# Patient Record
Sex: Female | Born: 1965 | Race: White | Hispanic: No | Marital: Married | Smoking: Never smoker
Health system: Southern US, Community
[De-identification: ages and names within clinical notes are randomized; demographics above are authoritative.]

## PROBLEM LIST (undated history)

## (undated) DIAGNOSIS — J45909 Unspecified asthma, uncomplicated: Secondary | ICD-10-CM

## (undated) DIAGNOSIS — R202 Paresthesia of skin: Secondary | ICD-10-CM

## (undated) DIAGNOSIS — M722 Plantar fascial fibromatosis: Secondary | ICD-10-CM

## (undated) DIAGNOSIS — K219 Gastro-esophageal reflux disease without esophagitis: Secondary | ICD-10-CM

## (undated) DIAGNOSIS — R32 Unspecified urinary incontinence: Secondary | ICD-10-CM

## (undated) DIAGNOSIS — G540 Brachial plexus disorders: Secondary | ICD-10-CM

## (undated) DIAGNOSIS — G629 Polyneuropathy, unspecified: Secondary | ICD-10-CM

## (undated) DIAGNOSIS — M1990 Unspecified osteoarthritis, unspecified site: Secondary | ICD-10-CM

## (undated) DIAGNOSIS — Z8614 Personal history of Methicillin resistant Staphylococcus aureus infection: Secondary | ICD-10-CM

## (undated) DIAGNOSIS — E785 Hyperlipidemia, unspecified: Secondary | ICD-10-CM

## (undated) DIAGNOSIS — O039 Complete or unspecified spontaneous abortion without complication: Secondary | ICD-10-CM

## (undated) DIAGNOSIS — T7840XA Allergy, unspecified, initial encounter: Secondary | ICD-10-CM

## (undated) DIAGNOSIS — I639 Cerebral infarction, unspecified: Secondary | ICD-10-CM

## (undated) HISTORY — DX: Personal history of Methicillin resistant Staphylococcus aureus infection: Z86.14

## (undated) HISTORY — DX: Unspecified asthma, uncomplicated: J45.909

## (undated) HISTORY — DX: Brachial plexus disorders: G54.0

## (undated) HISTORY — DX: Plantar fascial fibromatosis: M72.2

## (undated) HISTORY — DX: Cerebral infarction, unspecified: I63.9

## (undated) HISTORY — DX: Gastro-esophageal reflux disease without esophagitis: K21.9

## (undated) HISTORY — PX: WISDOM TOOTH EXTRACTION: SHX21

## (undated) HISTORY — DX: Unspecified osteoarthritis, unspecified site: M19.90

## (undated) HISTORY — DX: Unspecified urinary incontinence: R32

## (undated) HISTORY — DX: Paresthesia of skin: R20.2

## (undated) HISTORY — DX: Allergy, unspecified, initial encounter: T78.40XA

## (undated) HISTORY — DX: Complete or unspecified spontaneous abortion without complication: O03.9

## (undated) HISTORY — DX: Hyperlipidemia, unspecified: E78.5

---
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
## 2009-03-13 ENCOUNTER — Ambulatory Visit: Payer: Self-pay

## 2009-08-10 HISTORY — PX: DILATION AND CURETTAGE OF UTERUS: SHX78

## 2009-12-16 ENCOUNTER — Ambulatory Visit: Payer: Self-pay | Admitting: Obstetrics and Gynecology

## 2009-12-16 LAB — CONVERTED CEMR LAB
Basophils Absolute: 0.1 10*3/uL (ref 0.0–0.1)
Basophils Relative: 1 % (ref 0–1)
Hepatitis B Surface Ag: NEGATIVE
Lymphocytes Relative: 23 % (ref 12–46)
Lymphs Abs: 1.6 10*3/uL (ref 0.7–4.0)
MCHC: 32.6 g/dL (ref 30.0–36.0)
MCV: 97.4 fL (ref 78.0–100.0)
Monocytes Absolute: 0.4 10*3/uL (ref 0.1–1.0)
Monocytes Relative: 5 % (ref 3–12)
Neutro Abs: 4.7 10*3/uL (ref 1.7–7.7)
RBC: 4.25 M/uL (ref 3.87–5.11)
RDW: 13.8 % (ref 11.5–15.5)
Rh Type: NEGATIVE
Rubella: 53.8 intl units/mL — ABNORMAL HIGH
WBC: 7.2 10*3/uL (ref 4.0–10.5)

## 2009-12-23 ENCOUNTER — Ambulatory Visit (HOSPITAL_COMMUNITY): Admission: RE | Admit: 2009-12-23 | Discharge: 2009-12-23 | Payer: Self-pay | Admitting: Family Medicine

## 2009-12-25 ENCOUNTER — Ambulatory Visit: Payer: Self-pay | Admitting: Obstetrics & Gynecology

## 2009-12-27 ENCOUNTER — Ambulatory Visit (HOSPITAL_COMMUNITY): Admission: RE | Admit: 2009-12-27 | Discharge: 2009-12-27 | Payer: Self-pay | Admitting: Obstetrics & Gynecology

## 2009-12-30 ENCOUNTER — Ambulatory Visit: Payer: Self-pay | Admitting: Obstetrics and Gynecology

## 2010-01-09 ENCOUNTER — Ambulatory Visit: Payer: Self-pay | Admitting: Obstetrics and Gynecology

## 2010-01-09 ENCOUNTER — Ambulatory Visit (HOSPITAL_COMMUNITY): Admission: RE | Admit: 2010-01-09 | Discharge: 2010-01-09 | Payer: Self-pay | Admitting: Obstetrics and Gynecology

## 2010-01-13 ENCOUNTER — Ambulatory Visit: Payer: Self-pay | Admitting: Family Medicine

## 2010-01-13 ENCOUNTER — Ambulatory Visit (HOSPITAL_COMMUNITY): Admission: RE | Admit: 2010-01-13 | Discharge: 2010-01-13 | Payer: Self-pay | Admitting: Family Medicine

## 2010-02-03 ENCOUNTER — Ambulatory Visit: Payer: Self-pay | Admitting: Obstetrics and Gynecology

## 2010-03-18 ENCOUNTER — Ambulatory Visit: Payer: Self-pay | Admitting: Obstetrics and Gynecology

## 2010-10-24 ENCOUNTER — Encounter: Payer: Self-pay | Admitting: Physician Assistant

## 2010-10-27 LAB — CBC
HCT: 37.2 % (ref 36.0–46.0)
Hemoglobin: 12.9 g/dL (ref 12.0–15.0)
MCHC: 34.7 g/dL (ref 30.0–36.0)
WBC: 7 10*3/uL (ref 4.0–10.5)

## 2010-10-27 LAB — RH IMMUNE GLOB WKUP(>/=20WKS)(NOT WOMEN'S HOSP)
Antibody Screen: NEGATIVE
Fetal Screen: NEGATIVE

## 2010-11-09 ENCOUNTER — Encounter: Payer: Self-pay | Admitting: Physician Assistant

## 2010-12-23 NOTE — Assessment & Plan Note (Signed)
NAMEAXEL, MEAS                ACCOUNT NO.:  [id number]   MEDICAL RECORD NO.:  [id number]          PATIENT TYPE:  POB   LOCATION:  [facility]:  Andrew Au, MD        DATE OF BIRTH:  [birth year]   DATE OF SERVICE:  12/30/2009                                  CLINIC NOTE   The patient is a 45 year old Caucasian female gravida 2, para 0-0-1-0  who had a termination with her first pregnancy, came in on May 8 for her  initial OB visit and the ultrasound showed no cardiac motion, need to  follow up.  On May 20, she repeated the ultrasound and was sent home  without the technician having notified the doctor on-call who was  present in the hospital that this patient had an abnormal ultrasound and  an obvious fetal demise.  The patient by dates should have been 8+  weeks.  The fetus appeared to be 6 weeks 5 days with no cardiac activity  on the second followup.  The patient came in today, we saw her, and  discussed her options with her.  I have given her the 3 options of a D  and C, Cytotec, or waiting.  I told her the risks and disadvantages of  all of those and this was a significant surprise, I think to the couple,  so they wanted some time to think about their options.  I have described  the Cytotec how to take it.  I have given her that.  I have also since  she want to make absolute sure that we were certain, I have had them  draw a quantitative beta HCG and if she wants another followup in 48  hours, we will get that for her since she may need more convincing.  On  the other hand in the privacy at her home, will be able to make their  own decision and if she calls and decides on a D and C, we will go ahead  and get that scheduled.   IMPRESSION:  Missed abortion approximately 6 weeks 5 days.  Optional  treatment discussed with the patient along with possible complications  involved.           ______________________________  Andrew Au, MD     PR/MEDQ  D:  12/30/2009  T:  12/31/2009  Job:  [id number]

## 2011-08-11 DIAGNOSIS — O039 Complete or unspecified spontaneous abortion without complication: Secondary | ICD-10-CM

## 2011-08-11 HISTORY — DX: Complete or unspecified spontaneous abortion without complication: O03.9

## 2012-01-20 ENCOUNTER — Ambulatory Visit: Payer: Self-pay | Admitting: Obstetrics and Gynecology

## 2013-04-04 ENCOUNTER — Ambulatory Visit: Payer: Self-pay | Admitting: General Practice

## 2014-02-19 ENCOUNTER — Ambulatory Visit: Payer: Self-pay | Admitting: General Practice

## 2014-04-05 ENCOUNTER — Ambulatory Visit: Payer: Self-pay | Admitting: General Practice

## 2015-06-09 ENCOUNTER — Other Ambulatory Visit: Payer: Self-pay | Admitting: Physician Assistant

## 2015-06-12 ENCOUNTER — Telehealth: Payer: Self-pay | Admitting: Physician Assistant

## 2015-06-12 DIAGNOSIS — J302 Other seasonal allergic rhinitis: Secondary | ICD-10-CM

## 2015-06-12 MED ORDER — MONTELUKAST SODIUM 10 MG PO TABS: 10.0000 mg | ORAL_TABLET | Freq: Every day | ORAL | Status: DC

## 2015-06-12 NOTE — Telephone Encounter (Signed)
Pt called and needs med refill for singulair, approved and sent to [facility]

## 2015-08-01 ENCOUNTER — Encounter: Payer: Self-pay | Admitting: Physician Assistant

## 2015-08-01 ENCOUNTER — Ambulatory Visit: Payer: Self-pay | Admitting: Physician Assistant

## 2015-08-01 VITALS — BP 119/70 | HR 96 | Temp 98.3°F

## 2015-08-01 DIAGNOSIS — J069 Acute upper respiratory infection, unspecified: Secondary | ICD-10-CM

## 2015-08-01 MED ORDER — AZITHROMYCIN 250 MG PO TABS: ORAL_TABLET | ORAL | Status: DC

## 2015-08-01 NOTE — Progress Notes (Signed)
S: C/o runny nose and congestion for 6 days, no fever, chills, cp/sob, v/d; mucus was green this am but clear throughout the day, cough is sporadic, deep and burns in chest, cough is worse at night, husband has same sx  Using albuterol, advair, zyrtec without relief  O: PE: perrl eomi, normocephalic, tms dull, nasal mucosa red and swollen, throat injected, neck supple no lymph, lungs c t a, cv rrr, neuro intact  A:  Acute  uri   P: zpack, tussionex 131m nr; drink fluids, continue regular meds , use otc meds of choice, return if not improving in 5 days, return earlier if worsening

## 2015-08-09 ENCOUNTER — Encounter: Payer: Self-pay | Admitting: Physician Assistant

## 2015-08-09 ENCOUNTER — Ambulatory Visit: Payer: Self-pay | Admitting: Physician Assistant

## 2015-08-09 VITALS — BP 119/70 | HR 73 | Temp 98.1°F

## 2015-08-09 DIAGNOSIS — J069 Acute upper respiratory infection, unspecified: Secondary | ICD-10-CM

## 2015-08-09 MED ORDER — AMOXICILLIN 875 MG PO TABS: 875.0000 mg | ORAL_TABLET | Freq: Two times a day (BID) | ORAL | Status: DC

## 2015-08-09 MED ORDER — FLUCONAZOLE 150 MG PO TABS: 150.0000 mg | ORAL_TABLET | Freq: Once | ORAL | Status: DC

## 2015-08-09 NOTE — Progress Notes (Signed)
S: continued cough, states was feeling a lot better , finished zpack few days ago, mucus is getting more clear, no fever/chills, cp/sob; states feels better today than she did yesterday  O: vitals wnl, nad, lungs c t a, cv rrr  A: continue uri  P: reassurance, if worsening get amoxil filled

## 2015-11-26 ENCOUNTER — Encounter: Payer: Self-pay | Admitting: Physician Assistant

## 2015-11-26 ENCOUNTER — Ambulatory Visit: Payer: Self-pay | Admitting: Physician Assistant

## 2015-11-26 VITALS — BP 119/80 | HR 65 | Temp 98.7°F

## 2015-11-26 DIAGNOSIS — R509 Fever, unspecified: Secondary | ICD-10-CM

## 2015-11-26 DIAGNOSIS — R062 Wheezing: Secondary | ICD-10-CM

## 2015-11-26 DIAGNOSIS — J209 Acute bronchitis, unspecified: Secondary | ICD-10-CM

## 2015-11-26 LAB — POCT INFLUENZA A/B
INFLUENZA A, POC: NEGATIVE
INFLUENZA B, POC: NEGATIVE

## 2015-11-26 MED ORDER — AZITHROMYCIN 250 MG PO TABS: ORAL_TABLET | ORAL | Status: DC

## 2015-11-26 MED ORDER — FLUTICASONE-SALMETEROL 115-21 MCG/ACT IN AERO: 2.0000 | INHALATION_SPRAY | Freq: Two times a day (BID) | RESPIRATORY_TRACT | Status: DC

## 2015-11-26 MED ORDER — IPRATROPIUM-ALBUTEROL 0.5-2.5 (3) MG/3ML IN SOLN
3.0000 mL | Freq: Once | RESPIRATORY_TRACT | Status: AC
  Administered 2015-11-26: 3 mL via RESPIRATORY_TRACT

## 2015-11-26 MED ORDER — METHYLPREDNISOLONE 4 MG PO TBPK: ORAL_TABLET | ORAL | Status: DC

## 2015-11-26 MED ORDER — ALBUTEROL SULFATE HFA 108 (90 BASE) MCG/ACT IN AERS: 2.0000 | INHALATION_SPRAY | RESPIRATORY_TRACT | Status: DC | PRN

## 2015-11-26 NOTE — Progress Notes (Signed)
S: C/o cough and congestion with wheezing and chest pain, chest is sore from coughing, did have fever, chills on sunday, mucus is green,  keeping pt awake at night;  denies cardiac type chest pain or sob, v/d, abd pain Remainder ros neg  O: vitals wnl, nad, tms clear, throat injected, neck supple no lymph, lungs with wheezing, cv rrr, neuro intact, cough is congested; svn duoneb given, lungs c t a  A:  Acute bronchitis   P:  rx medication: zpack, medrol dose pack, refill on albuterol and advair, use otc meds, tylenol or motrin as needed for fever/chills, return if not better in 3 -5 days, return earlier if worsening

## 2016-04-28 ENCOUNTER — Ambulatory Visit: Payer: Self-pay | Admitting: Physician Assistant

## 2016-04-28 ENCOUNTER — Encounter: Payer: Self-pay | Admitting: Physician Assistant

## 2016-04-28 VITALS — BP 119/70 | HR 85 | Temp 98.4°F

## 2016-04-28 DIAGNOSIS — L259 Unspecified contact dermatitis, unspecified cause: Secondary | ICD-10-CM

## 2016-04-28 MED ORDER — METHYLPREDNISOLONE 4 MG PO TBPK: ORAL_TABLET | ORAL | 0 refills | Status: DC

## 2016-04-28 NOTE — Progress Notes (Signed)
S: c/o rash on left forearm, itchy, x 4 weeks, area is still itching, unsure why, no pustules or drainage, no new exposures  O: vitals wnl, nad, skin with deep scratch mark type wounds, some redness from scratching, no pustules, drainage, or vesicles, no blisters, n/v intact  A: itching, ?contact dermatitis  P: medrol dose pack F/u with dermatology if not better in 5 days

## 2016-05-27 ENCOUNTER — Other Ambulatory Visit: Payer: Self-pay | Admitting: Physician Assistant

## 2016-05-27 DIAGNOSIS — J302 Other seasonal allergic rhinitis: Secondary | ICD-10-CM

## 2016-06-08 ENCOUNTER — Other Ambulatory Visit: Payer: Self-pay | Admitting: Emergency Medicine

## 2016-06-08 MED ORDER — FLUTICASONE-SALMETEROL 45-21 MCG/ACT IN AERO: 2.0000 | INHALATION_SPRAY | Freq: Two times a day (BID) | RESPIRATORY_TRACT | 12 refills | Status: DC

## 2016-09-01 ENCOUNTER — Ambulatory Visit: Payer: Self-pay | Admitting: Physician Assistant

## 2016-09-14 ENCOUNTER — Ambulatory Visit (INDEPENDENT_AMBULATORY_CARE_PROVIDER_SITE_OTHER): Payer: Managed Care, Other (non HMO) | Admitting: Family

## 2016-09-14 ENCOUNTER — Encounter: Payer: Self-pay | Admitting: Family

## 2016-09-14 DIAGNOSIS — J4520 Mild intermittent asthma, uncomplicated: Secondary | ICD-10-CM | POA: Diagnosis not present

## 2016-09-14 DIAGNOSIS — E663 Overweight: Secondary | ICD-10-CM

## 2016-09-14 DIAGNOSIS — J45909 Unspecified asthma, uncomplicated: Secondary | ICD-10-CM | POA: Insufficient documentation

## 2016-09-14 NOTE — Progress Notes (Signed)
Subjective:    Patient ID: Denise Gamble, female    DOB: 04/01/1966, 51 y.o.   MRN: [id number]  CC: Denise Gamble is a 51 y.o. female who presents today to establish care.    HPI: Prior care had been with employee health through work.   Asthma- stable. has done allergy shots with ENT in the past; on singulair QD; using inhalers prn; diagnosed as adult  Remains frustrated by her weight. Regular exercise. Trying to eat more clean.     HISTORY:  Past Medical History:  Diagnosis Date  . Asthma   . Miscarriage 2013   multiple    Past Surgical History:  Procedure Laterality Date  . LYMPHADENECTOMY     Family History  Problem Relation Age of Onset  . Heart disease Mother 17    epstein anolmay   . Colon cancer Father 2  . Heart disease Brother     Allergies: Patient has no known allergies. Current Outpatient Prescriptions on File Prior to Visit  Medication Sig Dispense Refill  . albuterol (PROAIR HFA) 108 (90 Base) MCG/ACT inhaler Inhale 2 puffs into the lungs every 4 (four) hours as needed for wheezing or shortness of breath. 1 Inhaler 12  . cetirizine (ZYRTEC) 10 MG tablet Take 10 mg by mouth.    . fluticasone-salmeterol (ADVAIR HFA) 115-21 MCG/ACT inhaler Inhale 2 puffs into the lungs 2 (two) times daily. 1 Inhaler 12  . fluticasone-salmeterol (ADVAIR HFA) 45-21 MCG/ACT inhaler Inhale 2 puffs into the lungs 2 (two) times daily. 1 Inhaler 12  . montelukast (SINGULAIR) 10 MG tablet TAKE ONE TABLET BY MOUTH AT BEDTIME 30 tablet 11   No current facility-administered medications on file prior to visit.     Social History  Substance Use Topics  . Smoking status: Unknown If Ever Smoked  . Smokeless tobacco: Never Used  . Alcohol use No    Review of Systems  Constitutional: Negative for chills and fever.  Respiratory: Negative for cough, shortness of breath and wheezing.   Cardiovascular: Negative for chest pain and palpitations.  Gastrointestinal: Negative for  nausea and vomiting.      Objective:    BP 104/68   Pulse 93   Temp 98 F (36.7 C) (Oral)   Wt 197 lb 3.2 oz (89.4 kg)  BP Readings from Last 3 Encounters:  09/14/16 104/68  04/28/16 119/70  11/26/15 119/80   Wt Readings from Last 3 Encounters:  09/14/16 197 lb 3.2 oz (89.4 kg)    Physical Exam  Constitutional: She appears well-developed and well-nourished.  Eyes: Conjunctivae are normal.  Cardiovascular: Normal rate, regular rhythm, normal heart sounds and normal pulses.   Pulmonary/Chest: Effort normal and breath sounds normal. She has no wheezes. She has no rhonchi. She has no rales.  Neurological: She is alert.  Skin: Skin is warm and dry.  Psychiatric: She has a normal mood and affect. Her speech is normal and behavior is normal. Thought content normal.  Vitals reviewed.      Assessment & Plan:   Problem List Items Addressed This Visit      Respiratory   Asthma    Stable.         Other   Overweight    Discussed lifestyle medications. Information provided. Will follow and do fasting labwork when she returns for cpe.           I have discontinued Ms. Kinzler's azithromycin and methylPREDNISolone. I am also having her maintain her cetirizine, albuterol,  fluticasone-salmeterol, montelukast, and fluticasone-salmeterol.   No orders of the defined types were placed in this encounter.   Return precautions given.   Risks, benefits, and alternatives of the medications and treatment plan prescribed today were discussed, and patient expressed understanding.   Education regarding symptom management and diagnosis given to patient on AVS.  Continue to follow with Mable Paris, FNP for routine health maintenance.   Denise Gamble and I agreed with plan.   Mable Paris, FNP

## 2016-09-14 NOTE — Patient Instructions (Signed)
Pleasure meeting you     This is  Dr. Lupita Dawn  example of a  "Low GI"  Diet:  It will allow you to lose 4 to 8  lbs  per month if you follow it carefully.  Your goal with exercise is a minimum of 30 minutes of aerobic exercise 5 days per week (Walking does not count once it becomes easy!)    All of the foods can be found at grocery stores and in bulk at [employer].  The Atkins protein bars and shakes are available in more varieties at Target, [employer] and [place].     7 AM Breakfast:  Choose from the following:  Low carbohydrate Protein  Shakes (I recommend the  Premier Protein chocolate shakes,  EAS AdvantEdge "Carb Control" shakes  Or the Atkins shakes all are under 3 net carbs)     a scrambled egg/bacon/cheese burrito made with Mission's "carb balance" whole wheat tortilla  (about 10 net carbs )  Regulatory affairs officer (basically a quiche without the pastry crust) that is eaten cold and very convenient way to get your eggs.  8 carbs)  If you make your own protein shakes, avoid bananas and pineapple,  And use low carb greek yogurt or original /unsweetened almond or soy milk    Avoid cereal and bananas, oatmeal and cream of wheat and grits. They are loaded with carbohydrates!   10 AM: high protein snack:  Protein bar by Atkins (the snack size, under 200 cal, usually < 6 net carbs).    A stick of cheese:  Around 1 carb,  100 cal     Dannon Light n Fit [place] Yogurt  (80 cal, 8 carbs)  Other so called "protein bars" and Greek yogurts tend to be loaded with carbohydrates.  Remember, in food advertising, the word "energy" is synonymous for " carbohydrate."  Lunch:   A Sandwich using the bread choices listed, Can use any  Eggs,  lunchmeat, grilled meat or canned tuna), avocado, regular mayo/mustard  and cheese.  A Salad using blue cheese, ranch,  Goddess or vinagrette,  Avoid taco shells, croutons or "confetti" and no "candied nuts" but regular nuts OK.   No pretzels, nabs   or chips.  Pickles and miniature sweet peppers are a good low carb alternative that provide a "crunch"  The bread is the only source of carbohydrate in a sandwich and  can be decreased by trying some of the attached alternatives to traditional loaf bread   Avoid "Low fat dressings, as well as [place] dressings They are loaded with sugar!   3 PM/ Mid day  Snack:  Consider  1 ounce of  almonds, walnuts, pistachios, pecans, peanuts,  Macadamia nuts or a nut medley.  Avoid "granola and granola bars "  Mixed nuts are ok in moderation as long as there are no raisins,  cranberries or dried fruit.   KIND bars are OK if you get the low glycemic index variety   Try the prosciutto/mozzarella cheese sticks by F[employer]  In deli /backery section   High protein      6 PM  Dinner:     Meat/fowl/fish with a green salad, and either broccoli, cauliflower, green beans, spinach, brussel sprouts or  Lima beans. DO NOT BREAD THE PROTEIN!!      There is a low carb pasta by Dreamfield's that is acceptable and tastes great: only 5 digestible carbs/serving.( All grocery stores but BJs carry  it ) Several ready made meals are available low carb:   Try Michel Angelo's chicken piccata or chicken or eggplant parm over low carb pasta.(Lowes and BJs)   Marjory Lies Sanchez's "Carnitas" (pulled pork, no sauce,  0 carbs) or his beef pot roast to make a dinner burrito (at BJ's)  Pesto over low carb pasta (bj's sells a good quality pesto in the center refrigerated section of the deli   Try satueeing  Cheral Marker with mushroooms as a good side   [employer] Giant makes a mashed cauliflower that tastes like mashed potatoes  Whole wheat pasta is still full of digestible carbs and  Not as low in glycemic index as Dreamfield's.   Brown rice is still rice,  So skip the rice and noodles if you eat [place] or [place] (or at least limit to 1/2 cup)  9 PM snack :   Breyer's "low carb" fudgsicle or  ice cream bar (Carb Smart line),  or  Weight Watcher's ice cream bar , or another "no sugar added" ice cream;  a serving of fresh berries/cherries with whipped cream   Cheese or DANNON'S LlGHT N FIT GREEK YOGURT  8 ounces of [place] unsweetened almond/cococunut milk    Treat yourself to a parfait made with whipped cream blueberiies, walnuts and vanilla greek yogurt  Avoid bananas, pineapple, grapes  and watermelon on a regular basis because they are high in sugar.  THINK OF THEM AS DESSERT  Remember that snack Substitutions should be less than 10 NET carbs per serving and meals < 20 carbs. Remember to subtract fiber grams to get the "net carbs."

## 2016-09-15 ENCOUNTER — Encounter: Payer: Self-pay | Admitting: Family

## 2016-09-15 DIAGNOSIS — E663 Overweight: Secondary | ICD-10-CM | POA: Insufficient documentation

## 2016-09-15 NOTE — Assessment & Plan Note (Signed)
Discussed lifestyle medications. Information provided. Will follow and do fasting labwork when she returns for cpe.

## 2016-09-15 NOTE — Assessment & Plan Note (Signed)
Stable

## 2016-09-15 NOTE — Assessment & Plan Note (Signed)
>>  ASSESSMENT AND PLAN FOR OVERWEIGHT WRITTEN ON 09/15/2016  7:44 AM BY ARNETT, Lyn Records, FNP  Discussed lifestyle medications. Information provided. Will follow and do fasting labwork when she returns for cpe.

## 2016-11-05 ENCOUNTER — Ambulatory Visit: Payer: Self-pay | Admitting: Physician Assistant

## 2016-11-05 ENCOUNTER — Encounter: Payer: Self-pay | Admitting: Physician Assistant

## 2016-11-05 VITALS — BP 110/70 | HR 85 | Temp 98.4°F

## 2016-11-05 DIAGNOSIS — M722 Plantar fascial fibromatosis: Secondary | ICD-10-CM

## 2016-11-05 NOTE — Progress Notes (Signed)
S: c/o left foot pain, no known injury, pain started yesterday, hurts to stand after sitting for awhile, no swelling or redness  O: vitals wnl, nad, left foot tender along heel, full rom, skin intact, no redness or swelling, no fb noted, n/v intact  A: plantar fasciitis  P: otc advil, ice, stretches, wear shoes, do not go barefoot or wear flip flops, discussed proper shoe support

## 2016-11-27 ENCOUNTER — Encounter: Payer: Self-pay | Admitting: Podiatry

## 2016-11-27 ENCOUNTER — Ambulatory Visit (INDEPENDENT_AMBULATORY_CARE_PROVIDER_SITE_OTHER): Payer: Managed Care, Other (non HMO) | Admitting: Podiatry

## 2016-11-27 DIAGNOSIS — M79672 Pain in left foot: Secondary | ICD-10-CM | POA: Diagnosis not present

## 2016-11-27 DIAGNOSIS — M722 Plantar fascial fibromatosis: Secondary | ICD-10-CM

## 2016-11-27 MED ORDER — MELOXICAM 15 MG PO TABS: 15.0000 mg | ORAL_TABLET | Freq: Every day | ORAL | 0 refills | Status: DC

## 2016-11-29 NOTE — Progress Notes (Signed)
   Subjective: Patient presents today for stabbing pain and tenderness in the plantar aspect of the left foot and posterior heel. Patient states the foot pain has been hurting for about 4 weeks now. Patient states that it hurts in the mornings with the first steps out of bed. He also reports intermittent pain of the left ankle secondary to a sprain about two years ago. He also complains of numbness of the left great toe as well and reports h/o gout in that toe. Patient presents today for further treatment and evaluation  Objective: Physical Exam General: The patient is alert and oriented x3 in no acute distress.  Dermatology: Skin is warm, dry and supple bilateral lower extremities. Negative for open lesions or macerations bilateral.   Vascular: Dorsalis Pedis and Posterior Tibial pulses palpable bilateral.  Capillary fill time is immediate to all digits.  Neurological: Epicritic and protective threshold intact bilateral.   Musculoskeletal: Tenderness to palpation at the medial calcaneal tubercale and through the insertion of the plantar fascia of the left foot. All other joints range of motion within normal limits bilateral. Strength 5/5 in all groups bilateral.    Assessment: 1. Plantar fasciitis left foot 2. Pain in left foot 3. h/o gout-left great toe 4. h/o left ankle sprain two years ago  Plan of Care:   1. Patient evaluated. Xrays reviewed.   2. Injection of 0.5cc Celestone soluspan injected into the left plantar fascia.  3. Instructed patient regarding therapies and modalities at home to alleviate symptoms.  4. Rx for meloxicam 81m PO given to patient.  5. Plantar fascial band(s) dispensed. 6. Return to clinic in 4 weeks.     BEdrick Kins DPM [facility]  Dr. BEdrick Kins DAvinger                                       [place] [address]               Office [phone] Fax [phone]

## 2016-12-06 MED ORDER — BETAMETHASONE SOD PHOS & ACET 6 (3-3) MG/ML IJ SUSP: 3.0000 mg | Freq: Once | INTRAMUSCULAR | Status: DC

## 2016-12-14 ENCOUNTER — Encounter: Payer: Self-pay | Admitting: Family

## 2016-12-14 ENCOUNTER — Other Ambulatory Visit (HOSPITAL_COMMUNITY)
Admission: RE | Admit: 2016-12-14 | Discharge: 2016-12-14 | Disposition: A | Discharge status: Home / Self Care | Payer: Managed Care, Other (non HMO) | Source: Ambulatory Visit | Attending: Family | Admitting: Family

## 2016-12-14 ENCOUNTER — Ambulatory Visit (INDEPENDENT_AMBULATORY_CARE_PROVIDER_SITE_OTHER): Payer: Managed Care, Other (non HMO) | Admitting: Family

## 2016-12-14 VITALS — BP 118/80 | HR 79 | Temp 98.1°F | Wt 202.2 lb

## 2016-12-14 DIAGNOSIS — Z0000 Encounter for general adult medical examination without abnormal findings: Secondary | ICD-10-CM | POA: Diagnosis present

## 2016-12-14 DIAGNOSIS — G6289 Other specified polyneuropathies: Secondary | ICD-10-CM

## 2016-12-14 DIAGNOSIS — G629 Polyneuropathy, unspecified: Secondary | ICD-10-CM | POA: Insufficient documentation

## 2016-12-14 LAB — CBC WITH DIFFERENTIAL/PLATELET
BASOS ABS: 0.1 10*3/uL (ref 0.0–0.1)
Basophils Relative: 1 % (ref 0.0–3.0)
EOS ABS: 0.2 10*3/uL (ref 0.0–0.7)
Eosinophils Relative: 2.5 % (ref 0.0–5.0)
HCT: 40 % (ref 36.0–46.0)
HEMOGLOBIN: 13.2 g/dL (ref 12.0–15.0)
Lymphocytes Relative: 21.2 % (ref 12.0–46.0)
Lymphs Abs: 1.3 10*3/uL (ref 0.7–4.0)
MCHC: 33 g/dL (ref 30.0–36.0)
MCV: 98.3 fl (ref 78.0–100.0)
MONO ABS: 0.4 10*3/uL (ref 0.1–1.0)
Monocytes Relative: 6.5 % (ref 3.0–12.0)
Neutro Abs: 4.3 10*3/uL (ref 1.4–7.7)
Neutrophils Relative %: 68.8 % (ref 43.0–77.0)
Platelets: 229 10*3/uL (ref 150.0–400.0)
RBC: 4.07 Mil/uL (ref 3.87–5.11)
RDW: 13.7 % (ref 11.5–15.5)
WBC: 6.3 10*3/uL (ref 4.0–10.5)

## 2016-12-14 LAB — COMPREHENSIVE METABOLIC PANEL
ALBUMIN: 4.2 g/dL (ref 3.5–5.2)
ALT: 14 U/L (ref 0–35)
AST: 18 U/L (ref 0–37)
Alkaline Phosphatase: 52 U/L (ref 39–117)
BILIRUBIN TOTAL: 0.6 mg/dL (ref 0.2–1.2)
BUN: 13 mg/dL (ref 6–23)
CALCIUM: 9.4 mg/dL (ref 8.4–10.5)
CO2: 28 meq/L (ref 19–32)
Chloride: 105 mEq/L (ref 96–112)
Creatinine, Ser: 0.84 mg/dL (ref 0.40–1.20)
GFR: 76.7 mL/min (ref >60.00)
Glucose, Bld: 102 mg/dL — ABNORMAL HIGH (ref 70–99)
Potassium: 4.6 mEq/L (ref 3.5–5.1)
SODIUM: 139 meq/L (ref 135–145)
Total Protein: 7 g/dL (ref 6.0–8.3)

## 2016-12-14 LAB — LIPID PANEL
Cholesterol: 195 mg/dL (ref 0–200)
HDL: 57.2 mg/dL (ref >39.00)
LDL CALC: 123 mg/dL — AB (ref 0–99)
NONHDL: 137.87
Total CHOL/HDL Ratio: 3
Triglycerides: 72 mg/dL (ref 0.0–149.0)
VLDL: 14.4 mg/dL (ref 0.0–40.0)

## 2016-12-14 LAB — B12 AND FOLATE PANEL: Vitamin B-12: 1004 pg/mL — ABNORMAL HIGH (ref 211–911)

## 2016-12-14 LAB — HEMOGLOBIN A1C: HEMOGLOBIN A1C: 5.3 % (ref 4.6–6.5)

## 2016-12-14 LAB — TSH: TSH: 2.22 u[IU]/mL (ref 0.35–4.50)

## 2016-12-14 LAB — VITAMIN D 25 HYDROXY (VIT D DEFICIENCY, FRACTURES): VITD: 43.74 ng/mL (ref 30.00–100.00)

## 2016-12-14 MED ORDER — PNEUMOCOCCAL VAC POLYVALENT 25 MCG/0.5ML IJ INJ
0.5000 mL | INJECTION | Freq: Once | INTRAMUSCULAR | Status: AC
  Administered 2016-12-14: 0.5 mL via INTRAMUSCULAR

## 2016-12-14 NOTE — Progress Notes (Signed)
Pre visit review using our clinic review tool, if applicable. No additional management support is needed unless otherwise documented below in the visit note. 

## 2016-12-14 NOTE — Progress Notes (Signed)
Subjective:    Patient ID: Denise Gamble, female    DOB: 12-29-1965, 51 y.o.   MRN: [id number]  CC: Denise Gamble is a 51 y.o. female who presents today for physical exam.    HPI: Overall feeling well.    Left big toe numbness for past 6 weeks, improving.No triggers. Hasn't tried any medication.  Following with podiatry  For plantar fascitis. No pain in calves with ambulation. Notes drinking red bull with b6 daily.       Colorectal Cancer Screening: No early family history. Father had colon cancer at older age. Breast Cancer Screening: Mammogram due Cervical Cancer Screening: due Bone Health screening/DEXA for 65+: No increased fracture risk. Defer screening at this time. Lung Cancer Screening: Doesn't have 30 year pack year history and age > 63 years.       Tetanus - due        Pneumococcal - Candidate for.   Labs: Screening labs today. Exercise: Gets regular exercise.  Alcohol use: none Smoking/tobacco use: Nonsmoker.  Regular dental exams: UTD Wears seat belt: Yes. Skin: no new lesions. No h/o skin cancer; no new lesions.    HISTORY:  Past Medical History:  Diagnosis Date  . Asthma   . Miscarriage 2013   multiple   . Thoracic outlet syndrome     Past Surgical History:  Procedure Laterality Date  . LYMPHADENECTOMY     Family History  Problem Relation Age of Onset  . Heart disease Mother 61    epstein anolmay   . Colon cancer Father 61  . Skin cancer Father     ? thinks melanoma?   . Leukemia Father     AAL  . Heart disease Brother   . Coronary artery disease Sister       ALLERGIES: Patient has no known allergies.  Current Outpatient Prescriptions on File Prior to Visit  Medication Sig Dispense Refill  . albuterol (PROAIR HFA) 108 (90 Base) MCG/ACT inhaler Inhale 2 puffs into the lungs every 4 (four) hours as needed for wheezing or shortness of breath. 1 Inhaler 12  . cetirizine (ZYRTEC) 10 MG tablet Take 10 mg by mouth.    .  fluticasone-salmeterol (ADVAIR HFA) 45-21 MCG/ACT inhaler Inhale 2 puffs into the lungs 2 (two) times daily. 1 Inhaler 12  . meloxicam (MOBIC) 15 MG tablet Take 1 tablet (15 mg total) by mouth daily. 30 tablet 0  . montelukast (SINGULAIR) 10 MG tablet TAKE ONE TABLET BY MOUTH AT BEDTIME 30 tablet 11   Current Facility-Administered Medications on File Prior to Visit  Medication Dose Route Frequency Provider Last Rate Last Dose  . betamethasone acetate-betamethasone sodium phosphate (CELESTONE) injection 3 mg  3 mg Intramuscular Once Edrick Kins, DPM        Social History  Substance Use Topics  . Smoking status: Never Smoker  . Smokeless tobacco: Never Used  . Alcohol use No    Review of Systems  Constitutional: Negative for chills, fever and unexpected weight change.  HENT: Negative for congestion.   Respiratory: Negative for cough.   Cardiovascular: Negative for chest pain, palpitations and leg swelling.  Gastrointestinal: Negative for nausea and vomiting.  Musculoskeletal: Negative for arthralgias and myalgias.  Skin: Negative for rash.  Neurological: Positive for numbness. Negative for headaches.  Hematological: Negative for adenopathy.  Psychiatric/Behavioral: Negative for confusion.      Objective:    BP 118/80   Pulse 79   Temp 98.1 F (36.7 C) (Oral)  Wt 202 lb 3.2 oz (91.7 kg)   SpO2 98%   BP Readings from Last 3 Encounters:  12/14/16 118/80  11/05/16 110/70  09/14/16 104/68   Wt Readings from Last 3 Encounters:  12/14/16 202 lb 3.2 oz (91.7 kg)  09/14/16 197 lb 3.2 oz (89.4 kg)    Physical Exam  Constitutional: She appears well-developed and well-nourished.  Eyes: Conjunctivae are normal.  Neck: No thyroid mass and no thyromegaly present.  Cardiovascular: Normal rate, regular rhythm, normal heart sounds and normal pulses.   Pulmonary/Chest: Effort normal and breath sounds normal. She has no wheezes. She has no rhonchi. She has no rales. Right breast  exhibits no inverted nipple, no mass, no nipple discharge, no skin change and no tenderness. Left breast exhibits no inverted nipple, no mass, no nipple discharge, no skin change and no tenderness. Breasts are symmetrical.  No masses or asymmetry appreciated during CBE.  Genitourinary: Uterus is not enlarged, not fixed and not tender. Cervix exhibits no motion tenderness, no discharge and no friability. Right adnexum displays no mass, no tenderness and no fullness. Left adnexum displays no mass, no tenderness and no fullness.  Genitourinary Comments: Pap performed. No CMT. Unable to appreciated ovaries.  Lymphadenopathy:       Head (right side): No submental, no submandibular, no tonsillar, no preauricular, no posterior auricular and no occipital adenopathy present.       Head (left side): No submental, no submandibular, no tonsillar, no preauricular, no posterior auricular and no occipital adenopathy present.       Right cervical: No superficial cervical, no deep cervical and no posterior cervical adenopathy present.      Left cervical: No superficial cervical, no deep cervical and no posterior cervical adenopathy present.    She has no axillary adenopathy.       Right axillary: No pectoral and no lateral adenopathy present.       Left axillary: No pectoral and no lateral adenopathy present. Neurological: She is alert.  Skin: Skin is warm and dry.  Psychiatric: She has a normal mood and affect. Her speech is normal and behavior is normal. Thought content normal.  Vitals reviewed.      Assessment & Plan:   Problem List Items Addressed This Visit      Nervous and Auditory   Peripheral neuropathy    Discussed peripheral neuropathy of left great toe, pending additional lab studies vitamin deficiency or toxicity of B12 due to her intake of red bull.        Other   Routine physical examination - Primary    Pap and clinical breast exam performed today. Due to family history of colon cancer,  patient I jointly agreed referral for colonoscopy was appropriate. Pneumococcal vaccine given due to history of asthma. Advised tdap vaccine a local pharmacy. Encouraged exercise.       Relevant Medications   pneumococcal 23 valent vaccine (PNU-IMMUNE) injection 0.5 mL (Completed)   Other Relevant Orders   CBC with Differential/Platelet   Comprehensive metabolic panel   Hemoglobin A1c   Lipid panel   Cytology - PAP   TSH   VITAMIN D 25 Hydroxy (Vit-D Deficiency, Fractures)   Ambulatory referral to Gastroenterology   B12 and Folate Panel   Vitamin B6   MM DIGITAL SCREENING BILATERAL       I am having Ms. Abdelaziz maintain her cetirizine, albuterol, montelukast, fluticasone-salmeterol, and meloxicam. We administered pneumococcal 23 valent vaccine. We will continue to administer betamethasone acetate-betamethasone sodium phosphate.  Meds ordered this encounter  Medications  . pneumococcal 23 valent vaccine (PNU-IMMUNE) injection 0.5 mL    Return precautions given.   Risks, benefits, and alternatives of the medications and treatment plan prescribed today were discussed, and patient expressed understanding.   Education regarding symptom management and diagnosis given to patient on AVS.   Continue to follow with Burnard Hawthorne, FNP for routine health maintenance.   Denise Gamble and I agreed with plan.   Mable Paris, FNP

## 2016-12-14 NOTE — Patient Instructions (Addendum)
Labs  tdap local pharmacy  We placed a referral. Mammogram this year. I asked that you call one the below locations and schedule this when it is convenient for you.   If you have dense breasts, you may ask for 3D mammogram over the traditional 2D mammogram as new evidence suggest 3D is superior. Please note that NOT all insurance companies cover 3D and you may have to pay a higher copay. You may call your insurance company to further clarify your benefits.   Options for [place], [place]  * [facility] [place] [place] * Note if you ask for 3D mammogram at this location, you must request [place], [place] location*     Health Maintenance, Female Adopting a healthy lifestyle and getting preventive care can go a long way to promote health and wellness. Talk with your health care provider about what schedule of regular examinations is right for you. This is a good chance for you to check in with your provider about disease prevention and staying healthy. In between checkups, there are plenty of things you can do on your own. Experts have done a lot of research about which lifestyle changes and preventive measures are most likely to keep you healthy. Ask your health care provider for more information. Weight and diet Eat a healthy diet  Be sure to include plenty of vegetables, fruits, low-fat dairy products, and lean protein.  Do not eat a lot of foods high in solid fats, added sugars, or salt.  Get regular exercise. This is one of the most important things you can do for your health.  Most adults should exercise for at least 150 minutes each week. The exercise should increase your heart rate and make you sweat (moderate-intensity exercise).  Most adults should also do strengthening exercises at least twice a week. This is in  addition to the moderate-intensity exercise. Maintain a healthy weight  Body mass index (BMI) is a measurement that can be used to identify possible weight problems. It estimates body fat based on height and weight. Your health care provider can help determine your BMI and help you achieve or maintain a healthy weight.  For females 28 years of age and older:  A BMI below 18.5 is considered underweight.  A BMI of 18.5 to 24.9 is normal.  A BMI of 25 to 29.9 is considered overweight.  A BMI of 30 and above is considered obese. Watch levels of cholesterol and blood lipids  You should start having your blood tested for lipids and cholesterol at 49 years of age, then have this test every 5 years.  You may need to have your cholesterol levels checked more often if:  Your lipid or cholesterol levels are high.  You are older than 49 years of age.  You are at high risk for heart disease. Cancer screening Lung Cancer  Lung cancer screening is recommended for adults 61-36 years old who are at high risk for lung cancer because of a history of smoking.  A yearly low-dose CT scan of the lungs is recommended for people who:  Currently smoke.  Have quit within the past 15 years.  Have at least a 30-pack-year history of smoking. A pack year is smoking an average of one pack of cigarettes a day for 1 year.  Yearly screening should continue until  it has been 15 years since you quit.  Yearly screening should stop if you develop a health problem that would prevent you from having lung cancer treatment. Breast Cancer  Practice breast self-awareness. This means understanding how your breasts normally appear and feel.  It also means doing regular breast self-exams. Let your health care provider know about any changes, no matter how small.  If you are in your 20s or 30s, you should have a clinical breast exam (CBE) by a health care provider every 1-3 years as part of a regular health  exam.  If you are 70 or older, have a CBE every year. Also consider having a breast X-ray (mammogram) every year.  If you have a family history of breast cancer, talk to your health care provider about genetic screening.  If you are at high risk for breast cancer, talk to your health care provider about having an MRI and a mammogram every year.  Breast cancer gene (BRCA) assessment is recommended for women who have family members with BRCA-related cancers. BRCA-related cancers include:  Breast.  Ovarian.  Tubal.  Peritoneal cancers.  Results of the assessment will determine the need for genetic counseling and BRCA1 and BRCA2 testing. Cervical Cancer  Your health care provider may recommend that you be screened regularly for cancer of the pelvic organs (ovaries, uterus, and vagina). This screening involves a pelvic examination, including checking for microscopic changes to the surface of your cervix (Pap test). You may be encouraged to have this screening done every 3 years, beginning at age 16.  For women ages 15-65, health care providers may recommend pelvic exams and Pap testing every 3 years, or they may recommend the Pap and pelvic exam, combined with testing for human papilloma virus (HPV), every 5 years. Some types of HPV increase your risk of cervical cancer. Testing for HPV may also be done on women of any age with unclear Pap test results.  Other health care providers may not recommend any screening for nonpregnant women who are considered low risk for pelvic cancer and who do not have symptoms. Ask your health care provider if a screening pelvic exam is right for you.  If you have had past treatment for cervical cancer or a condition that could lead to cancer, you need Pap tests and screening for cancer for at least 20 years after your treatment. If Pap tests have been discontinued, your risk factors (such as having a new sexual partner) need to be reassessed to determine if  screening should resume. Some women have medical problems that increase the chance of getting cervical cancer. In these cases, your health care provider may recommend more frequent screening and Pap tests. Colorectal Cancer  This type of cancer can be detected and often prevented.  Routine colorectal cancer screening usually begins at 49 years of age and continues through 49 years of age.  Your health care provider may recommend screening at an earlier age if you have risk factors for colon cancer.  Your health care provider may also recommend using home test kits to check for hidden blood in the stool.  A small camera at the end of a tube can be used to examine your colon directly (sigmoidoscopy or colonoscopy). This is done to check for the earliest forms of colorectal cancer.  Routine screening usually begins at age 38.  Direct examination of the colon should be repeated every 5-10 years through 49 years of age. However, you may need to be screened  more often if early forms of precancerous polyps or small growths are found. Skin Cancer  Check your skin from head to toe regularly.  Tell your health care provider about any new moles or changes in moles, especially if there is a change in a mole's shape or color.  Also tell your health care provider if you have a mole that is larger than the size of a pencil eraser.  Always use sunscreen. Apply sunscreen liberally and repeatedly throughout the day.  Protect yourself by wearing long sleeves, pants, a wide-brimmed hat, and sunglasses whenever you are outside. Heart disease, diabetes, and high blood pressure  High blood pressure causes heart disease and increases the risk of stroke. High blood pressure is more likely to develop in:  People who have blood pressure in the high end of the normal range (130-139/85-89 mm Hg).  People who are overweight or obese.  People who are African American.  If you are 39-34 years of age, have your  blood pressure checked every 3-5 years. If you are 71 years of age or older, have your blood pressure checked every year. You should have your blood pressure measured twice-once when you are at a hospital or clinic, and once when you are not at a hospital or clinic. Record the average of the two measurements. To check your blood pressure when you are not at a hospital or clinic, you can use:  An automated blood pressure machine at a pharmacy.  A home blood pressure monitor.  If you are between 59 years and 32 years old, ask your health care provider if you should take aspirin to prevent strokes.  Have regular diabetes screenings. This involves taking a blood sample to check your fasting blood sugar level.  If you are at a normal weight and have a low risk for diabetes, have this test once every three years after 49 years of age.  If you are overweight and have a high risk for diabetes, consider being tested at a younger age or more often. Preventing infection Hepatitis B  If you have a higher risk for hepatitis B, you should be screened for this virus. You are considered at high risk for hepatitis B if:  You were born in a country where hepatitis B is common. Ask your health care provider which countries are considered high risk.  Your parents were born in a high-risk country, and you have not been immunized against hepatitis B (hepatitis B vaccine).  You have HIV or AIDS.  You use needles to inject street drugs.  You live with someone who has hepatitis B.  You have had sex with someone who has hepatitis B.  You get hemodialysis treatment.  You take certain medicines for conditions, including cancer, organ transplantation, and autoimmune conditions. Hepatitis C  Blood testing is recommended for:  Everyone born from [birth year] through 1963.  Anyone with known risk factors for hepatitis C. Sexually transmitted infections (STIs)  You should be screened for sexually transmitted  infections (STIs) including gonorrhea and chlamydia if:  You are sexually active and are younger than 49 years of age.  You are older than 49 years of age and your health care provider tells you that you are at risk for this type of infection.  Your sexual activity has changed since you were last screened and you are at an increased risk for chlamydia or gonorrhea. Ask your health care provider if you are at risk.  If you do not have HIV,  but are at risk, it may be recommended that you take a prescription medicine daily to prevent HIV infection. This is called pre-exposure prophylaxis (PrEP). You are considered at risk if:  You are sexually active and do not regularly use condoms or know the HIV status of your partner(s).  You take drugs by injection.  You are sexually active with a partner who has HIV. Talk with your health care provider about whether you are at high risk of being infected with HIV. If you choose to begin PrEP, you should first be tested for HIV. You should then be tested every 3 months for as long as you are taking PrEP. Pregnancy  If you are premenopausal and you may become pregnant, ask your health care provider about preconception counseling.  If you may become pregnant, take 400 to 800 micrograms (mcg) of folic acid every day.  If you want to prevent pregnancy, talk to your health care provider about birth control (contraception). Osteoporosis and menopause  Osteoporosis is a disease in which the bones lose minerals and strength with aging. This can result in serious bone fractures. Your risk for osteoporosis can be identified using a bone density scan.  If you are 59 years of age or older, or if you are at risk for osteoporosis and fractures, ask your health care provider if you should be screened.  Ask your health care provider whether you should take a calcium or vitamin D supplement to lower your risk for osteoporosis.  Menopause may have certain physical  symptoms and risks.  Hormone replacement therapy may reduce some of these symptoms and risks. Talk to your health care provider about whether hormone replacement therapy is right for you. Follow these instructions at home:  Schedule regular health, dental, and eye exams.  Stay current with your immunizations.  Do not use any tobacco products including cigarettes, chewing tobacco, or electronic cigarettes.  If you are pregnant, do not drink alcohol.  If you are breastfeeding, limit how much and how often you drink alcohol.  Limit alcohol intake to no more than 1 drink per day for nonpregnant women. One drink equals 12 ounces of beer, 5 ounces of wine, or 1 ounces of hard liquor.  Do not use street drugs.  Do not share needles.  Ask your health care provider for help if you need support or information about quitting drugs.  Tell your health care provider if you often feel depressed.  Tell your health care provider if you have ever been abused or do not feel safe at home. This information is not intended to replace advice given to you by your health care provider. Make sure you discuss any questions you have with your health care provider. Document Released: 02/09/2011 Document Revised: 01/02/2016 Document Reviewed: 04/30/2015 Elsevier Interactive Patient Education  2017 [employer].

## 2016-12-14 NOTE — Assessment & Plan Note (Addendum)
Pap and clinical breast exam performed today. Due to family history of colon cancer, patient I jointly agreed referral for colonoscopy was appropriate. Pneumococcal vaccine given due to history of asthma. Advised tdap vaccine a local pharmacy. Encouraged exercise.

## 2016-12-14 NOTE — Assessment & Plan Note (Signed)
Discussed peripheral neuropathy of left great toe, pending additional lab studies vitamin deficiency or toxicity of B12 due to her intake of red bull.

## 2016-12-15 LAB — CYTOLOGY - PAP
DIAGNOSIS: NEGATIVE
HPV (WINDOPATH): NOT DETECTED

## 2016-12-17 NOTE — Progress Notes (Signed)
Hi Katie, I took a peek into it. It looks like it was ordered twice. One was documented correctly and the other has not been completed. I tried to delete it but was unable to do so? Do I need to have the provider to go back and delete order?  -Fransisco Beau, [facility]

## 2016-12-18 LAB — VITAMIN B6: VITAMIN B6: 77.9 ng/mL — AB (ref 2.1–21.7)

## 2016-12-21 ENCOUNTER — Encounter: Payer: Self-pay | Admitting: Family

## 2017-01-01 ENCOUNTER — Ambulatory Visit: Payer: Managed Care, Other (non HMO) | Admitting: Podiatry

## 2017-01-08 ENCOUNTER — Ambulatory Visit: Payer: Managed Care, Other (non HMO) | Admitting: Podiatry

## 2017-01-15 ENCOUNTER — Ambulatory Visit (INDEPENDENT_AMBULATORY_CARE_PROVIDER_SITE_OTHER): Payer: Managed Care, Other (non HMO) | Admitting: Podiatry

## 2017-01-15 ENCOUNTER — Encounter: Payer: Self-pay | Admitting: Podiatry

## 2017-01-15 DIAGNOSIS — M722 Plantar fascial fibromatosis: Secondary | ICD-10-CM

## 2017-01-15 MED ORDER — GABAPENTIN 100 MG PO CAPS: 100.0000 mg | ORAL_CAPSULE | Freq: Three times a day (TID) | ORAL | 0 refills | Status: DC

## 2017-01-17 NOTE — Progress Notes (Signed)
   Subjective: Patient presents today for follow up evaluation of plantar evaluation of the left foot. She states her pain is better but is unsure if the fascial braces have helped. She states she is still experiencing some numbness in the left great toe. She states she recently had her B6 levels checked and they were high; she states she read online that this can cause numbness. She states she does not want to take meloxicam due to side effects.  Objective: Physical Exam General: The patient is alert and oriented x3 in no acute distress.  Dermatology: Skin is warm, dry and supple bilateral lower extremities. Negative for open lesions or macerations bilateral.   Vascular: Dorsalis Pedis and Posterior Tibial pulses palpable bilateral.  Capillary fill time is immediate to all digits.  Neurological: Paresthesias of the left great toe.Epicritic and protective threshold intact bilateral.   Musculoskeletal: Tenderness to palpation at the medial calcaneal tubercale and through the insertion of the plantar fascia of the left foot. All other joints range of motion within normal limits bilateral. Strength 5/5 in all groups bilateral.    Assessment: 1. Plantar fasciitis left foot 2. Pain in left foot 3. Peripheral neuropathy bilaterally  Plan of Care:  1. Patient evaluated.   2. Injection of 0.5cc Celestone soluspan injected into the left plantar fascia.  3. Continue taking meloxicam 15 mg. 4. Prescription for gabapentin 100 mg daily at bedtime given to patient. 5. Return to clinic in 4 weeks.     Edrick Kins, DPM [facility]  Dr. Edrick Kins, [place]                                        [place], [place] [address]                Office [phone]  Fax [phone]

## 2017-01-22 ENCOUNTER — Ambulatory Visit: Payer: Self-pay | Admitting: Physician Assistant

## 2017-01-30 MED ORDER — BETAMETHASONE SOD PHOS & ACET 6 (3-3) MG/ML IJ SUSP: 3.0000 mg | Freq: Once | INTRAMUSCULAR | Status: DC

## 2017-02-19 ENCOUNTER — Ambulatory Visit (INDEPENDENT_AMBULATORY_CARE_PROVIDER_SITE_OTHER): Payer: Managed Care, Other (non HMO) | Admitting: Podiatry

## 2017-02-19 DIAGNOSIS — M722 Plantar fascial fibromatosis: Secondary | ICD-10-CM | POA: Diagnosis not present

## 2017-02-24 ENCOUNTER — Encounter: Payer: Self-pay | Admitting: Physician Assistant

## 2017-02-24 ENCOUNTER — Ambulatory Visit: Payer: Self-pay | Admitting: Physician Assistant

## 2017-02-24 VITALS — BP 120/79 | HR 76 | Temp 98.5°F | Resp 16

## 2017-02-24 DIAGNOSIS — L03011 Cellulitis of right finger: Secondary | ICD-10-CM

## 2017-02-24 MED ORDER — CLINDAMYCIN HCL 300 MG PO CAPS: 300.0000 mg | ORAL_CAPSULE | Freq: Three times a day (TID) | ORAL | 0 refills | Status: DC

## 2017-02-24 NOTE — Progress Notes (Signed)
S: c/o r index finger pain, swelling, and redness, states she got a rose thorn stuck in her finger few days ago and the area started getting red, now hurts to bend the finger, no fever/chills  O: vitals wnl, nad, skin is red on side of index finger and bottom, +positive puncture wound at base and side of finger, no drainage, decreased rom with bending the finger, n/v intact  A: cellulitis,   P: clindamycin 317m tid, recheck tomorrow, warm salt water soaks

## 2017-02-25 ENCOUNTER — Ambulatory Visit
Admission: RE | Admit: 2017-02-25 | Discharge: 2017-02-25 | Disposition: A | Discharge status: Home / Self Care | Payer: Managed Care, Other (non HMO) | Source: Ambulatory Visit | Attending: Physician Assistant | Admitting: Physician Assistant

## 2017-02-25 ENCOUNTER — Ambulatory Visit: Payer: Self-pay | Admitting: Physician Assistant

## 2017-02-25 ENCOUNTER — Encounter: Payer: Self-pay | Admitting: Physician Assistant

## 2017-02-25 VITALS — BP 130/70 | HR 70 | Temp 97.6°F | Resp 16

## 2017-02-25 DIAGNOSIS — L03011 Cellulitis of right finger: Secondary | ICD-10-CM

## 2017-02-25 DIAGNOSIS — L0390 Cellulitis, unspecified: Secondary | ICD-10-CM | POA: Diagnosis not present

## 2017-02-25 MED ORDER — SODIUM CHLORIDE 0.9 % IV SOLN
INTRAVENOUS | Status: DC
  Administered 2017-02-25: 14:00:00 via INTRAVENOUS

## 2017-02-25 MED ORDER — CLINDAMYCIN PHOSPHATE 600 MG/50ML IV SOLN
600.0000 mg | Freq: Once | INTRAVENOUS | Status: AC
  Administered 2017-02-25: 600 mg via INTRAVENOUS
  Filled 2017-02-25: qty 50

## 2017-02-25 NOTE — Progress Notes (Signed)
Pt left unit stable and ambulatory

## 2017-02-25 NOTE — Progress Notes (Signed)
S: here for recheck of finger, area is more but swelling has decreased and has better rom of finger, no fever/chills, is taking medication as rx'd  O: vitals wnl, nad, skin with redness, some swelling on r index finger and now has spread to knuckle and top of hand distally, no drainage, better rom today, n/v intact  A: cellulitis  P: concerns of spreading cellulitis, clindamycin 620m iv, recheck tomorrow

## 2017-02-26 ENCOUNTER — Encounter: Payer: Self-pay | Admitting: Physician Assistant

## 2017-02-26 ENCOUNTER — Ambulatory Visit: Payer: Self-pay | Admitting: Physician Assistant

## 2017-02-26 VITALS — BP 106/74 | Temp 98.1°F

## 2017-02-26 DIAGNOSIS — L03011 Cellulitis of right finger: Secondary | ICD-10-CM

## 2017-02-26 NOTE — Progress Notes (Signed)
S: here for recheck of cellulitis, had iv clindamycin yesterday through [facility], states the visit went well, finger is much better today, has better rom and less swelling and redness, no fever/chills  O: vitals wnl, nad, skin with minimal redness, no redness in hand, decreased swelling, almost full rom of finger, n/v intact  A: cellulitis recheck  P: f/u as needed, finish oral antibiotic

## 2017-02-28 MED ORDER — BETAMETHASONE SOD PHOS & ACET 6 (3-3) MG/ML IJ SUSP: 3.0000 mg | Freq: Once | INTRAMUSCULAR | Status: DC

## 2017-02-28 NOTE — Progress Notes (Signed)
   Subjective: Patient presents today for follow-up treatment and evaluation of plantar fasciitis left foot. Patient states that she is doing a little better. Patient has some numbness to the left great toe. She believes the injection and Motrin help. She does not believe the gabapentin is helping.  Objective: Physical Exam General: The patient is alert and oriented x3 in no acute distress.  Dermatology: Skin is warm, dry and supple bilateral lower extremities. Negative for open lesions or macerations bilateral.   Vascular: Dorsalis Pedis and Posterior Tibial pulses palpable bilateral.  Capillary fill time is immediate to all digits.  Neurological: Epicritic and protective threshold intact bilateral.   Musculoskeletal: Tenderness to palpation at the medial calcaneal tubercale and through the insertion of the plantar fascia of the left foot. All other joints range of motion within normal limits bilateral. Strength 5/5 in all groups bilateral.   Assessment: 1. Plantar fasciitis left foot  Plan of Care:  1. Patient evaluated. Xrays reviewed.   2. Injection of 0.5cc Celestone soluspan injected into the left plantar fascia.  3. Discontinue gabapentin 100 mg if there is no beneficial effect. 4. Recommend trying super feet insoles 5. Continue good shoe gear 6. Return to clinic in 4 weeks     Edrick Kins, DPM [facility]  Dr. Edrick Kins, DPM    [address], [place] [address]                Office [phone]  Fax [phone]

## 2017-03-04 ENCOUNTER — Encounter: Payer: Self-pay | Admitting: Radiology

## 2017-03-04 ENCOUNTER — Ambulatory Visit
Admission: RE | Admit: 2017-03-04 | Discharge: 2017-03-04 | Disposition: A | Discharge status: Home / Self Care | Payer: Managed Care, Other (non HMO) | Source: Ambulatory Visit | Attending: Family | Admitting: Family

## 2017-03-04 DIAGNOSIS — Z1231 Encounter for screening mammogram for malignant neoplasm of breast: Secondary | ICD-10-CM | POA: Diagnosis not present

## 2017-03-04 DIAGNOSIS — Z0000 Encounter for general adult medical examination without abnormal findings: Secondary | ICD-10-CM

## 2017-03-09 ENCOUNTER — Encounter: Payer: Self-pay | Admitting: Physician Assistant

## 2017-03-09 ENCOUNTER — Ambulatory Visit: Payer: Self-pay | Admitting: Physician Assistant

## 2017-03-09 VITALS — BP 110/70 | HR 72 | Temp 98.2°F | Resp 16

## 2017-03-09 DIAGNOSIS — M7989 Other specified soft tissue disorders: Secondary | ICD-10-CM

## 2017-03-09 NOTE — Progress Notes (Signed)
S: here for recheck of finger, states she has full movement and the redness has gone away, just feels that the finger is more swollen than it should be, no numbness or tingling  O: vitals wnl, nad, skin intact, no redness, full rom, no bony tenderness, finger appears larger than the one on the left hand, n/v intact  A: finger swelling  P: recheck in 2-3 weeks, if not improved will refer to ortho

## 2017-03-17 ENCOUNTER — Ambulatory Visit: Payer: Self-pay | Admitting: Family

## 2017-03-26 ENCOUNTER — Ambulatory Visit (INDEPENDENT_AMBULATORY_CARE_PROVIDER_SITE_OTHER): Payer: Managed Care, Other (non HMO) | Admitting: Podiatry

## 2017-03-26 DIAGNOSIS — M722 Plantar fascial fibromatosis: Secondary | ICD-10-CM

## 2017-03-26 MED ORDER — MELOXICAM 15 MG PO TABS: 15.0000 mg | ORAL_TABLET | Freq: Every day | ORAL | 0 refills | Status: DC

## 2017-03-27 MED ORDER — BETAMETHASONE SOD PHOS & ACET 6 (3-3) MG/ML IJ SUSP: 3.0000 mg | Freq: Once | INTRAMUSCULAR | Status: DC

## 2017-03-27 NOTE — Progress Notes (Signed)
   Subjective: Patient presents today for follow-up treatment and evaluation of plantar fasciitis left foot. Patient states that the injection helped significantly for approximately 3 weeks. The pain slowly began to come back. Patient states that she's much better since last visit.  Objective: Physical Exam General: The patient is alert and oriented x3 in no acute distress.  Dermatology: Skin is warm, dry and supple bilateral lower extremities. Negative for open lesions or macerations bilateral.   Vascular: Dorsalis Pedis and Posterior Tibial pulses palpable bilateral.  Capillary fill time is immediate to all digits.  Neurological: Epicritic and protective threshold intact bilateral.   Musculoskeletal: Tenderness to palpation at the medial calcaneal tubercale and through the insertion of the plantar fascia of the left foot. All other joints range of motion within normal limits bilateral. Strength 5/5 in all groups bilateral.   Assessment: 1. Plantar fasciitis left foot  Plan of Care:  1. Patient evaluated. Xrays reviewed.   2. Injection of 0.5cc Celestone soluspan injected into the left plantar fascia.  3. Prescription for meloxicam 15 mg 4. Night splint dispensed 5. Continue wearing good shoe gear 6. Aspiration next visit if she was ever able to get the super feet insoles 7. Return to clinic in 4 weeks  Edrick Kins, DPM [facility]  Dr. Edrick Kins, DPM    [address], [place] [address]                Office [phone]  Fax [phone]

## 2017-04-23 ENCOUNTER — Ambulatory Visit: Payer: Managed Care, Other (non HMO) | Admitting: Podiatry

## 2017-05-04 ENCOUNTER — Ambulatory Visit (INDEPENDENT_AMBULATORY_CARE_PROVIDER_SITE_OTHER): Payer: Managed Care, Other (non HMO) | Admitting: Podiatry

## 2017-05-04 ENCOUNTER — Ambulatory Visit (INDEPENDENT_AMBULATORY_CARE_PROVIDER_SITE_OTHER): Payer: Managed Care, Other (non HMO)

## 2017-05-04 DIAGNOSIS — M722 Plantar fascial fibromatosis: Secondary | ICD-10-CM

## 2017-05-04 MED ORDER — MELOXICAM 15 MG PO TABS: 15.0000 mg | ORAL_TABLET | Freq: Every day | ORAL | 3 refills | Status: DC

## 2017-05-05 NOTE — Progress Notes (Signed)
   Subjective: Patient presents today for follow-up treatment and evaluation of plantar fasciitis left foot. She states her pain has improved and she is currently feeling better. She reports a new complaint of paresthesias to the plantar aspect of the left foot that began 2 weeks ago. She states the foot feels swollen. There are no modifying factors noted. She is here for further evaluation and treatment.   Past Medical History:  Diagnosis Date  . Asthma   . Miscarriage 2013   multiple   . Thoracic outlet syndrome     Objective: Physical Exam General: The patient is alert and oriented x3 in no acute distress.  Dermatology: Skin is warm, dry and supple bilateral lower extremities. Negative for open lesions or macerations bilateral.   Vascular: Dorsalis Pedis and Posterior Tibial pulses palpable bilateral.  Capillary fill time is immediate to all digits.  Neurological: Paresthesia with light touch to the lateral foot along the sural nerve. Epicritic and protective threshold intact bilateral.   Musculoskeletal: Tenderness to palpation at the medial calcaneal tubercale and through the insertion of the plantar fascia of the left foot. All other joints range of motion within normal limits bilateral. Strength 5/5 in all groups bilateral.   Assessment: 1. Plantar fasciitis left foot 2. Paresthesia left foot  Plan of Care:  1. Patient evaluated. Xrays reviewed.   2. Recommended change in shoe gear. 3. Refill prescription for meloxicam 15 mg given to patient. 4. Compression anklet dispensed. 5. Return to clinic when necessary.  Edrick Kins, DPM [facility]  Dr. Edrick Kins, DPM    [address], [place] [address]                Office [phone]  Fax [phone]

## 2017-05-17 ENCOUNTER — Ambulatory Visit: Payer: Self-pay | Admitting: Physician Assistant

## 2017-05-17 ENCOUNTER — Encounter: Payer: Self-pay | Admitting: Physician Assistant

## 2017-05-17 VITALS — BP 110/60 | HR 69 | Temp 98.5°F | Resp 16

## 2017-05-17 DIAGNOSIS — G629 Polyneuropathy, unspecified: Secondary | ICD-10-CM

## 2017-05-17 DIAGNOSIS — M79672 Pain in left foot: Secondary | ICD-10-CM

## 2017-05-17 MED ORDER — DICLOFENAC SODIUM 75 MG PO TBEC
75.0000 mg | DELAYED_RELEASE_TABLET | Freq: Two times a day (BID) | ORAL | 6 refills | Status: DC
Start: 2017-05-17 — End: 2018-01-14

## 2017-05-17 MED ORDER — OXYBUTYNIN CHLORIDE ER 10 MG PO TB24: 10.0000 mg | ORAL_TABLET | Freq: Every day | ORAL | 6 refills | Status: DC

## 2017-05-17 NOTE — Progress Notes (Signed)
S: c/o continued left foot pain, states the plantar fasciitis got better but noticed that her foot has been swollen, from the beginning of the foot pain her great toe was numb, now all of her toes are numb, talked to the podiatrist about it but he didn't have an answer, has hx of thoracic outlet syndrome and is worried this may be causing problems, would like to go to another foot doctor, also having urinary freq and some incontinence problems, ?if we could rx ditropan as she had used this in the past  O: vitals wnl, nad, left foot appears swollen in the arch of the foot or ?if the arch has fallen, left foot appears larger than the right, can feel sharp and soft touch on the toes, is able to wiggle toes, n/v intact  A: foot pain, neuropathy, urinary incontinence  P: ditropan xl 15m, refer to podiatry at [facility], stop mobic use diclofenac 762mbid

## 2017-05-21 NOTE — Progress Notes (Signed)
Patient has been referred to see Dr. Caryl Comes at [facility] on 05/25/17 at 11:30.  Patient has been notified of her appointment and has accepted the date and time.

## 2017-06-03 ENCOUNTER — Other Ambulatory Visit: Payer: Self-pay | Admitting: Physician Assistant

## 2017-06-03 DIAGNOSIS — J302 Other seasonal allergic rhinitis: Secondary | ICD-10-CM

## 2017-08-10 DIAGNOSIS — G629 Polyneuropathy, unspecified: Secondary | ICD-10-CM

## 2017-08-10 HISTORY — DX: Polyneuropathy, unspecified: G62.9

## 2018-01-14 ENCOUNTER — Ambulatory Visit: Payer: Self-pay | Admitting: Family Medicine

## 2018-01-14 VITALS — BP 128/58 | HR 72 | Resp 16 | Ht 63.0 in | Wt 225.0 lb

## 2018-01-14 DIAGNOSIS — Z0189 Encounter for other specified special examinations: Principal | ICD-10-CM

## 2018-01-14 DIAGNOSIS — Z008 Encounter for other general examination: Secondary | ICD-10-CM

## 2018-01-14 NOTE — Progress Notes (Signed)
Subjective: Annual biometrics screening  Patient presents for her annual biometric screening. Patient reports eating a healthy, well-rounded diet.  Patient has not been active recently due to ongoing left ankle pain, which she has been seeing a podiatrist for.  Patient recently began walking again and plans to increase the amount of physical activity she is getting in the near future.  Patient reports that she does not regularly see a primary care provider and plans to establish care with a new one in the near future. PCP: None currently. Patient works for [employer] Patient denies any other issues or concerns.   Review of Systems Unremarkable  Objective  Physical Exam General: Awake, alert and oriented. No acute distress. Well developed, hydrated and nourished. Appears stated age.  HEENT: Supple neck without adenopathy. Sclera is non-icteric. The ear canal is clear without discharge. The tympanic membrane is normal in appearance with normal landmarks and cone of light. Nasal mucosa is pink and moist. Oral mucosa is pink and moist. The pharynx is normal in appearance without tonsillar swelling or exudates.  Skin: Skin in warm, dry and intact without rashes or lesions. Appropriate color for ethnicity. Cardiac: Heart rate and rhythm are normal. No murmurs, gallops, or rubs are auscultated.  Respiratory: The chest wall is symmetric and without deformity. No signs of respiratory distress. Lung sounds are clear in all lobes bilaterally without rales, ronchi, or wheezes.  Neurological: The patient is awake, alert and oriented to person, place, and time with normal speech.  Memory is normal and thought processes intact. No gait abnormalities are appreciated.  Psychiatric: Appropriate mood and affect.   Assessment Annual biometrics screening  Plan  Lipid panel and fasting blood sugar pending. Encouraged routine visits with primary care provider.  Provided patient with a list of local resources and  encouraged her to establish care with a primary care provider within the next month. Encouraged patient to get regular exercise and eat a healthy, well-rounded diet.

## 2018-01-15 LAB — GLUCOSE, RANDOM: Glucose: 77 mg/dL (ref 65–99)

## 2018-01-15 LAB — LIPID PANEL
CHOL/HDL RATIO: 4.1 ratio (ref 0.0–4.4)
Cholesterol, Total: 188 mg/dL (ref 100–199)
HDL: 46 mg/dL (ref >39)
LDL CALC: 127 mg/dL — AB (ref 0–99)
TRIGLYCERIDES: 73 mg/dL (ref 0–149)
VLDL CHOLESTEROL CAL: 15 mg/dL (ref 5–40)

## 2018-01-17 NOTE — Progress Notes (Signed)
Dear Denise Gamble, I wanted to let you know that your lipid panel and fasting blood sugar came back.  Everything is normal, with the exception of your LDL cholesterol. Your LDL cholesterol ("bad cholesterol") is elevated at 127, normal values are below 99.  Last year your LDL cholesterol was 123.  I want you to follow-up with your primary care provider regarding these results.

## 2018-08-25 ENCOUNTER — Other Ambulatory Visit: Payer: Self-pay

## 2018-08-25 ENCOUNTER — Emergency Department: Payer: Managed Care, Other (non HMO)

## 2018-08-25 ENCOUNTER — Ambulatory Visit: Payer: Self-pay | Admitting: Emergency Medicine

## 2018-08-25 ENCOUNTER — Emergency Department
Admission: EM | Admit: 2018-08-25 | Discharge: 2018-08-25 | Disposition: A | Discharge status: Home / Self Care | Payer: Managed Care, Other (non HMO) | Attending: Emergency Medicine | Admitting: Emergency Medicine

## 2018-08-25 ENCOUNTER — Encounter: Payer: Self-pay | Admitting: Emergency Medicine

## 2018-08-25 VITALS — BP 120/78 | HR 78 | Temp 97.9°F | Resp 14

## 2018-08-25 DIAGNOSIS — J45909 Unspecified asthma, uncomplicated: Secondary | ICD-10-CM | POA: Diagnosis not present

## 2018-08-25 DIAGNOSIS — Z79899 Other long term (current) drug therapy: Secondary | ICD-10-CM | POA: Insufficient documentation

## 2018-08-25 DIAGNOSIS — R079 Chest pain, unspecified: Secondary | ICD-10-CM

## 2018-08-25 LAB — BASIC METABOLIC PANEL
Anion gap: 5 (ref 5–15)
BUN: 15 mg/dL (ref 6–20)
CO2: 30 mmol/L (ref 22–32)
Calcium: 9.2 mg/dL (ref 8.9–10.3)
Chloride: 105 mmol/L (ref 98–111)
Creatinine, Ser: 0.74 mg/dL (ref 0.44–1.00)
GFR calc Af Amer: >60 mL/min (ref >60)
GFR calc non Af Amer: >60 mL/min (ref >60)
Glucose, Bld: 94 mg/dL (ref 70–99)
POTASSIUM: 3.7 mmol/L (ref 3.5–5.1)
SODIUM: 140 mmol/L (ref 135–145)

## 2018-08-25 LAB — CBC
HEMATOCRIT: 42.7 % (ref 36.0–46.0)
Hemoglobin: 13.8 g/dL (ref 12.0–15.0)
MCH: 31.3 pg (ref 26.0–34.0)
MCHC: 32.3 g/dL (ref 30.0–36.0)
MCV: 96.8 fL (ref 80.0–100.0)
NRBC: 0 % (ref 0.0–0.2)
Platelets: 269 10*3/uL (ref 150–400)
RBC: 4.41 MIL/uL (ref 3.87–5.11)
RDW: 13.1 % (ref 11.5–15.5)
WBC: 7.8 10*3/uL (ref 4.0–10.5)

## 2018-08-25 LAB — TROPONIN I: Troponin I: <0.03 ng/mL (ref <0.03)

## 2018-08-25 NOTE — ED Provider Notes (Signed)
[facility] Emergency Department Provider Note  Time seen: 5:43 PM  I have reviewed the triage vital signs and the nursing notes.   HISTORY  Chief Complaint Chest Pain    HPI Denise Gamble is a 53 y.o. female with a past medical history of asthma, presents to the emergency department for chest pain.  According to the patient yesterday morning she awoke with a aching pain in her chest.  Patient states she took Tums, pain lasted approximately 20 to 30 minutes and then resolved.  Patient states she has not had any symptoms since.  Denies any shortness of breath nausea or diaphoresis at any point.  Patient called her doctor but due to a family history of cardiac disease they referred her to the emergency department for further evaluation.  Patient states her mother was diagnosed with Epstein anomaly, brother had an aortic rupture, grandmother had an MI at 76 years old.  Currently patient is symptom-free.  Overall appears very well with no symptoms at this time.   Past Medical History:  Diagnosis Date  . Asthma   . Miscarriage 2013   multiple   . Thoracic outlet syndrome     Patient Active Problem List   Diagnosis Date Noted  . Routine physical examination 12/14/2016  . Peripheral neuropathy 12/14/2016  . Overweight 09/15/2016  . Asthma 09/14/2016    Past Surgical History:  Procedure Laterality Date  . LYMPHADENECTOMY      Prior to Admission medications   Medication Sig Start Date End Date Taking? Authorizing Provider  albuterol (PROAIR HFA) 108 (90 Base) MCG/ACT inhaler Inhale 2 puffs into the lungs every 4 (four) hours as needed for wheezing or shortness of breath. 11/26/15   Fisher, Linden Dolin, PA-C  cetirizine (ZYRTEC) 10 MG tablet Take 10 mg by mouth.    [provider]  ibuprofen (ADVIL,MOTRIN) 200 MG tablet Take 200 mg by mouth every 6 (six) hours as needed.    [provider]  montelukast (SINGULAIR) 10 MG tablet TAKE ONE TABLET  BY MOUTH AT BEDTIME 06/04/17   Fisher, Linden Dolin, PA-C  oxybutynin (DITROPAN-XL) 10 MG 24 hr tablet Take 1 tablet (10 mg total) by mouth at bedtime. 05/17/17   Versie Starks, PA-C    Allergies  Allergen Reactions  . Pollen Extract Hives  . Latex     Family History  Problem Relation Age of Onset  . Heart disease Mother 70       epstein anolmay   . Colon cancer Father 97  . Skin cancer Father        ? thinks melanoma?   . Leukemia Father        AAL  . Heart disease Brother   . Coronary artery disease Sister   . Breast cancer Neg Hx     Social History Social History   Tobacco Use  . Smoking status: Never Smoker  . Smokeless tobacco: Never Used  Substance Use Topics  . Alcohol use: No    Alcohol/week: 0.0 standard drinks  . Drug use: No    Review of Systems Constitutional: Negative for fever. Cardiovascular: Chest pain yesterday morning, none since Respiratory: Negative for shortness of breath. Gastrointestinal: Negative for abdominal pain, vomiting  Genitourinary: Negative for urinary compaints Musculoskeletal: Negative for leg pain or swelling Skin: Negative for skin complaints  Neurological: Negative for headache All other ROS negative  ____________________________________________   PHYSICAL EXAM:  VITAL SIGNS: ED Triage Vitals  Enc Vitals Group  BP 08/25/18 1322 140/86     Pulse Rate 08/25/18 1322 72     Resp 08/25/18 1322 20     Temp 08/25/18 1322 97.8 F (36.6 C)     Temp Source 08/25/18 1322 Oral     SpO2 08/25/18 1322 99 %     Weight 08/25/18 1320 214 lb (97.1 kg)     Height 08/25/18 1320 5' 3"  (1.6 m)     Head Circumference --      Peak Flow --      Pain Score 08/25/18 1320 0     Pain Loc --      Pain Edu? --      Excl. in Foley? --    Constitutional: Alert and oriented. Well appearing and in no distress. Eyes: Normal exam ENT   Head: Normocephalic and atraumatic.   Mouth/Throat: Mucous membranes are moist. Cardiovascular: Normal  rate, regular rhythm. No murmur Respiratory: Normal respiratory effort without tachypnea nor retractions. Breath sounds are clear  Gastrointestinal: Soft and nontender. No distention.  Musculoskeletal: Nontender with normal range of motion in all extremities. No lower extremity tenderness or edema. Neurologic:  Normal speech and language. No gross focal neurologic deficits  Skin:  Skin is warm, dry and intact.  Psychiatric: Mood and affect are normal.  ____________________________________________    EKG  EKG viewed and interpreted by myself shows a normal sinus rhythm at 64 bpm with a narrow QRS, normal axis, normal intervals, no concerning ST changes.  ____________________________________________    RADIOLOGY  Chest x-ray is negative  ____________________________________________   INITIAL IMPRESSION / ASSESSMENT AND PLAN / ED COURSE  Pertinent labs & imaging results that were available during my care of the patient were reviewed by me and considered in my medical decision making (see chart for details).  Patient presents to the emergency department for chest pain yesterday morning, resolved after taking Tums, no pain since.  No shortness of breath nausea or diaphoresis at any point.  Overall the patient appears very well, no distress.  Normal physical exam.  Patient's work-up is very reassuring including normal labs, negative troponin, reassuring chest x-ray and EKG.  However given the patient's family history I did discuss with the patient follow-up with cardiology for a stress test.  Patient agreeable to plan of care.  We will discharge with PCP and cardiology follow-up.  ____________________________________________   FINAL CLINICAL IMPRESSION(S) / ED DIAGNOSES  Chest pain   Harvest Dark, MD 08/25/18 1745

## 2018-08-25 NOTE — ED Notes (Signed)
Patient verbalized understanding of discharge instructions, no questions. Patient ambulated out of ED with steady gait in no distress.  

## 2018-08-25 NOTE — Progress Notes (Signed)
Subjective: Patient presents with an episode yesterday morning which lasted approximately 20 minutes of severe 10 out of 10 substernal chest pain.  The pain was in the center of the chest she is unable to qualify the pain.  There was no radiation to the neck shoulders or back.  This was not associated with any nausea or vomiting.  She states she may have felt sweaty but did not have any associated shortness of breath.  She denies recent travel.  She has had no calf swelling.  Pertinent history reveals negative smoking history no history of diabetes or high cholesterol.  She has a family history of an aortic dissection in a brother and Ebstein's anomaly in her mother. Patient took Tums and aspirin for the episode. Objective: Today's Vitals   08/25/18 1248  BP: 120/78  Pulse: 78  Resp: 14  Temp: 97.9 F (36.6 C)  TempSrc: Oral  SpO2: 98%   There is no height or weight on file to calculate BMI.  Alert cooperative not in any distress. Neck supple without adenopathy or thyromegaly Chest clear to auscultation Heart regular rate and rhythm. Abdomen soft nontender Musculoskeletal specifically no chest wall tenderness. Calves no tenderness. Pulses dorsalis pedis posterior tibial 2+  EKG: Sinus arrhythmia with sinus bradycardia no acute ST-T changes noted Assessment: Episode yesterday of 20 minutes of substernal discomfort.  She took Tums and aspirin with relief.  She does have a family history of cardiac issues.  I do think it would be appropriate for her to have a troponin and d-dimer for evaluation.  Hopefully these will be negative and she can have follow-up specialist evaluation. Plan: Patient accompanied to the ER for their evaluation.  No medications administered in our office.

## 2018-08-25 NOTE — ED Notes (Signed)
Pt sent from walk in clinic with MD with ECG in hand, pt c/o having chest pain yesterday, asymptomatic today but wanted pt to have a cardiac workup.

## 2018-08-25 NOTE — Discharge Instructions (Signed)
Please call the number provided for cardiology to arrange a follow-up appointment.  Return to the emergency department for any significant return of chest pain, trouble breathing, or any other symptom personally concerning to yourself.

## 2018-08-25 NOTE — ED Triage Notes (Signed)
Pt states that she had an episode yesterday am to her mid chest that was really painful. Pt states it lasted 20-30 minutes and she took aspirin and tums. Pt reports went to MD today to follow up about it due to a significant cardiac history in her family.

## 2018-08-29 ENCOUNTER — Other Ambulatory Visit: Payer: Self-pay | Admitting: Internal Medicine

## 2018-08-29 DIAGNOSIS — Z1231 Encounter for screening mammogram for malignant neoplasm of breast: Secondary | ICD-10-CM

## 2018-09-15 ENCOUNTER — Ambulatory Visit
Admission: RE | Admit: 2018-09-15 | Discharge: 2018-09-15 | Disposition: A | Discharge status: Home / Self Care | Payer: Managed Care, Other (non HMO) | Source: Ambulatory Visit | Attending: Internal Medicine | Admitting: Internal Medicine

## 2018-09-15 DIAGNOSIS — Z1231 Encounter for screening mammogram for malignant neoplasm of breast: Secondary | ICD-10-CM | POA: Diagnosis not present

## 2018-09-23 ENCOUNTER — Ambulatory Visit: Payer: Self-pay | Admitting: Adult Health

## 2018-09-23 ENCOUNTER — Ambulatory Visit
Admission: RE | Admit: 2018-09-23 | Discharge: 2018-09-23 | Disposition: A | Discharge status: Home / Self Care | Payer: Managed Care, Other (non HMO) | Attending: Adult Health | Admitting: Adult Health

## 2018-09-23 ENCOUNTER — Ambulatory Visit
Admission: RE | Admit: 2018-09-23 | Discharge: 2018-09-23 | Disposition: A | Discharge status: Home / Self Care | Payer: Managed Care, Other (non HMO) | Source: Ambulatory Visit | Attending: Adult Health | Admitting: Adult Health

## 2018-09-23 ENCOUNTER — Other Ambulatory Visit: Payer: Self-pay

## 2018-09-23 VITALS — BP 118/86 | HR 80 | Temp 98.2°F | Resp 14

## 2018-09-23 DIAGNOSIS — R059 Cough, unspecified: Secondary | ICD-10-CM

## 2018-09-23 DIAGNOSIS — J069 Acute upper respiratory infection, unspecified: Secondary | ICD-10-CM

## 2018-09-23 DIAGNOSIS — J181 Lobar pneumonia, unspecified organism: Principal | ICD-10-CM

## 2018-09-23 DIAGNOSIS — R05 Cough: Secondary | ICD-10-CM | POA: Insufficient documentation

## 2018-09-23 DIAGNOSIS — J189 Pneumonia, unspecified organism: Secondary | ICD-10-CM

## 2018-09-23 LAB — POCT RAPID STREP A (OFFICE): RAPID STREP A SCREEN: NEGATIVE

## 2018-09-23 MED ORDER — AMOXICILLIN-POT CLAVULANATE 875-125 MG PO TABS: 1.0000 | ORAL_TABLET | Freq: Two times a day (BID) | ORAL | 0 refills | Status: DC

## 2018-09-23 MED ORDER — PREDNISONE 10 MG (21) PO TBPK: ORAL_TABLET | ORAL | 0 refills | Status: DC

## 2018-09-23 NOTE — Addendum Note (Signed)
Addended by: Doreen Beam on: 09/23/2018 04:13 PM   Modules accepted: Orders

## 2018-09-23 NOTE — Progress Notes (Addendum)
Subjective:     Patient ID: Denise Gamble, female   DOB: July 17, 1966, 53 y.o.   MRN: [id number]  HPI  Blood pressure 118/86, pulse 80, temperature 98.2 F (36.8 C), temperature source Oral, resp. rate 14, last menstrual period 03/28/2018, SpO2 98 %.  Patient is a 53 year old female in no acute distress who comes to the clinic with complaints of being sick since  09/18/2017 she had fever 101.5, nasal congestion cough, chest congestion and called [facility] instead of going to urgent care she reports.  She has used  has used Advair inhaler once yesterday for mild wheezing.  She reports she started with mild fatigue to moderate fatigue yesterday. Reports she has a good appetite.  She denies any nausea or vomiting. Last night 99.5 temperature.  He denies any rash  Drinking well she reports.  Denies any chest pain.   She reports she was diagnosed by [facility] with Flu - was taking Tamiflu - finished last dose today.   She denies any recent antibiotic use.   She sees Dr. Saunders Revel for stress test in March due to family history.  He was seen in the emergency room on 08/25/2018 for chest pain.  Reports she has had no episodes like the one she had on 08/25/2018 and that this is in no way similar.  She denies any diaphoresis.  Denies any edema.  Denies any chest pain or pressure.  Patient  denies any fever, body aches,chills, rash, chest pain, shortness of breath, nausea, vomiting, or diarrhea.    Allergies  Allergen Reactions  . Pollen Extract Hives  . Latex     Patient Active Problem List   Diagnosis Date Noted  . Routine physical examination 12/14/2016  . Peripheral neuropathy 12/14/2016  . Overweight 09/15/2016  . Asthma 09/14/2016     Current Outpatient Medications:  .  ADVAIR HFA 45-21 MCG/ACT inhaler, Inhale 2 puffs into the lungs 2 (two) times daily., Disp: , Rfl:  .  albuterol (PROAIR HFA) 108 (90 Base) MCG/ACT inhaler, Inhale 2 puffs into the lungs every 4 (four) hours as  needed for wheezing or shortness of breath., Disp: 1 Inhaler, Rfl: 12 .  cetirizine (ZYRTEC) 10 MG tablet, Take 10 mg by mouth., Disp: , Rfl:  .  ibuprofen (ADVIL,MOTRIN) 200 MG tablet, Take 200 mg by mouth every 6 (six) hours as needed., Disp: , Rfl:  .  meloxicam (MOBIC) 15 MG tablet, TAKE 1 TABLET BY MOUTH ONCE DAILY FOR 30 DAYS, Disp: , Rfl:  .  montelukast (SINGULAIR) 10 MG tablet, TAKE ONE TABLET BY MOUTH AT BEDTIME, Disp: 90 tablet, Rfl: 3 .  oseltamivir (TAMIFLU) 75 MG capsule, TAKE 1 CAPSULE BY MOUTH TWICE A DAY FOR 5 DAYS, Disp: , Rfl:  .  oxybutynin (DITROPAN-XL) 10 MG 24 hr tablet, Take 1 tablet (10 mg total) by mouth at bedtime., Disp: 30 tablet, Rfl: 6  Current Facility-Administered Medications:  .  betamethasone acetate-betamethasone sodium phosphate (CELESTONE) injection 3 mg, 3 mg, Intramuscular, Once, Evans, Brent M, DPM .  betamethasone acetate-betamethasone sodium phosphate (CELESTONE) injection 3 mg, 3 mg, Intramuscular, Once, Evans, Brent M, DPM .  betamethasone acetate-betamethasone sodium phosphate (CELESTONE) injection 3 mg, 3 mg, Intramuscular, Once, Evans, Brent M, DPM .  betamethasone acetate-betamethasone sodium phosphate (CELESTONE) injection 3 mg, 3 mg, Intramuscular, Once, Edrick Kins, DPM  Review of Systems  Constitutional: Positive for fatigue and fever. Negative for activity change, appetite change, chills, diaphoresis and unexpected weight change.  HENT: Positive for congestion, ear pain, postnasal drip ( ), rhinorrhea and sore throat. Negative for dental problem, drooling, ear discharge, facial swelling, hearing loss, mouth sores, nosebleeds, sinus pressure, sinus pain, sneezing, tinnitus, trouble swallowing and voice change.   Eyes: Negative.   Respiratory: Positive for chest tightness (with cough intermittent ). Negative for apnea, cough, choking, shortness of breath, wheezing (reports mild wheezing last night and today intermittent) and stridor.    Cardiovascular: Negative for chest pain, palpitations and leg swelling.  Gastrointestinal: Negative.   Endocrine: Negative.   Genitourinary: Negative.   Musculoskeletal: Negative.   Skin: Negative.   Allergic/Immunologic: Positive for environmental allergies. Negative for food allergies and immunocompromised state.  Neurological: Negative.   Hematological: Negative.   Psychiatric/Behavioral: Negative.        Objective:   Physical Exam Vitals signs reviewed.  Constitutional:      General: She is awake. She is not in acute distress.    Appearance: Normal appearance. She is well-groomed and overweight. She is ill-appearing (appears fatigued ). She is not toxic-appearing or diaphoretic.     Comments: Patient is alert and oriented and responsive to questions Engages in eye contact with provider. Speaks in full sentences without any pauses without any shortness of breath or distress.    HENT:     Head: Normocephalic and atraumatic.     Jaw: There is normal jaw occlusion.     Salivary Glands: Right salivary gland is not diffusely enlarged or tender. Left salivary gland is not diffusely enlarged or tender.     Right Ear: Hearing, ear canal and external ear normal. No tenderness. A middle ear effusion is present. There is no impacted cerumen. Tympanic membrane is erythematous. Tympanic membrane is not retracted or bulging.     Left Ear: Hearing, ear canal and external ear normal. No tenderness. A middle ear effusion is present. There is no impacted cerumen. Tympanic membrane is erythematous. Tympanic membrane is not retracted or bulging.     Nose: Mucosal edema, congestion and rhinorrhea present.     Right Sinus: No maxillary sinus tenderness or frontal sinus tenderness.     Left Sinus: No maxillary sinus tenderness or frontal sinus tenderness.     Mouth/Throat:     Lips: Pink. No lesions.     Mouth: Mucous membranes are moist. No angioedema.     Pharynx: Uvula midline. Oropharyngeal  exudate (mild white bilateral tonsil area ) and posterior oropharyngeal erythema present. No uvula swelling.     Tonsils: No tonsillar exudate.  Eyes:     General: Lids are normal. No scleral icterus.       Right eye: No discharge.        Left eye: No discharge.     Extraocular Movements: Extraocular movements intact.     Conjunctiva/sclera: Conjunctivae normal.     Pupils: Pupils are equal, round, and reactive to light.  Neck:     Musculoskeletal: Full passive range of motion without pain, normal range of motion and neck supple.     Trachea: Trachea and phonation normal.  Cardiovascular:     Rate and Rhythm: Normal rate and regular rhythm.     Chest Wall: PMI is not displaced. No thrill.     Pulses: Normal pulses.          Radial pulses are 2+ on the right side and 2+ on the left side.       Dorsalis pedis pulses are 2+ on the right side and 2+ on  the left side.     Heart sounds: Normal heart sounds. Heart sounds not distant. No murmur. No friction rub. No gallop.      Comments: Radial and dorsalis pedis 2+ bilateral  Pulmonary:     Effort: Pulmonary effort is normal. No tachypnea, bradypnea, accessory muscle usage, prolonged expiration, respiratory distress or retractions.     Breath sounds: No stridor, decreased air movement or transmitted upper airway sounds. Examination of the right-upper field reveals wheezing and rhonchi. Examination of the left-upper field reveals wheezing. Examination of the right-middle field reveals rhonchi. Wheezing and rhonchi present. No decreased breath sounds or rales.     Comments: Mild scattered rhonchi as documented.  Mild expiratory wheeze occasionally Chest:     Chest wall: No tenderness.  Abdominal:     Palpations: Abdomen is soft.  Musculoskeletal: Normal range of motion.     Right lower leg: No edema.     Left lower leg: No edema.     Comments: Patient moves on and off of exam table and in room without difficulty. Gait is normal in hall and in  room. Patient is oriented to person place time and situation. Patient answers questions appropriately and engages in conversation.   Lymphadenopathy:     Head:     Right side of head: No submental, submandibular, tonsillar, preauricular, posterior auricular or occipital adenopathy.     Left side of head: No submental, submandibular, tonsillar, preauricular, posterior auricular or occipital adenopathy.     Cervical: Cervical adenopathy present.     Right cervical: Superficial cervical adenopathy (mild bilateral) present.     Left cervical: Superficial cervical adenopathy present.  Skin:    General: Skin is warm and dry.     Capillary Refill: Capillary refill takes less than 2 seconds.     Coloration: Skin is not pale.     Findings: No bruising, erythema, lesion or rash.     Nails: There is no clubbing.   Neurological:     Mental Status: She is alert and oriented to person, place, and time.     GCS: GCS eye subscore is 4. GCS verbal subscore is 5. GCS motor subscore is 6.     Gait: Gait is intact.     Comments: Patient moves on and off of exam table and in room without difficulty. Gait is normal in hall and in room. Patient is oriented to person place time and situation. Patient answers questions appropriately and engages in conversation.   Psychiatric:        Mood and Affect: Mood normal.        Behavior: Behavior normal. Behavior is cooperative.        Thought Content: Thought content normal.        Judgment: Judgment normal.        Assessment:      Pneumonia of right middle lobe due to infectious organism ([place]) - Plan: DG Chest 2 View  Acute upper respiratory infection - Plan: CBC with Diff, POCT rapid strep A, Comprehensive metabolic panel, DG Chest 2 View, CANCELED: DG Chest 2 View  Cough - Plan: CBC with Diff, POCT rapid strep A, DG Chest 2 View, CANCELED: DG Chest 2 View       Plan:         Meds ordered this encounter  Medications  . amoxicillin-clavulanate  (AUGMENTIN) 875-125 MG tablet    Sig: Take 1 tablet by mouth 2 (two) times daily.    Dispense:  20 tablet  Refill:  0  . predniSONE (STERAPRED UNI-PAK 21 TAB) 10 MG (21) TBPK tablet    Sig: PO: Take 6 tablets on day 1:Take 5 tablets day 2:Take 4 tablets day 3: Take 3 tablets day 4:Take 2 tablets day five: 5 Take 1 tablet day 6    Dispense:  21 tablet    Refill:  0   She denies need for inhaler refill.  She will go for chest x-ray, patient will be called if abnormal result. Is aware that no stat labs can be done in this office.  Advised rest and hydration.  Note for work given from 2/14 until return date of 218.  She is to follow-up in the office next week by Tuesday 2/18 or see her primary care.   Gave and reviewed After Visit Summary(AVS) with patient. Patient is advised to read the after visit summary as well and let the provider know if any question, concerns or clarifications are needed.   Provider discussed red flags with patient and she is verbalized understanding that she needs to proceed to the emergency room if any symptoms worsen from what she has at this visit, if she has increasing fever over 101.5, chills, nausea vomiting, any chest pain, shortness of breath or any other emergent symptoms as reviewed she will call 911 or proceed to the nearest emergency room and not self drive.  Advised patient call the office or your primary care doctor for an appointment if no improvement within 72 hours or if any symptoms change or worsen at any time  Advised ER or urgent Care if after hours or on weekend. Call 911 for emergency symptoms at any time.Patinet verbalized understanding of all instructions given/reviewed and treatment plan and has no further questions or concerns at this time.     Patient verbalized understanding  Provider thoroughly discussed in collaboration above plan with supervising physician Dr. Miguel Aschoff who is in agreement with the care plan as above.

## 2018-09-23 NOTE — Patient Instructions (Addendum)
Prednisolone tablets What is this medicine? PREDNISOLONE (pred NISS oh lone) is a corticosteroid. It is commonly used to treat inflammation of the skin, joints, lungs, and other organs. Common conditions treated include asthma, allergies, and arthritis. It is also used for other conditions, such as blood disorders and diseases of the adrenal glands. This medicine may be used for other purposes; ask your health care provider or pharmacist if you have questions. COMMON BRAND NAME(S): Millipred, Millipred DP, Millipred DP 12-Day, Millipred DP 6 Day, Prednoral What should I tell my health care provider before I take this medicine? They need to know if you have any of these conditions: -Cushing's syndrome -diabetes -glaucoma -heart problems or disease -high blood pressure -infection such as herpes, measles, tuberculosis, or chickenpox -kidney disease -liver disease -mental problems -myasthenia gravis -osteoporosis -seizures -stomach ulcer or intestine disease including colitis and diverticulitis -thyroid problem -an unusual or allergic reaction to lactose, prednisolone, other medicines, foods, dyes, or preservatives -pregnant or trying to get pregnant -breast-feeding How should I use this medicine? Take this medicine by mouth with a glass of water. Follow the directions on the prescription label. Take it with food or milk to avoid stomach upset. If you are taking this medicine once a day, take it in the morning. Do not take more medicine than you are told to take. Do not suddenly stop taking your medicine because you may develop a severe reaction. Your doctor will tell you how much medicine to take. If your doctor wants you to stop the medicine, the dose may be slowly lowered over time to avoid any side effects. Talk to your pediatrician regarding the use of this medicine in children. Special care may be needed. Overdosage: If you think you have taken too much of this medicine contact a poison  control center or emergency room at once. NOTE: This medicine is only for you. Do not share this medicine with others. What if I miss a dose? If you miss a dose, take it as soon as you can. If it is almost time for your next dose, take only that dose. Do not take double or extra doses. What may interact with this medicine? Do not take this medicine with any of the following medications: -metyrapone -mifepristone This medicine may also interact with the following medications: -aminoglutethimide -amphotericin B -aspirin and aspirin-like medicines -barbiturates -certain medicines for diabetes, like glipizide or glyburide -cholestyramine -cholinesterase inhibitors -cyclosporine -digoxin -diuretics -ephedrine -female hormones, like estrogens and birth control pills -isoniazid -ketoconazole -NSAIDS, medicines for pain and inflammation, like ibuprofen or naproxen -phenytoin -rifampin -toxoids -vaccines -warfarin This list may not describe all possible interactions. Give your health care provider a list of all the medicines, herbs, non-prescription drugs, or dietary supplements you use. Also tell them if you smoke, drink alcohol, or use illegal drugs. Some items may interact with your medicine. What should I watch for while using this medicine? Visit your doctor or health care professional for regular checks on your progress. If you are taking this medicine over a prolonged period, carry an identification card with your name and address, the type and dose of your medicine, and your doctor's name and address. This medicine may increase your risk of getting an infection. Tell your doctor or health care professional if you are around anyone with measles or chickenpox, or if you develop sores or blisters that do not heal properly. If you are going to have surgery, tell your doctor or health care professional that you have taken  this medicine within the last twelve months. Ask your doctor or  health care professional about your diet. You may need to lower the amount of salt you eat. This medicine may affect blood sugar levels. If you have diabetes, check with your doctor or health care professional before you change your diet or the dose of your diabetic medicine. What side effects may I notice from receiving this medicine? Side effects that you should report to your doctor or health care professional as soon as possible: -allergic reactions like skin rash, itching or hives, swelling of the face, lips, or tongue -changes in emotions or moods -changes in vision -eye pain -signs and symptoms of high blood sugar such as dizziness; dry mouth; dry skin; fruity breath; nausea; stomach pain; increased hunger or thirst; increased urination -signs and symptoms of infection like fever or chills; cough; sore throat; pain or trouble passing urine -slow growth in children (if used for longer periods of time) -swelling of ankles, feet -trouble sleeping -unusually weak or tired -weak bones (if used for longer periods of time) Side effects that usually do not require medical attention (report to your doctor or health care professional if they continue or are bothersome): -increased hunger -nausea -skin problems, acne, thin and shiny skin -upset stomach -weight gain This list may not describe all possible side effects. Call your doctor for medical advice about side effects. You may report side effects to FDA at 1-800-FDA-1088. Where should I keep my medicine? Keep out of the reach of children. Store at room temperature between 15 and 30 degrees C (59 and 86 degrees F). Keep container tightly closed. Throw away any unused medicine after the expiration date. NOTE: This sheet is a summary. It may not cover all possible information. If you have questions about this medicine, talk to your doctor, pharmacist, or health care provider.  2019 Elsevier/Gold Standard (2015-08-29 12:30:30) Amoxicillin;  Clavulanic Acid tablets What is this medicine? AMOXICILLIN; CLAVULANIC ACID (a mox i SIL in; KLAV yoo lan ic AS id) is a penicillin antibiotic. It is used to treat certain kinds of bacterial infections. It will not work for colds, flu, or other viral infections. This medicine may be used for other purposes; ask your health care provider or pharmacist if you have questions. COMMON BRAND NAME(S): Augmentin What should I tell my health care provider before I take this medicine? They need to know if you have any of these conditions: -bowel disease, like colitis -kidney disease -liver disease -mononucleosis -an unusual or allergic reaction to amoxicillin, penicillin, cephalosporin, other antibiotics, clavulanic acid, other medicines, foods, dyes, or preservatives -pregnant or trying to get pregnant -breast-feeding How should I use this medicine? Take this medicine by mouth with a full glass of water. Follow the directions on the prescription label. Take at the start of a meal. Do not crush or chew. If the tablet has a score line, you may cut it in half at the score line for easier swallowing. Take your medicine at regular intervals. Do not take your medicine more often than directed. Take all of your medicine as directed even if you think you are better. Do not skip doses or stop your medicine early. Talk to your pediatrician regarding the use of this medicine in children. Special care may be needed. Overdosage: If you think you have taken too much of this medicine contact a poison control center or emergency room at once. NOTE: This medicine is only for you. Do not share this medicine with  others. What if I miss a dose? If you miss a dose, take it as soon as you can. If it is almost time for your next dose, take only that dose. Do not take double or extra doses. What may interact with this medicine? -allopurinol -anticoagulants -birth control pills -methotrexate -probenecid This list may not  describe all possible interactions. Give your health care provider a list of all the medicines, herbs, non-prescription drugs, or dietary supplements you use. Also tell them if you smoke, drink alcohol, or use illegal drugs. Some items may interact with your medicine. What should I watch for while using this medicine? Tell your doctor or health care professional if your symptoms do not improve. Do not treat diarrhea with over the counter products. Contact your doctor if you have diarrhea that lasts more than 2 days or if it is severe and watery. If you have diabetes, you may get a false-positive result for sugar in your urine. Check with your doctor or health care professional. Birth control pills may not work properly while you are taking this medicine. Talk to your doctor about using an extra method of birth control. What side effects may I notice from receiving this medicine? Side effects that you should report to your doctor or health care professional as soon as possible: -allergic reactions like skin rash, itching or hives, swelling of the face, lips, or tongue -breathing problems -dark urine -fever or chills, sore throat -redness, blistering, peeling or loosening of the skin, including inside the mouth -seizures -trouble passing urine or change in the amount of urine -unusual bleeding, bruising -unusually weak or tired -white patches or sores in the mouth or throat Side effects that usually do not require medical attention (report to your doctor or health care professional if they continue or are bothersome): -diarrhea -dizziness -headache -nausea, vomiting -stomach upset -vaginal or anal irritation This list may not describe all possible side effects. Call your doctor for medical advice about side effects. You may report side effects to FDA at 1-800-FDA-1088. Where should I keep my medicine? Keep out of the reach of children. Store at room temperature below 25 degrees C (77  degrees F). Keep container tightly closed. Throw away any unused medicine after the expiration date. NOTE: This sheet is a summary. It may not cover all possible information. If you have questions about this medicine, talk to your doctor, pharmacist, or health care provider.  2019 Elsevier/Gold Standard (2007-10-20 12:04:30) Community-Acquired Pneumonia, Adult Pneumonia is an infection of the lungs. It causes swelling in the airways of the lungs. Mucus and fluid may also build up inside the airways. One type of pneumonia can happen while a person is in a hospital. A different type can happen when a person is not in a hospital (community-acquired pneumonia).  What are the causes?  This condition is caused by germs (viruses, bacteria, or fungi). Some types of germs can be passed from one person to another. This can happen when you breathe in droplets from the cough or sneeze of an infected person. What increases the risk? You are more likely to develop this condition if you:  Have a long-term (chronic) disease, such as: ? Chronic obstructive pulmonary disease (COPD). ? Asthma. ? Cystic fibrosis. ? Congestive heart failure. ? Diabetes. ? Kidney disease.  Have HIV.  Have sickle cell disease.  Have had your spleen removed.  Do not take good care of your teeth and mouth (poor dental hygiene).  Have a medical condition that  increases the risk of breathing in droplets from your own mouth and nose.  Have a weakened body defense system (immune system).  Are a smoker.  Travel to areas where the germs that cause this illness are common.  Are around certain animals or the places they live. What are the signs or symptoms?  A dry cough.  A wet (productive) cough.  Fever.  Sweating.  Chest pain. This often happens when breathing deeply or coughing.  Fast breathing or trouble breathing.  Shortness of breath.  Shaking chills.  Feeling tired (fatigue).  Muscle aches. How is  this treated? Treatment for this condition depends on many things. Most adults can be treated at home. In some cases, treatment must happen in a hospital. Treatment may include:  Medicines given by mouth or through an IV tube.  Being given extra oxygen.  Respiratory therapy. In rare cases, treatment for very bad pneumonia may include:  Using a machine to help you breathe.  Having a procedure to remove fluid from around your lungs. Follow these instructions at home: Medicines  Take over-the-counter and prescription medicines only as told by your doctor. ? Only take cough medicine if you are losing sleep.  If you were prescribed an antibiotic medicine, take it as told by your doctor. Do not stop taking the antibiotic even if you start to feel better. General instructions   Sleep with your head and neck raised (elevated). You can do this by sleeping in a recliner or by putting a few pillows under your head.  Rest as needed. Get at least 8 hours of sleep each night.  Drink enough water to keep your pee (urine) pale yellow.  Eat a healthy diet that includes plenty of vegetables, fruits, whole grains, low-fat dairy products, and lean protein.  Do not use any products that contain nicotine or tobacco. These include cigarettes, e-cigarettes, and chewing tobacco. If you need help quitting, ask your doctor.  Keep all follow-up visits as told by your doctor. This is important. How is this prevented? A shot (vaccine) can help prevent pneumonia. Shots are often suggested for:  People older than 51 years of age.  People older than 51 years of age who: ? Are having cancer treatment. ? Have long-term (chronic) lung disease. ? Have problems with their body's defense system. You may also prevent pneumonia if you take these actions:  Get the flu (influenza) shot every year.  Go to the dentist as often as told.  Wash your hands often. If you cannot use soap and water, use hand  sanitizer. Contact a doctor if:  You have a fever.  You lose sleep because your cough medicine does not help. Get help right away if:  You are short of breath and it gets worse.  You have more chest pain.  Your sickness gets worse. This is very serious if: ? You are an older adult. ? Your body's defense system is weak.  You cough up blood. Summary  Pneumonia is an infection of the lungs.  Most adults can be treated at home. Some will need treatment in a hospital.  Drink enough water to keep your pee pale yellow.  Get at least 8 hours of sleep each night. This information is not intended to replace advice given to you by your health care provider. Make sure you discuss any questions you have with your health care provider. Document Released: 01/13/2008 Document Revised: 03/24/2018 Document Reviewed: 03/24/2018 Elsevier Interactive Patient Education  2019 [employer].  Pharyngitis  Pharyngitis is a sore throat (pharynx). This is when there is redness, pain, and swelling in your throat. Most of the time, this condition gets better on its own. In some cases, you may need medicine. Follow these instructions at home:  Take over-the-counter and prescription medicines only as told by your doctor. ? If you were prescribed an antibiotic medicine, take it as told by your doctor. Do not stop taking the antibiotic even if you start to feel better. ? Do not give children aspirin. Aspirin has been linked to Reye syndrome.  Drink enough water and fluids to keep your pee (urine) clear or pale yellow.  Get a lot of rest.  Rinse your mouth (gargle) with a salt-water mixture 3-4 times a day or as needed. To make a salt-water mixture, completely dissolve -1 tsp of salt in 1 cup of warm water.  If your doctor approves, you may use throat lozenges or sprays to soothe your throat. Contact a doctor if:  You have large, tender lumps in your neck.  You have a rash.  You cough up green,  yellow-brown, or bloody spit. Get help right away if:  You have a stiff neck.  You drool or cannot swallow liquids.  You cannot drink or take medicines without throwing up.  You have very bad pain that does not go away with medicine.  You have problems breathing, and it is not from a stuffy nose.  You have new pain and swelling in your knees, ankles, wrists, or elbows. Summary  Pharyngitis is a sore throat (pharynx). This is when there is redness, pain, and swelling in your throat.  If you were prescribed an antibiotic medicine, take it as told by your doctor. Do not stop taking the antibiotic even if you start to feel better.  Most of the time, pharyngitis gets better on its own. Sometimes, you may need medicine. This information is not intended to replace advice given to you by your health care provider. Make sure you discuss any questions you have with your health care provider. Document Released: 01/13/2008 Document Revised: 09/01/2016 Document Reviewed: 09/01/2016 Elsevier Interactive Patient Education  2019 [employer].

## 2018-09-24 LAB — CBC WITH DIFFERENTIAL/PLATELET
BASOS ABS: 0.1 10*3/uL (ref 0.0–0.2)
Basos: 1 %
EOS (ABSOLUTE): 0.3 10*3/uL (ref 0.0–0.4)
EOS: 5 %
HEMOGLOBIN: 14 g/dL (ref 11.1–15.9)
Hematocrit: 41.9 % (ref 34.0–46.6)
IMMATURE GRANS (ABS): 0 10*3/uL (ref 0.0–0.1)
Immature Granulocytes: 0 %
Lymphocytes Absolute: 1.8 10*3/uL (ref 0.7–3.1)
Lymphs: 28 %
MCH: 31 pg (ref 26.6–33.0)
MCHC: 33.4 g/dL (ref 31.5–35.7)
MCV: 93 fL (ref 79–97)
MONOCYTES: 6 %
Monocytes Absolute: 0.4 10*3/uL (ref 0.1–0.9)
NEUTROS ABS: 3.9 10*3/uL (ref 1.4–7.0)
Neutrophils: 60 %
Platelets: 297 10*3/uL (ref 150–450)
RBC: 4.51 x10E6/uL (ref 3.77–5.28)
RDW: 12.6 % (ref 11.7–15.4)
WBC: 6.5 10*3/uL (ref 3.4–10.8)

## 2018-09-24 LAB — COMPREHENSIVE METABOLIC PANEL
ALBUMIN: 4.5 g/dL (ref 3.8–4.8)
ALT: 17 IU/L (ref 0–32)
AST: 18 IU/L (ref 0–40)
Albumin/Globulin Ratio: 1.7 (ref 1.2–2.2)
Alkaline Phosphatase: 89 IU/L (ref 39–117)
BUN/Creatinine Ratio: 12 (ref 9–23)
BUN: 11 mg/dL (ref 6–24)
Bilirubin Total: 0.3 mg/dL (ref 0.0–1.2)
CALCIUM: 9.7 mg/dL (ref 8.7–10.2)
CHLORIDE: 101 mmol/L (ref 96–106)
CO2: 23 mmol/L (ref 20–29)
Creatinine, Ser: 0.91 mg/dL (ref 0.57–1.00)
GFR calc Af Amer: 85 mL/min/{1.73_m2} (ref >59)
GFR calc non Af Amer: 74 mL/min/{1.73_m2} (ref >59)
GLUCOSE: 103 mg/dL — AB (ref 65–99)
Globulin, Total: 2.7 g/dL (ref 1.5–4.5)
Potassium: 4.4 mmol/L (ref 3.5–5.2)
Sodium: 139 mmol/L (ref 134–144)
TOTAL PROTEIN: 7.2 g/dL (ref 6.0–8.5)

## 2018-09-26 ENCOUNTER — Telehealth: Payer: Self-pay

## 2018-09-26 NOTE — Telephone Encounter (Signed)
Contacted the patient with her lab results. She is feeling better since her visit and will F/U with her PCP in the next few weeks

## 2018-09-26 NOTE — Progress Notes (Signed)
Bretlyn,  Please let patient know that labs show no sign of infection or anemia.  Her kidney and liver function is normal.  Glucose was elevated but patient was not fasting.  Chest x-ray is normal.  All of these are reassuring, please have patient continue current treatment plan as discussed in office with follow-up preferably with her primary care physician.  Please verify how she is feeling. Thanks, Laverna Peace MSN, AGNP-C, FNP-C

## 2018-09-26 NOTE — Progress Notes (Signed)
Normal chest x-ray.

## 2018-09-27 ENCOUNTER — Ambulatory Visit: Payer: Managed Care, Other (non HMO) | Admitting: Cardiovascular Disease

## 2018-09-27 ENCOUNTER — Emergency Department: Payer: Managed Care, Other (non HMO)

## 2018-09-27 ENCOUNTER — Other Ambulatory Visit: Payer: Self-pay

## 2018-09-27 ENCOUNTER — Emergency Department
Admission: EM | Admit: 2018-09-27 | Discharge: 2018-09-27 | Disposition: A | Discharge status: Home / Self Care | Payer: Managed Care, Other (non HMO) | Attending: Emergency Medicine | Admitting: Emergency Medicine

## 2018-09-27 ENCOUNTER — Encounter: Payer: Self-pay | Admitting: Cardiovascular Disease

## 2018-09-27 ENCOUNTER — Ambulatory Visit: Payer: Self-pay | Admitting: Adult Health

## 2018-09-27 VITALS — BP 126/66 | HR 77 | Ht 63.0 in | Wt 215.0 lb

## 2018-09-27 VITALS — BP 130/76 | HR 60 | Temp 98.2°F | Resp 16

## 2018-09-27 DIAGNOSIS — R0602 Shortness of breath: Secondary | ICD-10-CM

## 2018-09-27 DIAGNOSIS — J45909 Unspecified asthma, uncomplicated: Secondary | ICD-10-CM | POA: Insufficient documentation

## 2018-09-27 DIAGNOSIS — R079 Chest pain, unspecified: Secondary | ICD-10-CM | POA: Diagnosis not present

## 2018-09-27 DIAGNOSIS — R0789 Other chest pain: Secondary | ICD-10-CM | POA: Diagnosis not present

## 2018-09-27 DIAGNOSIS — Z79899 Other long term (current) drug therapy: Secondary | ICD-10-CM | POA: Diagnosis not present

## 2018-09-27 DIAGNOSIS — R059 Cough, unspecified: Secondary | ICD-10-CM

## 2018-09-27 DIAGNOSIS — Z91040 Latex allergy status: Secondary | ICD-10-CM | POA: Diagnosis not present

## 2018-09-27 DIAGNOSIS — R05 Cough: Secondary | ICD-10-CM

## 2018-09-27 DIAGNOSIS — R109 Unspecified abdominal pain: Secondary | ICD-10-CM

## 2018-09-27 LAB — BASIC METABOLIC PANEL
Anion gap: 4 — ABNORMAL LOW (ref 5–15)
BUN: 19 mg/dL (ref 6–20)
CO2: 31 mmol/L (ref 22–32)
Calcium: 10 mg/dL (ref 8.9–10.3)
Chloride: 106 mmol/L (ref 98–111)
Creatinine, Ser: 0.72 mg/dL (ref 0.44–1.00)
GFR calc Af Amer: >60 mL/min (ref >60)
GFR calc non Af Amer: >60 mL/min (ref >60)
Glucose, Bld: 102 mg/dL — ABNORMAL HIGH (ref 70–99)
Potassium: 4.2 mmol/L (ref 3.5–5.1)
Sodium: 141 mmol/L (ref 135–145)

## 2018-09-27 LAB — CBC
HCT: 42.9 % (ref 36.0–46.0)
Hemoglobin: 13.9 g/dL (ref 12.0–15.0)
MCH: 30.9 pg (ref 26.0–34.0)
MCHC: 32.4 g/dL (ref 30.0–36.0)
MCV: 95.3 fL (ref 80.0–100.0)
NRBC: 0 % (ref 0.0–0.2)
Platelets: 330 10*3/uL (ref 150–400)
RBC: 4.5 MIL/uL (ref 3.87–5.11)
RDW: 13.5 % (ref 11.5–15.5)
WBC: 11.9 10*3/uL — ABNORMAL HIGH (ref 4.0–10.5)

## 2018-09-27 LAB — TROPONIN I: Troponin I: <0.03 ng/mL (ref <0.03)

## 2018-09-27 MED ORDER — SODIUM CHLORIDE 0.9% FLUSH: 3.0000 mL | Freq: Once | INTRAVENOUS | Status: DC

## 2018-09-27 NOTE — Patient Instructions (Signed)

## 2018-09-27 NOTE — ED Triage Notes (Signed)
Pt states [facility] to left sided chest pain and epigastric pain that woke her from sleep this morning around 715 lasting about 30-26mn. Denies SOB, diaphoresis or other sx. Denies pain at present.

## 2018-09-27 NOTE — Progress Notes (Signed)
Cardiology Office Note   Date:  09/27/2018   ID:  Zareth, Rippetoe 04/15/1966, MRN [id number]  PCP:  Albina Billet, MD  Cardiologist:   Kathlyn Sacramento, MD   Chief Complaint  Patient presents with  . New Patient (Initial Visit)    ED chest pain SOB has been sick with the flu. Medications reveiwed verbally with patient.      History of Present Illness: Denise Gamble is a 53 y.o. female who was referred by Laverna Peace for evaluation of chest pain. The patient has no prior cardiac history and no significant chronic medical conditions.  No hypertension, hyperlipidemia or diabetes.  She is not a smoker.  She does have known history of asthma and thoracic outlet syndrome. She also reports GERD and she takes omeprazole. She has a somewhat complicated family history.  Her mother and aunt both had Epstein anomaly.  Her grandfather died of myocardial infarction at the age of 4.  Her brother died of what seems to be aortic dissection at the age of 70. She had 2 episodes of chest pain which were identical in description.  The first 1 happened about 1 month ago as she woke up from sleep.  It was substernal sharp discomfort with burning sensation that radiated to the right side.  It was severe in intensity and she went to the emergency room.  Basic work-up with troponin was negative.  She had another episode this morning and was the same intensity and description and went again to the emergency room with negative work-up.  She never had exertional chest pain.  She does describe exertional dyspnea.   Past Medical History:  Diagnosis Date  . Asthma   . Miscarriage 2013   multiple   . Thoracic outlet syndrome     Past Surgical History:  Procedure Laterality Date  . LYMPHADENECTOMY       Current Outpatient Medications  Medication Sig Dispense Refill  . ADVAIR HFA 45-21 MCG/ACT inhaler Inhale 2 puffs into the lungs 2 (two) times daily.    Marland Kitchen albuterol (PROAIR HFA) 108 (90  Base) MCG/ACT inhaler Inhale 2 puffs into the lungs every 4 (four) hours as needed for wheezing or shortness of breath. 1 Inhaler 12  . amoxicillin-clavulanate (AUGMENTIN) 875-125 MG tablet Take 1 tablet by mouth 2 (two) times daily. 20 tablet 0  . cetirizine (ZYRTEC) 10 MG tablet Take 10 mg by mouth.    Marland Kitchen ibuprofen (ADVIL,MOTRIN) 200 MG tablet Take 200 mg by mouth every 6 (six) hours as needed.    . meloxicam (MOBIC) 15 MG tablet TAKE 1 TABLET BY MOUTH ONCE DAILY FOR 30 DAYS    . montelukast (SINGULAIR) 10 MG tablet TAKE ONE TABLET BY MOUTH AT BEDTIME 90 tablet 3  . omeprazole (PRILOSEC) 20 MG capsule Take 20 mg by mouth daily.    Marland Kitchen oseltamivir (TAMIFLU) 75 MG capsule TAKE 1 CAPSULE BY MOUTH TWICE A DAY FOR 5 DAYS    . oxybutynin (DITROPAN-XL) 10 MG 24 hr tablet Take 1 tablet (10 mg total) by mouth at bedtime. 30 tablet 6  . predniSONE (STERAPRED UNI-PAK 21 TAB) 10 MG (21) TBPK tablet PO: Take 6 tablets on day 1:Take 5 tablets day 2:Take 4 tablets day 3: Take 3 tablets day 4:Take 2 tablets day five: 5 Take 1 tablet day 6 21 tablet 0   Current Facility-Administered Medications  Medication Dose Route Frequency Provider Last Rate Last Dose  . betamethasone acetate-betamethasone sodium phosphate (CELESTONE)  injection 3 mg  3 mg Intramuscular Once Edrick Kins, DPM        Allergies:   Pollen extract and Latex    Social History:  The patient  reports that she has never smoked. She has never used smokeless tobacco. She reports that she does not drink alcohol or use drugs.   Family History:  The patient's family history includes Colon cancer (age of onset: 22) in her father; Coronary artery disease in her sister; Heart disease in her brother; Heart disease (age of onset: 16) in her mother; Leukemia in her father; Skin cancer in her father.    ROS:  Please see the history of present illness.   Otherwise, review of systems are positive for none.   All other systems are reviewed and negative.     PHYSICAL EXAM: VS:  BP 126/66 (BP Location: Right Arm, Patient Position: Sitting)   Pulse 77   Ht 5' 3"  (1.6 m)   Wt 215 lb (97.5 kg)   LMP 03/28/2018 (Exact Date)   SpO2 98%   BMI 38.09 kg/m  , BMI Body mass index is 38.09 kg/m. GEN: Well nourished, well developed, in no acute distress  HEENT: normal  Neck: no JVD, carotid bruits, or masses Cardiac: RRR; no murmurs, rubs, or gallops,no edema  Respiratory:  clear to auscultation bilaterally, normal work of breathing GI: soft, nontender, nondistended, + BS MS: no deformity or atrophy  Skin: warm and dry, no rash Neuro:  Strength and sensation are intact Psych: euthymic mood, full affect   EKG:  EKG is not ordered today. I reviewed EKGs done in the emergency room which showed sinus rhythm and sinus bradycardia with low voltage and no significant ST or T wave changes.   Recent Labs: 09/23/2018: ALT 17 09/27/2018: BUN 19; Creatinine, Ser 0.72; Hemoglobin 13.9; Platelets 330; Potassium 4.2; Sodium 141    Lipid Panel    Component Value Date/Time   CHOL 188 01/14/2018 0914   TRIG 73 01/14/2018 0914   HDL 46 01/14/2018 0914   CHOLHDL 4.1 01/14/2018 0914   CHOLHDL 3 12/14/2016 1032   VLDL 14.4 12/14/2016 1032   LDLCALC 127 (H) 01/14/2018 0914      Wt Readings from Last 3 Encounters:  09/27/18 215 lb (97.5 kg)  09/27/18 214 lb (97.1 kg)  08/25/18 214 lb (97.1 kg)       PAD Screen 09/27/2018  Previous PAD dx? Yes  Previous surgical procedure? No  Pain with walking? Yes  Subsides with rest? Yes  Feet/toe relief with dangling? Yes  Painful, non-healing ulcers? No  Extremities discolored? No      ASSESSMENT AND PLAN:  1.  Atypical chest pain: Could be due to GERD but she has risk factors for coronary artery disease and thus I recommend evaluation with a Lexiscan Myoview.  She is not able to exercise on a treadmill due to a foot injury. If cardiac work-up is negative, consider switching to a more potent PPI or  referral to GI if she continues to have symptoms.  2.  Exertional dyspnea: She has family history of congenital heart disease and I ordered an echocardiogram for evaluation.    Disposition:   FU with me as needed  Signed,  Kathlyn Sacramento, MD  09/27/2018 2:13 PM    [place]

## 2018-09-27 NOTE — Patient Instructions (Signed)
Medication Instructions:  No changes If you need a refill on your cardiac medications before your next appointment, please call your pharmacy.   Lab work: None ordered  Testing/Procedures: Your physician has requested that you have an echocardiogram. Echocardiography is a painless test that uses sound waves to create images of your heart. It provides your doctor with information about the size and shape of your heart and how well your heart's chambers and valves are working. You may receive an ultrasound enhancing agent through an IV if needed to better visualize your heart during the echo.This procedure takes approximately one hour. There are no restrictions for this procedure. This will take place at the [facility] clinic.   Your physician has requested that you have a lexiscan myoview. For further information please visit [email or web address]. Please follow instruction sheet, as given.   Follow-Up: As needed with Dr. Fletcher Anon  Any Other Special Instructions Will Be Listed Below (If Applicable).  [place]  Your provider has ordered a Stress Test with nuclear imaging. The purpose of this test is to evaluate the blood supply to your heart muscle. This procedure is referred to as a "Non-Invasive Stress Test." This is because other than having an IV started in your vein, nothing is inserted or "invades" your body. Cardiac stress tests are done to find areas of poor blood flow to the heart by determining the extent of coronary artery disease (CAD). Some patients exercise on a treadmill, which naturally increases the blood flow to your heart, while others who are unable to walk on a treadmill due to physical limitations have a pharmacologic/chemical stress agent called Lexiscan . This medicine will mimic walking on a treadmill by temporarily increasing your coronary blood flow.   Please note: these test may take anywhere between 2-4 hours to complete  PLEASE REPORT TO [facility] AT THE FIRST DESK WILL DIRECT YOU WHERE TO GO  Date of Procedure:_____________________________________  Arrival Time for Procedure:______________________________  Instructions regarding medication:  Nothing to hold  PLEASE NOTIFY THE OFFICE AT LEAST 24 HOURS IN ADVANCE IF YOU ARE UNABLE TO KEEP YOUR APPOINTMENT.  [phone] AND  PLEASE NOTIFY NUCLEAR MEDICINE AT [facility] AT LEAST 24 HOURS IN ADVANCE IF YOU ARE UNABLE TO KEEP YOUR APPOINTMENT. [phone]  How to prepare for your Myoview test:  1. Do not eat or drink after midnight 2. No caffeine for 24 hours prior to test 3. No smoking 24 hours prior to test. 4. Your medication may be taken with water.  If your doctor stopped a medication because of this test, do not take that medication. 5. Ladies, please do not wear dresses.  Skirts or pants are appropriate. Please wear a short sleeve shirt. 6. No perfume, cologne or lotion. 7. Wear comfortable walking shoes. No heels!

## 2018-09-27 NOTE — Progress Notes (Addendum)
Subjective:     Patient ID: Denise Gamble, female   DOB: October 26, 1965, 53 y.o.   MRN: [id number]  HPI   Blood pressure 130/76, pulse 60, temperature 98.2 F (36.8 C), temperature source Oral, resp. rate 16, last menstrual period 03/28/2018, SpO2 96 %. Patient is a 53 year old female with  5/10 left substernal chest pain that radiated over her entire left side of her chest above her breast that started this morning while in the bed.   She reports she noticed during this episode that her heart rate on her Iphone watch went from 78 to 51 and stayed there for at least five minutes.  She denies any indigestion or burning.  Exertion and rest did not change the pain.   She has no pain currently.   Denies any neck pain or jaw pain or other radiating symptoms.   She took Tums without any relief during this episode. She has had lightheadedness / dizziness last night with another episode as well as shortness of breath.  She also reports that she had mild palpitations 2 days ago that lasted around 5 minutes. She denied any inhaler use at the time- she was taking prednisone. She denies any overuse of caffeine.  Peak flow was 450 before seeing provider on 2/14 patient reports and then two days later was  Peak Flow 500 after starting prednisone. She reports her peak flow was 400-450 again today.  Nasal congestion.  She has not seen her PCP regularly.   Denies diaphoresis.   Mild cough now- improved. She reports her fevers resolved, she was seen on 09/23/2026 with a diagnosis of pneumonia of right middle lobe and given  Augmentin 875-125 mg and prednisone taper pack and she reports her congestion has decreased and that her fatigue is improved but still remains.   He has an extensive family history of cardiac disease and was scheduled to see Dr. and cardiology in March from her last emergency room visit on 08/25/2018 for chest pain after seeing Dr. Doylene Bode the clinic  Patient  denies any fever, body  aches,chills, rash, nausea, vomiting, or diarrhea.    Review of Systems  Constitutional: Positive for fatigue. Negative for activity change, appetite change, chills, diaphoresis, fever and unexpected weight change.  HENT: Positive for congestion and rhinorrhea. Negative for dental problem, drooling, ear discharge, ear pain, facial swelling, hearing loss, mouth sores, nosebleeds, postnasal drip, sinus pressure, sinus pain, sneezing, sore throat, tinnitus, trouble swallowing and voice change.        Resolving congestion   Eyes: Negative.   Respiratory: Positive for cough, chest tightness and shortness of breath. Negative for apnea, choking, wheezing and stridor.   Cardiovascular: Positive for chest pain. Negative for palpitations and leg swelling.  Gastrointestinal: Negative.   Endocrine: Negative.   Genitourinary: Negative.   Musculoskeletal: Negative.   Skin: Negative.   Neurological: Positive for dizziness and light-headedness. Negative for facial asymmetry and headaches.       This morning had these symptoms with chest pain and now currently resolved.   Hematological: Negative.   Psychiatric/Behavioral: Negative.        Objective:   Physical Exam Vitals signs reviewed.  Constitutional:      General: She is in acute distress.     Appearance: Normal appearance. She is well-developed and well-groomed. She is obese. She is not toxic-appearing or diaphoretic.  HENT:     Head: Normocephalic and atraumatic.     Salivary Glands: Right salivary gland is not  diffusely enlarged or tender. Left salivary gland is not diffusely enlarged or tender.  Eyes:     General: Lids are normal.     Extraocular Movements: Extraocular movements intact.     Conjunctiva/sclera: Conjunctivae normal.     Pupils: Pupils are equal, round, and reactive to light.  Neck:     Musculoskeletal: Full passive range of motion without pain, normal range of motion and neck supple.     Thyroid: No thyromegaly.      Vascular: No hepatojugular reflux or JVD.     Trachea: Trachea and phonation normal. No tracheal deviation.  Cardiovascular:     Rate and Rhythm: Normal rate and regular rhythm.     Pulses:          Radial pulses are 2+ on the right side and 2+ on the left side.       Dorsalis pedis pulses are 2+ on the right side and 2+ on the left side.     Heart sounds: Heart sounds not distant. No murmur. No systolic murmur.  Pulmonary:     Effort: Pulmonary effort is normal. No tachypnea, accessory muscle usage or respiratory distress.     Breath sounds: Normal breath sounds. No stridor, decreased air movement or transmitted upper airway sounds. No decreased breath sounds, wheezing, rhonchi or rales.     Comments: No adventitious sounds  Abdominal:     General: Bowel sounds are normal. There is no distension or abdominal bruit. There are no signs of injury.     Palpations: Abdomen is soft.     Tenderness: There is no right CVA tenderness, left CVA tenderness, guarding or rebound. Negative signs include Murphy's sign, Rovsing's sign, McBurney's sign, psoas sign and obturator sign.       Comments: Patient states " mild tenderness " upper abdomen with deep palpation.     Skin:    General: Skin is warm and dry.     Capillary Refill: Capillary refill takes less than 2 seconds.     Findings: No rash.     Nails: There is no clubbing.   Neurological:     General: No focal deficit present.     Mental Status: She is alert and oriented to person, place, and time.     Cranial Nerves: No cranial nerve deficit.     Motor: No weakness.  Psychiatric:        Mood and Affect: Mood normal. Mood is not anxious.        Behavior: Behavior normal. Behavior is not agitated. Behavior is cooperative.        Assessment:     Chest pain, unspecified type  Cough  Shortness of breath  Abdominal pain, unspecified abdominal location     Plan:     Chest x-ray on 214 was negative.  CBC and C-Met were  normal.  On exam no abnormal lung sounds were heard today.  She has been on Augmentin 875-125 mg 1 tablet 2 times daily for the last 4 days as well as prednisone taper.  Discussed with patient and her husband at length and given repeat episode of chest pain and duration of symptoms at rest and with activity, provider feels it would be in the best interest of patient to be evaluated in the emergency room to rule out cardiac etiology with cardiac labs, EKG, or pulmonary embolism.  No EKG available in the clinic today and no STAT labs available in this clinic.   Higher level of care recommended-  husband prefers to drive patient to Emergency room since in same area - denies need for transportation available  Provider also recommends patient see primary care physician for a routine physical and to establish primary care. Patient may chose provider of choice. Also gave the [facility] at [phone] or web site at [email or web address] to help assist with finding a primary care doctor. Patient understands this office is acute care office only and no primary care is done at this office.

## 2018-09-27 NOTE — ED Provider Notes (Signed)
[facility] Emergency Department Provider Note   ____________________________________________    I have reviewed the triage vital signs and the nursing notes.   HISTORY  Chief Complaint Chest Pain     HPI Denise Gamble is a 53 y.o. female who presents with complaints of chest pain, now resolved.  Patient reports that approximately 7 AM this morning, she woke up and was lying in bed when she developed a burning type sensation in her lower chest.  She sat up and symptoms continued for approximately 20 minutes and then resolved.  She does have a history of asthma but denies shortness of breath.  No pleurisy.  No calf pain or swelling.  Reports similar episode occurred approximately 1 month ago, was seen in the emergency department, records reviewed from this, is to follow-up with cardiology but unable to get appointment until March 21.  Currently feels quite well  Past Medical History:  Diagnosis Date  . Asthma   . Miscarriage 2013   multiple   . Thoracic outlet syndrome     Patient Active Problem List   Diagnosis Date Noted  . Routine physical examination 12/14/2016  . Peripheral neuropathy 12/14/2016  . Overweight 09/15/2016  . Asthma 09/14/2016    Past Surgical History:  Procedure Laterality Date  . LYMPHADENECTOMY      Prior to Admission medications   Medication Sig Start Date End Date Taking? Authorizing Provider  ADVAIR HFA [phone] MCG/ACT inhaler Inhale 2 puffs into the lungs 2 (two) times daily. 09/21/18   [provider]  albuterol (PROAIR HFA) 108 (90 Base) MCG/ACT inhaler Inhale 2 puffs into the lungs every 4 (four) hours as needed for wheezing or shortness of breath. 11/26/15   Fisher, Linden Dolin, PA-C  amoxicillin-clavulanate (AUGMENTIN) 875-125 MG tablet Take 1 tablet by mouth 2 (two) times daily. 09/23/18   Flinchum, Kelby Aline, FNP  cetirizine (ZYRTEC) 10 MG tablet Take 10 mg by mouth.    [provider]  ibuprofen  (ADVIL,MOTRIN) 200 MG tablet Take 200 mg by mouth every 6 (six) hours as needed.    [provider]  meloxicam (MOBIC) 15 MG tablet TAKE 1 TABLET BY MOUTH ONCE DAILY FOR 30 DAYS 08/29/18   [provider]  montelukast (SINGULAIR) 10 MG tablet TAKE ONE TABLET BY MOUTH AT BEDTIME 06/04/17   Fisher, Linden Dolin, PA-C  oseltamivir (TAMIFLU) 75 MG capsule TAKE 1 CAPSULE BY MOUTH TWICE A DAY FOR 5 DAYS 09/18/18   [provider]  oxybutynin (DITROPAN-XL) 10 MG 24 hr tablet Take 1 tablet (10 mg total) by mouth at bedtime. 05/17/17   Fisher, Linden Dolin, PA-C  predniSONE (STERAPRED UNI-PAK 21 TAB) 10 MG (21) TBPK tablet PO: Take 6 tablets on day 1:Take 5 tablets day 2:Take 4 tablets day 3: Take 3 tablets day 4:Take 2 tablets day five: 5 Take 1 tablet day 6 09/23/18   Flinchum, Kelby Aline, FNP     Allergies Pollen extract and Latex  Family History  Problem Relation Age of Onset  . Heart disease Mother 33       epstein anolmay   . Colon cancer Father 28  . Skin cancer Father        ? thinks melanoma?   . Leukemia Father        AAL  . Heart disease Brother   . Coronary artery disease Sister   . Breast cancer Neg Hx     Social History Social History  Tobacco Use  . Smoking status: Never Smoker  . Smokeless tobacco: Never Used  Substance Use Topics  . Alcohol use: No    Alcohol/week: 0.0 standard drinks  . Drug use: No    Review of Systems  Constitutional: No fever/chills Eyes: No visual changes.  ENT: No sore throat. Cardiovascular: As above Respiratory: Denies shortness of breath. Gastrointestinal: No abdominal pain.  Genitourinary: Negative for dysuria. Musculoskeletal: Negative for back pain. Skin: Negative for rash. Neurological: Negative for headache   ____________________________________________   PHYSICAL EXAM:  VITAL SIGNS: ED Triage Vitals  Enc Vitals Group     BP 09/27/18 0943 129/80     Pulse Rate 09/27/18 0943 (!) 48     Resp 09/27/18 0943  16     Temp 09/27/18 0943 97.9 F (36.6 C)     Temp Source 09/27/18 0943 Oral     SpO2 09/27/18 0943 98 %     Weight 09/27/18 0944 97.1 kg (214 lb)     Height 09/27/18 0944 1.6 m (5' 3" )     Head Circumference --      Peak Flow --      Pain Score 09/27/18 0944 0     Pain Loc --      Pain Edu? --      Excl. in [place]? --     Constitutional: Alert and oriented. No acute distress.  Eyes: Conjunctivae are normal.   Nose: No congestion/rhinnorhea. Mouth/Throat: Mucous membranes are moist.    Cardiovascular: Normal rate, regular rhythm. Grossly normal heart sounds.  Good peripheral circulation. Respiratory: Normal respiratory effort.  No retractions. Lungs CTAB. Gastrointestinal: Soft. No distention.  No CVA tenderness.  Musculoskeletal: No lower extremity tenderness nor edema.  Warm and well perfused Neurologic:  Normal speech and language. No gross focal neurologic deficits are appreciated.  Skin:  Skin is warm, dry and intact. No rash noted. Psychiatric: Mood and affect are normal. Speech and behavior are normal.  ____________________________________________   LABS (all labs ordered are listed, but only abnormal results are displayed)  Labs Reviewed  BASIC METABOLIC PANEL - Abnormal; Notable for the following components:      Result Value   Glucose, Bld 102 (*)    Anion gap 4 (*)    All other components within normal limits  CBC - Abnormal; Notable for the following components:   WBC 11.9 (*)    All other components within normal limits  TROPONIN I   ____________________________________________  EKG  ED ECG REPORT I, Lavonia Drafts, the attending physician, personally viewed and interpreted this ECG.  Date: 09/27/2018  Rhythm: Sinus bradycardia QRS Axis: normal Intervals: normal ST/T Wave abnormalities: normal Narrative Interpretation: no evidence of acute ischemia  ____________________________________________  RADIOLOGY  X-ray  normal ____________________________________________   PROCEDURES  Procedure(s) performed: No  Procedures   Critical Care performed: No ____________________________________________   INITIAL IMPRESSION / ASSESSMENT AND PLAN / ED COURSE  Pertinent labs & imaging results that were available during my care of the patient were reviewed by me and considered in my medical decision making (see chart for details).  In addition patient reports she was put on prednisone and Augmentin several days ago for what sounds like an upper respiratory infection, she did have influenza last week.  EKG is reassuring, mild sinus bradycardia with patient is chest pain-free.  Troponin is normal.  I have spoke with Dr. Saunders Revel to expedite cardiology outpatient follow-up, patient agrees with this plan.  Return precautions discussed   ____________________________________________  FINAL CLINICAL IMPRESSION(S) / ED DIAGNOSES  Final diagnoses:  Atypical chest pain        Note:  This document was prepared using Dragon voice recognition software and may include unintentional dictation errors.   Lavonia Drafts, MD 09/27/18 1154

## 2018-10-07 ENCOUNTER — Encounter
Admission: RE | Admit: 2018-10-07 | Discharge: 2018-10-07 | Disposition: A | Discharge status: Home / Self Care | Payer: Managed Care, Other (non HMO) | Source: Ambulatory Visit | Attending: Cardiovascular Disease | Admitting: Cardiovascular Disease

## 2018-10-07 DIAGNOSIS — R079 Chest pain, unspecified: Secondary | ICD-10-CM | POA: Insufficient documentation

## 2018-10-07 LAB — NM MYOCAR MULTI W/SPECT W/WALL MOTION / EF
CHL CUP MPHR: 170 {beats}/min
CSEPED: 0 min
Estimated workload: 1 METS
Exercise duration (sec): 0 s
LV dias vol: 47 mL (ref 46–106)
LV sys vol: 12 mL
Peak HR: 108 {beats}/min
Percent HR: 63 %
Rest HR: 72 {beats}/min
SDS: 6
SRS: 1
SSS: 4
TID: 0.77

## 2018-10-07 MED ORDER — REGADENOSON 0.4 MG/5ML IV SOLN
0.4000 mg | Freq: Once | INTRAVENOUS | Status: AC
  Administered 2018-10-07: 0.4 mg via INTRAVENOUS

## 2018-10-07 MED ORDER — TECHNETIUM TC 99M TETROFOSMIN IV KIT
9.8200 | PACK | Freq: Once | INTRAVENOUS | Status: AC | PRN
  Administered 2018-10-07: 9.82 via INTRAVENOUS

## 2018-10-07 MED ORDER — TECHNETIUM TC 99M TETROFOSMIN IV KIT
32.7200 | PACK | Freq: Once | INTRAVENOUS | Status: AC | PRN
  Administered 2018-10-07: 32.72 via INTRAVENOUS

## 2018-10-10 ENCOUNTER — Ambulatory Visit (INDEPENDENT_AMBULATORY_CARE_PROVIDER_SITE_OTHER): Payer: Managed Care, Other (non HMO)

## 2018-10-10 DIAGNOSIS — R0602 Shortness of breath: Secondary | ICD-10-CM

## 2018-10-12 ENCOUNTER — Telehealth: Payer: Self-pay | Admitting: *Deleted

## 2018-10-12 NOTE — Telephone Encounter (Signed)
Patient called and made aware that her ECHO and stress test came back normal. She stated that she had a third incident of pain in her right breast area. She stated that this was not as painful as the first time. Please see phone message.  She denied current pain. She will be calling her PCP about the next steps since her cardiac tests came back normal. She has been advised to call back if anything further is needed.

## 2018-10-18 ENCOUNTER — Other Ambulatory Visit (HOSPITAL_COMMUNITY): Payer: Self-pay | Admitting: Internal Medicine

## 2018-10-18 ENCOUNTER — Other Ambulatory Visit: Payer: Self-pay | Admitting: Internal Medicine

## 2018-10-18 DIAGNOSIS — R109 Unspecified abdominal pain: Secondary | ICD-10-CM

## 2018-10-24 ENCOUNTER — Ambulatory Visit
Admission: RE | Admit: 2018-10-24 | Discharge: 2018-10-24 | Disposition: A | Discharge status: Home / Self Care | Payer: Managed Care, Other (non HMO) | Source: Ambulatory Visit | Attending: Internal Medicine | Admitting: Internal Medicine

## 2018-10-24 ENCOUNTER — Other Ambulatory Visit: Payer: Self-pay

## 2018-10-24 DIAGNOSIS — R109 Unspecified abdominal pain: Secondary | ICD-10-CM | POA: Insufficient documentation

## 2018-10-28 ENCOUNTER — Other Ambulatory Visit: Payer: Self-pay

## 2018-11-04 ENCOUNTER — Ambulatory Visit: Payer: Self-pay | Admitting: Internal Medicine

## 2019-03-15 ENCOUNTER — Other Ambulatory Visit: Payer: Self-pay

## 2019-03-15 ENCOUNTER — Encounter: Payer: Self-pay | Admitting: Adult Health

## 2019-03-15 ENCOUNTER — Ambulatory Visit: Payer: Managed Care, Other (non HMO) | Admitting: Adult Health

## 2019-03-15 VITALS — BP 108/68 | HR 64 | Temp 98.1°F | Resp 16 | Ht 63.0 in | Wt 215.0 lb

## 2019-03-15 DIAGNOSIS — Z0189 Encounter for other specified special examinations: Secondary | ICD-10-CM | POA: Diagnosis not present

## 2019-03-15 DIAGNOSIS — Z008 Encounter for other general examination: Secondary | ICD-10-CM | POA: Diagnosis not present

## 2019-03-15 NOTE — Patient Instructions (Signed)
The Biometric exam is a brief physical with labs including glucose and cholesterol. This does not replace a full physical with a primary care provider, and additional recommended labs. This is an acute care clinic not for maintenance of chronic or long standing conditions.   Provider also recommends if you do not have a primary care provider for patient to establish care promptly.You can choose any provider of your choice at any facility of your choice, the below information is  just a resource to aid in you finding a primary care provider for routine health maintenance.   [facility]  PHYSICIAN/PROVIDER  REFERRAL LINE at [phone]  [email or web address] to help assist with finding a primary care doctor.   Helpful resources below of other primary care office's accepting new patients.    [facility]         [address]  [place]. [place] [address] [phone]   [facility]    [address] [place], [place] [address] [phone]   [facility] [address] [place], [place]  [phone]    [facility] [facility]  [address] [place] [address] [phone]    Follow up with primary care as needed for chronic and maintenance health care- can be seen in this employee clinic for acute care.    Health Maintenance, Female Adopting a healthy lifestyle and getting preventive care are important in promoting health and wellness. Ask your health care provider about:  The right schedule for you to have regular tests and exams.  Things you can do on your own to prevent diseases and keep yourself healthy. What should I know about diet, weight, and exercise? Eat a healthy diet   Eat a diet that includes plenty of vegetables, fruits, low-fat dairy products, and lean protein.  Do not eat a lot of foods that are high in solid fats, added sugars, or  sodium. Maintain a healthy weight Body mass index (BMI) is used to identify weight problems. It estimates body fat based on height and weight. Your health care provider can help determine your BMI and help you achieve or maintain a healthy weight. Get regular exercise Get regular exercise. This is one of the most important things you can do for your health. Most adults should:  Exercise for at least 150 minutes each week. The exercise should increase your heart rate and make you sweat (moderate-intensity exercise).  Do strengthening exercises at least twice a week. This is in addition to the moderate-intensity exercise.  Spend less time sitting. Even light physical activity can be beneficial. Watch cholesterol and blood lipids Have your blood tested for lipids and cholesterol at 51 years of age, then have this test every 5 years. Have your cholesterol levels checked more often if:  Your lipid or cholesterol levels are high.  You are older than 51 years of age.  You are at high risk for heart disease. What should I know about cancer screening? Depending on your health history and family history, you may need to have cancer screening at various ages. This may include screening for:  Breast cancer.  Cervical cancer.  Colorectal cancer.  Skin cancer.  Lung cancer. What should I know about heart disease, diabetes, and high blood pressure? Blood pressure and heart disease  High blood pressure causes heart disease and increases the risk of stroke. This is more likely to develop in people  who have high blood pressure readings, are of African descent, or are overweight.  Have your blood pressure checked: ? Every 3-5 years if you are 71-50 years of age. ? Every year if you are 33 years old or older. Diabetes Have regular diabetes screenings. This checks your fasting blood sugar level. Have the screening done:  Once every three years after age 35 if you are at a normal weight and have  a low risk for diabetes.  More often and at a younger age if you are overweight or have a high risk for diabetes. What should I know about preventing infection? Hepatitis B If you have a higher risk for hepatitis B, you should be screened for this virus. Talk with your health care provider to find out if you are at risk for hepatitis B infection. Hepatitis C Testing is recommended for:  Everyone born from [birth year] through 1963.  Anyone with known risk factors for hepatitis C. Sexually transmitted infections (STIs)  Get screened for STIs, including gonorrhea and chlamydia, if: ? You are sexually active and are younger than 51 years of age. ? You are older than 51 years of age and your health care provider tells you that you are at risk for this type of infection. ? Your sexual activity has changed since you were last screened, and you are at increased risk for chlamydia or gonorrhea. Ask your health care provider if you are at risk.  Ask your health care provider about whether you are at high risk for HIV. Your health care provider may recommend a prescription medicine to help prevent HIV infection. If you choose to take medicine to prevent HIV, you should first get tested for HIV. You should then be tested every 3 months for as long as you are taking the medicine. Pregnancy  If you are about to stop having your period (premenopausal) and you may become pregnant, seek counseling before you get pregnant.  Take 400 to 800 micrograms (mcg) of folic acid every day if you become pregnant.  Ask for birth control (contraception) if you want to prevent pregnancy. Osteoporosis and menopause Osteoporosis is a disease in which the bones lose minerals and strength with aging. This can result in bone fractures. If you are 14 years old or older, or if you are at risk for osteoporosis and fractures, ask your health care provider if you should:  Be screened for bone loss.  Take a calcium or vitamin D  supplement to lower your risk of fractures.  Be given hormone replacement therapy (HRT) to treat symptoms of menopause. Follow these instructions at home: Lifestyle  Do not use any products that contain nicotine or tobacco, such as cigarettes, e-cigarettes, and chewing tobacco. If you need help quitting, ask your health care provider.  Do not use street drugs.  Do not share needles.  Ask your health care provider for help if you need support or information about quitting drugs. Alcohol use  Do not drink alcohol if: ? Your health care provider tells you not to drink. ? You are pregnant, may be pregnant, or are planning to become pregnant.  If you drink alcohol: ? Limit how much you use to 0-1 drink a day. ? Limit intake if you are breastfeeding.  Be aware of how much alcohol is in your drink. In the [place] one drink equals one 12 oz bottle of beer (355 mL), one 5 oz glass of wine (148 mL), or one 1 oz glass of hard liquor (  44 mL). General instructions  Schedule regular health, dental, and eye exams.  Stay current with your vaccines.  Tell your health care provider if: ? You often feel depressed. ? You have ever been abused or do not feel safe at home. Summary  Adopting a healthy lifestyle and getting preventive care are important in promoting health and wellness.  Follow your health care provider's instructions about healthy diet, exercising, and getting tested or screened for diseases.  Follow your health care provider's instructions on monitoring your cholesterol and blood pressure. This information is not intended to replace advice given to you by your health care provider. Make sure you discuss any questions you have with your health care provider. Document Released: 02/09/2011 Document Revised: 07/20/2018 Document Reviewed: 07/20/2018 Elsevier Patient Education  2020 [employer].

## 2019-03-15 NOTE — Progress Notes (Signed)
[place] DOB: 53 y.o. MRN: [id number]  Subjective:  Here for Biometric Screen/brief exam Patient is a 53 year old female who comes to the clinic for a biometric screening and brief exam for her insurance with [place].  She recently moved into a new home and is enjoying working from home now with the Covid pandemic.  She is still seeing Dr. Cleda Mccreedy for her left foot tibialis tendinitis and Dr. Rowe Robert orthopedics for her pain in left hip. She denies any changes with either.   Her chest pain and stomach pain have resolved she was cleared by cardiology and gastroenterology. She denies any symptoms currently.   Patient  denies any fever, body aches,chills, rash, chest pain, shortness of breath, nausea, vomiting, or diarrhea.   Objective: Blood pressure 108/68, pulse 64, temperature 98.1 F (36.7 C), temperature source Temporal, resp. rate 16, height 5' 3"  (1.6 m), weight 215 lb (97.5 kg), last menstrual period 03/28/2018, SpO2 99 %. NAD, well developed, well nourished HEENT: Within normal limits Neck: Normal, supple, no lymphadenopathy  Heart: Regular rate and rhythm Lungs: Clear to auscultation no adventitious lung sounds noted,  Patient moves on and off of exam table and in room without difficulty. Gait is normal in hall and in room. Patient is oriented to person place time and situation. Patient answers questions appropriately and engages in conversation.  Assessment: Biometric screen  1. Encounter for other general examination- brief exam only with biometric screening not annual physical    2. Encounter for biometric screening      Plan:   Follow up with primary care as needed for chronic and maintenance health care- can be seen in this employee clinic for acute care.   Fasting glucose and lipids. Discussed with patient that today's visit here is a limited biometric screening visit (not a comprehensive exam  or management of any chronic problems) Discussed some health issues, including healthy eating habits and exercise. Encouraged to follow-up with PCP for annual comprehensive preventive and wellness care (and if applicable, any chronic issues). Questions invited and answered.  I will have the office call you on your glucose and cholesterol results when they return if you have not heard within 1 week please call the office.  This biometric physical is a brief physical and the only labs done are glucose and your lipid panel(cholesterol) and is  not a substitute for seeing a primary care provider for a complete annual physical. Please see a primary care physician for routine health maintenance, labs and full physical at least yearly and follow up as recommended by your provider. Provider also recommends if you do not have a primary care provider for patient to establish care as soon as possible .Patient may chose provider of choice. Also gave the [place] at [phone] or web site at [email or web address] to help assist with finding a primary care doctor.  Patient verbalizes understanding that his office is acute care only and not a substitute for a primary care or for the management of chronic conditions.

## 2019-03-16 LAB — LIPID PANEL WITH LDL/HDL RATIO
Cholesterol, Total: 197 mg/dL (ref 100–199)
HDL: 47 mg/dL (ref >39)
LDL Calculated: 133 mg/dL — ABNORMAL HIGH (ref 0–99)
LDl/HDL Ratio: 2.8 ratio (ref 0.0–3.2)
Triglycerides: 85 mg/dL (ref 0–149)
VLDL Cholesterol Cal: 17 mg/dL (ref 5–40)

## 2019-03-16 LAB — GLUCOSE, RANDOM: Glucose: 92 mg/dL (ref 65–99)

## 2019-03-21 ENCOUNTER — Encounter: Payer: Self-pay | Admitting: Adult Health

## 2019-10-24 ENCOUNTER — Other Ambulatory Visit: Payer: Self-pay

## 2019-10-24 ENCOUNTER — Encounter: Payer: Self-pay | Admitting: Family Medicine

## 2019-10-24 ENCOUNTER — Ambulatory Visit (INDEPENDENT_AMBULATORY_CARE_PROVIDER_SITE_OTHER): Payer: Managed Care, Other (non HMO) | Admitting: Family Medicine

## 2019-10-24 VITALS — BP 118/80 | HR 70 | Temp 97.8°F | Ht 63.0 in | Wt 225.3 lb

## 2019-10-24 DIAGNOSIS — M722 Plantar fascial fibromatosis: Secondary | ICD-10-CM

## 2019-10-24 DIAGNOSIS — F1520 Other stimulant dependence, uncomplicated: Secondary | ICD-10-CM

## 2019-10-24 DIAGNOSIS — G540 Brachial plexus disorders: Secondary | ICD-10-CM | POA: Insufficient documentation

## 2019-10-24 DIAGNOSIS — G629 Polyneuropathy, unspecified: Secondary | ICD-10-CM

## 2019-10-24 DIAGNOSIS — Z7689 Persons encountering health services in other specified circumstances: Secondary | ICD-10-CM

## 2019-10-24 DIAGNOSIS — R32 Unspecified urinary incontinence: Secondary | ICD-10-CM | POA: Insufficient documentation

## 2019-10-24 DIAGNOSIS — E785 Hyperlipidemia, unspecified: Secondary | ICD-10-CM

## 2019-10-24 DIAGNOSIS — J4520 Mild intermittent asthma, uncomplicated: Secondary | ICD-10-CM

## 2019-10-24 DIAGNOSIS — Z1211 Encounter for screening for malignant neoplasm of colon: Secondary | ICD-10-CM

## 2019-10-24 DIAGNOSIS — J3089 Other allergic rhinitis: Secondary | ICD-10-CM | POA: Insufficient documentation

## 2019-10-24 DIAGNOSIS — Z1159 Encounter for screening for other viral diseases: Secondary | ICD-10-CM

## 2019-10-24 DIAGNOSIS — E672 Megavitamin-B6 syndrome: Secondary | ICD-10-CM

## 2019-10-24 DIAGNOSIS — Z7189 Other specified counseling: Secondary | ICD-10-CM | POA: Diagnosis not present

## 2019-10-24 DIAGNOSIS — J45909 Unspecified asthma, uncomplicated: Secondary | ICD-10-CM | POA: Insufficient documentation

## 2019-10-24 DIAGNOSIS — Z0000 Encounter for general adult medical examination without abnormal findings: Secondary | ICD-10-CM | POA: Diagnosis not present

## 2019-10-24 DIAGNOSIS — M62838 Other muscle spasm: Secondary | ICD-10-CM
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
## 2019-10-24 NOTE — Patient Instructions (Signed)

## 2019-10-27 ENCOUNTER — Other Ambulatory Visit: Payer: Self-pay

## 2019-10-27 ENCOUNTER — Other Ambulatory Visit: Payer: Managed Care, Other (non HMO)

## 2019-10-27 DIAGNOSIS — Z0000 Encounter for general adult medical examination without abnormal findings: Secondary | ICD-10-CM

## 2019-10-27 DIAGNOSIS — E785 Hyperlipidemia, unspecified: Secondary | ICD-10-CM

## 2019-10-27 DIAGNOSIS — Z1159 Encounter for screening for other viral diseases: Secondary | ICD-10-CM

## 2019-10-27 DIAGNOSIS — G629 Polyneuropathy, unspecified: Secondary | ICD-10-CM

## 2019-10-27 DIAGNOSIS — Z7689 Persons encountering health services in other specified circumstances: Secondary | ICD-10-CM

## 2019-10-27 DIAGNOSIS — M62838 Other muscle spasm: Secondary | ICD-10-CM

## 2019-11-01 ENCOUNTER — Encounter: Payer: Self-pay | Admitting: Family Medicine

## 2019-11-03 ENCOUNTER — Encounter: Payer: Self-pay | Admitting: Family Medicine

## 2019-11-05 LAB — CBC WITH DIFFERENTIAL/PLATELET
Basophils Absolute: 0.1 10*3/uL (ref 0.0–0.2)
Basos: 1 %
EOS (ABSOLUTE): 0.4 10*3/uL (ref 0.0–0.4)
Eos: 6 %
Hematocrit: 38.7 % (ref 34.0–46.6)
Hemoglobin: 12.8 g/dL (ref 11.1–15.9)
Immature Grans (Abs): 0 10*3/uL (ref 0.0–0.1)
Immature Granulocytes: 0 %
Lymphocytes Absolute: 1.8 10*3/uL (ref 0.7–3.1)
Lymphs: 31 %
MCH: 30.8 pg (ref 26.6–33.0)
MCHC: 33.1 g/dL (ref 31.5–35.7)
MCV: 93 fL (ref 79–97)
Monocytes Absolute: 0.4 10*3/uL (ref 0.1–0.9)
Monocytes: 7 %
Neutrophils Absolute: 3.1 10*3/uL (ref 1.4–7.0)
Neutrophils: 55 %
Platelets: 264 10*3/uL (ref 150–450)
RBC: 4.15 x10E6/uL (ref 3.77–5.28)
RDW: 12.8 % (ref 11.7–15.4)
WBC: 5.7 10*3/uL (ref 3.4–10.8)

## 2019-11-05 LAB — COMPREHENSIVE METABOLIC PANEL
ALT: 13 IU/L (ref 0–32)
AST: 17 IU/L (ref 0–40)
Albumin/Globulin Ratio: 1.5 (ref 1.2–2.2)
Albumin: 4 g/dL (ref 3.8–4.9)
Alkaline Phosphatase: 113 IU/L (ref 39–117)
BUN/Creatinine Ratio: 12 (ref 9–23)
BUN: 11 mg/dL (ref 6–24)
Bilirubin Total: 0.4 mg/dL (ref 0.0–1.2)
CO2: 25 mmol/L (ref 20–29)
Calcium: 9.5 mg/dL (ref 8.7–10.2)
Chloride: 102 mmol/L (ref 96–106)
Creatinine, Ser: 0.91 mg/dL (ref 0.57–1.00)
GFR calc Af Amer: 84 mL/min/{1.73_m2} (ref >59)
GFR calc non Af Amer: 73 mL/min/{1.73_m2} (ref >59)
Globulin, Total: 2.7 g/dL (ref 1.5–4.5)
Glucose: 85 mg/dL (ref 65–99)
Potassium: 4.8 mmol/L (ref 3.5–5.2)
Sodium: 140 mmol/L (ref 134–144)
Total Protein: 6.7 g/dL (ref 6.0–8.5)

## 2019-11-05 LAB — LIPID PANEL
Chol/HDL Ratio: 5.3 ratio — ABNORMAL HIGH (ref 0.0–4.4)
Cholesterol, Total: 213 mg/dL — ABNORMAL HIGH (ref 100–199)
HDL: 40 mg/dL (ref >39)
LDL Chol Calc (NIH): 147 mg/dL — ABNORMAL HIGH (ref 0–99)
Triglycerides: 145 mg/dL (ref 0–149)
VLDL Cholesterol Cal: 26 mg/dL (ref 5–40)

## 2019-11-05 LAB — HEPATITIS C ANTIBODY: Hep C Virus Ab: <0.1 s/co ratio (ref 0.0–0.9)

## 2019-11-05 LAB — VITAMIN D 25 HYDROXY (VIT D DEFICIENCY, FRACTURES): Vit D, 25-Hydroxy: 29.2 ng/mL — ABNORMAL LOW (ref 30.0–100.0)

## 2019-11-05 LAB — PHOSPHORUS: Phosphorus: 2.9 mg/dL — ABNORMAL LOW (ref 3.0–4.3)

## 2019-11-05 LAB — VITAMIN B1: Thiamine: 105.3 nmol/L (ref 66.5–200.0)

## 2019-11-05 LAB — HEMOGLOBIN A1C
Est. average glucose Bld gHb Est-mCnc: 103 mg/dL
Hgb A1c MFr Bld: 5.2 % (ref 4.8–5.6)

## 2019-11-05 LAB — VITAMIN B12: Vitamin B-12: 750 pg/mL (ref 232–1245)

## 2019-11-05 LAB — T4, FREE: Free T4: 0.98 ng/dL (ref 0.82–1.77)

## 2019-11-05 LAB — MAGNESIUM: Magnesium: 2 mg/dL (ref 1.6–2.3)

## 2019-11-05 LAB — VITAMIN B6: Vitamin B6: 5.2 ug/L (ref 2.0–32.8)

## 2019-11-05 LAB — TSH: TSH: 3.37 u[IU]/mL (ref 0.450–4.500)

## 2019-11-17 ENCOUNTER — Other Ambulatory Visit: Payer: Self-pay | Admitting: Family Medicine

## 2019-11-17 DIAGNOSIS — J302 Other seasonal allergic rhinitis: Secondary | ICD-10-CM

## 2019-11-17 NOTE — Telephone Encounter (Incomplete)
Patient called says pharmacy has been sending refill request for these 2 meds :   --advised pt no request rcvd frm [facility]& the I see a Ashok Cordia as the last prescriber of these [place] was sending refill request to them instead of [place].  ---Forwarding New refill request to med asst for these prescriptions  :  montelukast (SINGULAIR) 10 MG tablet [[id number]]   Order Details Dose, Route, Frequency: As Directed  Dispense Quantity: 90 tablet Refills: 3   Note to Pharmacy: Please consider 90 day supplies to promote better adherence      Sig: TAKE ONE TABLET BY MOUTH AT BEDTIME       &  oxybutynin (DITROPAN-XL) 10 MG 24 hr tablet [[id number]]   Order Details Dose: 10 mg Route: Oral Frequency: Daily at bedtime  Dispense Quantity: 30 tablet Refills: 6       Sig: Take 1 tablet (10 mg total) by mouth at bedtime.      Start Date: 05/17/17 End Date: --  Written Date: 05/17/17 Expiration Date: 05/17/18  Providers  Authorizing Provider: Weldon Inches NPI: [id number]  DEA #: [id number]  Ordering User:  Versie Starks, PA-C    ----Patient as also switched pharmacies, now [place] is using mail order  :   [facility], [place] - [address] [phone] (Phone) [phone] (Fax)   --Dion Body

## 2019-11-20 NOTE — Telephone Encounter (Signed)
Please forward this to [name], [place] who is now caring for patients previously seen by Mina Marble NP.  Thank you.

## 2019-11-20 NOTE — Telephone Encounter (Signed)
Patient was seen by you on 10/24/19 for new patient apt.  These meds have not been prescribed by [place] before. Please advise. AS, CMA

## 2019-11-21 MED ORDER — MONTELUKAST SODIUM 10 MG PO TABS: 10.0000 mg | ORAL_TABLET | Freq: Every day | ORAL | 0 refills | Status: DC

## 2019-11-21 MED ORDER — OXYBUTYNIN CHLORIDE ER 10 MG PO TB24: 10.0000 mg | ORAL_TABLET | Freq: Every day | ORAL | 0 refills | Status: DC

## 2019-11-21 NOTE — Telephone Encounter (Signed)
This patient was a new patient 10/2019 with you and has never seen [facility]. AS, CMA

## 2019-12-01 DIAGNOSIS — R202 Paresthesia of skin: Secondary | ICD-10-CM | POA: Insufficient documentation

## 2019-12-13 ENCOUNTER — Encounter: Payer: Self-pay | Admitting: Physician Assistant

## 2019-12-13 ENCOUNTER — Other Ambulatory Visit: Payer: Self-pay

## 2019-12-13 ENCOUNTER — Ambulatory Visit (INDEPENDENT_AMBULATORY_CARE_PROVIDER_SITE_OTHER): Payer: Managed Care, Other (non HMO) | Admitting: Physician Assistant

## 2019-12-13 VITALS — BP 125/79 | HR 69 | Temp 98.4°F | Ht 63.0 in | Wt 221.3 lb

## 2019-12-13 DIAGNOSIS — Z01419 Encounter for gynecological examination (general) (routine) without abnormal findings: Secondary | ICD-10-CM

## 2019-12-13 DIAGNOSIS — Z1211 Encounter for screening for malignant neoplasm of colon: Secondary | ICD-10-CM | POA: Diagnosis not present

## 2019-12-13 DIAGNOSIS — Z23 Encounter for immunization: Secondary | ICD-10-CM | POA: Diagnosis not present

## 2019-12-13 DIAGNOSIS — Z1231 Encounter for screening mammogram for malignant neoplasm of breast: Secondary | ICD-10-CM

## 2019-12-13 DIAGNOSIS — R32 Unspecified urinary incontinence: Secondary | ICD-10-CM

## 2019-12-13 DIAGNOSIS — Z0000 Encounter for general adult medical examination without abnormal findings: Secondary | ICD-10-CM | POA: Diagnosis not present

## 2019-12-13 DIAGNOSIS — J4520 Mild intermittent asthma, uncomplicated: Secondary | ICD-10-CM

## 2019-12-13 DIAGNOSIS — J302 Other seasonal allergic rhinitis: Secondary | ICD-10-CM

## 2019-12-13 MED ORDER — OXYBUTYNIN CHLORIDE ER 10 MG PO TB24: 10.0000 mg | ORAL_TABLET | Freq: Every day | ORAL | 1 refills | Status: DC

## 2019-12-13 MED ORDER — MONTELUKAST SODIUM 10 MG PO TABS: 10.0000 mg | ORAL_TABLET | Freq: Every day | ORAL | 1 refills | Status: DC

## 2019-12-13 MED ORDER — ADVAIR HFA 45-21 MCG/ACT IN AERO: 2.0000 | INHALATION_SPRAY | Freq: Two times a day (BID) | RESPIRATORY_TRACT | 2 refills | Status: DC

## 2019-12-13 MED ORDER — ALBUTEROL SULFATE HFA 108 (90 BASE) MCG/ACT IN AERS: 2.0000 | INHALATION_SPRAY | RESPIRATORY_TRACT | 2 refills | Status: DC | PRN

## 2019-12-13 NOTE — Patient Instructions (Signed)
Preventive Care for Adults, Female  A healthy lifestyle and preventive care can promote health and wellness. Preventive health guidelines for women include the following key practices.   A routine yearly physical is a good way to check with your health care provider about your health and preventive screening. It is a chance to share any concerns and updates on your health and to receive a thorough exam.   Visit your dentist for a routine exam and preventive care every 6 months. Brush your teeth twice a day and floss once a day. Good oral hygiene prevents tooth decay and gum disease.   The frequency of eye exams is based on your age, health, family medical history, use of contact lenses, and other factors. Follow your health care provider's recommendations for frequency of eye exams.   Eat a healthy diet. Foods like vegetables, fruits, whole grains, low-fat dairy products, and lean protein foods contain the nutrients you need without too many calories. Decrease your intake of foods high in solid fats, added sugars, and salt. Eat the right amount of calories for you.Get information about a proper diet from your health care provider, if necessary.   Regular physical exercise is one of the most important things you can do for your health. Most adults should get at least 150 minutes of moderate-intensity exercise (any activity that increases your heart rate and causes you to sweat) each week. In addition, most adults need muscle-strengthening exercises on 2 or more days a week.   Maintain a healthy weight. The body mass index (BMI) is a screening tool to identify possible weight problems. It provides an estimate of body fat based on height and weight. Your health care provider can find your BMI, and can help you achieve or maintain a healthy weight.For adults 20 years and older:   - A BMI below 18.5 is considered underweight.   - A BMI of 18.5 to 24.9 is normal.   - A BMI of 25 to 29.9 is  considered overweight.   - A BMI of 30 and above is considered obese.   Maintain normal blood lipids and cholesterol levels by exercising and minimizing your intake of trans and saturated fats.  Eat a balanced diet with plenty of fruit and vegetables. Blood tests for lipids and cholesterol should begin at age 20 and be repeated every 5 years minimum.  If your lipid or cholesterol levels are high, you are over 40, or you are at high risk for heart disease, you may need your cholesterol levels checked more frequently.Ongoing high lipid and cholesterol levels should be treated with medicines if diet and exercise are not working.   If you smoke, find out from your health care provider how to quit. If you do not use tobacco, do not start.   Lung cancer screening is recommended for adults aged 55-80 years who are at high risk for developing lung cancer because of a history of smoking. A yearly low-dose CT scan of the lungs is recommended for people who have at least a 30-pack-year history of smoking and are a current smoker or have quit within the past 15 years. A pack year of smoking is smoking an average of 1 pack of cigarettes a day for 1 year (for example: 1 pack a day for 30 years or 2 packs a day for 15 years). Yearly screening should continue until the smoker has stopped smoking for at least 15 years. Yearly screening should be stopped for people who develop a   health problem that would prevent them from having lung cancer treatment.   If you are pregnant, do not drink alcohol. If you are breastfeeding, be very cautious about drinking alcohol. If you are not pregnant and choose to drink alcohol, do not have more than 1 drink per day. One drink is considered to be 12 ounces (355 mL) of beer, 5 ounces (148 mL) of wine, or 1.5 ounces (44 mL) of liquor.   Avoid use of street drugs. Do not share needles with anyone. Ask for help if you need support or instructions about stopping the use of  drugs.   High blood pressure causes heart disease and increases the risk of stroke. Your blood pressure should be checked at least yearly.  Ongoing high blood pressure should be treated with medicines if weight loss and exercise do not work.   If you are 69-55 years old, ask your health care provider if you should take aspirin to prevent strokes.   Diabetes screening involves taking a blood sample to check your fasting blood sugar level. This should be done once every 3 years, after age 38, if you are within normal weight and without risk factors for diabetes. Testing should be considered at a younger age or be carried out more frequently if you are overweight and have at least 1 risk factor for diabetes.   Breast cancer screening is essential preventive care for women. You should practice "breast self-awareness."  This means understanding the normal appearance and feel of your breasts and may include breast self-examination.  Any changes detected, no matter how small, should be reported to a health care provider.  Women in their 80s and 30s should have a clinical breast exam (CBE) by a health care provider as part of a regular health exam every 1 to 3 years.  After age 66, women should have a CBE every year.  Starting at age 1, women should consider having a mammogram (breast X-ray test) every year.  Women who have a family history of breast cancer should talk to their health care provider about genetic screening.  Women at a high risk of breast cancer should talk to their health care providers about having an MRI and a mammogram every year.   -Breast cancer gene (BRCA)-related cancer risk assessment is recommended for women who have family members with BRCA-related cancers. BRCA-related cancers include breast, ovarian, tubal, and peritoneal cancers. Having family members with these cancers may be associated with an increased risk for harmful changes (mutations) in the breast cancer genes BRCA1 and  BRCA2. Results of the assessment will determine the need for genetic counseling and BRCA1 and BRCA2 testing.   The Pap test is a screening test for cervical cancer. A Pap test can show cell changes on the cervix that might become cervical cancer if left untreated. A Pap test is a procedure in which cells are obtained and examined from the lower end of the uterus (cervix).   - Women should have a Pap test starting at age 57.   - Between ages 90 and 70, Pap tests should be repeated every 2 years.   - Beginning at age 63, you should have a Pap test every 3 years as long as the past 3 Pap tests have been normal.   - Some women have medical problems that increase the chance of getting cervical cancer. Talk to your health care provider about these problems. It is especially important to talk to your health care provider if a  new problem develops soon after your last Pap test. In these cases, your health care provider may recommend more frequent screening and Pap tests.   - The above recommendations are the same for women who have or have not gotten the vaccine for human papillomavirus (HPV).   - If you had a hysterectomy for a problem that was not cancer or a condition that could lead to cancer, then you no longer need Pap tests. Even if you no longer need a Pap test, a regular exam is a good idea to make sure no other problems are starting.   - If you are between ages 36 and 66 years, and you have had normal Pap tests going back 10 years, you no longer need Pap tests. Even if you no longer need a Pap test, a regular exam is a good idea to make sure no other problems are starting.   - If you have had past treatment for cervical cancer or a condition that could lead to cancer, you need Pap tests and screening for cancer for at least 20 years after your treatment.   - If Pap tests have been discontinued, risk factors (such as a new sexual partner) need to be reassessed to determine if screening should  be resumed.   - The HPV test is an additional test that may be used for cervical cancer screening. The HPV test looks for the virus that can cause the cell changes on the cervix. The cells collected during the Pap test can be tested for HPV. The HPV test could be used to screen women aged 70 years and older, and should be used in women of any age who have unclear Pap test results. After the age of 67, women should have HPV testing at the same frequency as a Pap test.   Colorectal cancer can be detected and often prevented. Most routine colorectal cancer screening begins at the age of 57 years and continues through age 26 years. However, your health care provider may recommend screening at an earlier age if you have risk factors for colon cancer. On a yearly basis, your health care provider may provide home test kits to check for hidden blood in the stool.  Use of a small camera at the end of a tube, to directly examine the colon (sigmoidoscopy or colonoscopy), can detect the earliest forms of colorectal cancer. Talk to your health care provider about this at age 23, when routine screening begins. Direct exam of the colon should be repeated every 5 -10 years through age 49 years, unless early forms of pre-cancerous polyps or small growths are found.   People who are at an increased risk for hepatitis B should be screened for this virus. You are considered at high risk for hepatitis B if:  -You were born in a country where hepatitis B occurs often. Talk with your health care provider about which countries are considered high risk.  - Your parents were born in a high-risk country and you have not received a shot to protect against hepatitis B (hepatitis B vaccine).  - You have HIV or AIDS.  - You use needles to inject street drugs.  - You live with, or have sex with, someone who has Hepatitis B.  - You get hemodialysis treatment.  - You take certain medicines for conditions like cancer, organ  transplantation, and autoimmune conditions.   Hepatitis C blood testing is recommended for all people born from [birth year] through 1965 and any individual  with known risks for hepatitis C.   Practice safe sex. Use condoms and avoid high-risk sexual practices to reduce the spread of sexually transmitted infections (STIs). STIs include gonorrhea, chlamydia, syphilis, trichomonas, herpes, HPV, and human immunodeficiency virus (HIV). Herpes, HIV, and HPV are viral illnesses that have no cure. They can result in disability, cancer, and death. Sexually active women aged 25 years and younger should be checked for chlamydia. Older women with new or multiple partners should also be tested for chlamydia. Testing for other STIs is recommended if you are sexually active and at increased risk.   Osteoporosis is a disease in which the bones lose minerals and strength with aging. This can result in serious bone fractures or breaks. The risk of osteoporosis can be identified using a bone density scan. Women ages 65 years and over and women at risk for fractures or osteoporosis should discuss screening with their health care providers. Ask your health care provider whether you should take a calcium supplement or vitamin D to There are also several preventive steps women can take to avoid osteoporosis and resulting fractures or to keep osteoporosis from worsening. -->Recommendations include:  Eat a balanced diet high in fruits, vegetables, calcium, and vitamins.  Get enough calcium. The recommended total intake of is 1,200 mg daily; for best absorption, if taking supplements, divide doses into 250-500 mg doses throughout the day. Of the two types of calcium, calcium carbonate is best absorbed when taken with food but calcium citrate can be taken on an empty stomach.  Get enough vitamin D. NAMS and the National Osteoporosis Foundation recommend at least 1,000 IU per day for women age 50 and over who are at risk of vitamin D  deficiency. Vitamin D deficiency can be caused by inadequate sun exposure (for example, those who live in northern latitudes).  Avoid alcohol and smoking. Heavy alcohol intake (more than 7 drinks per week) increases the risk of falls and hip fracture and women smokers tend to lose bone more rapidly and have lower bone mass than nonsmokers. Stopping smoking is one of the most important changes women can make to improve their health and decrease risk for disease.  Be physically active every day. Weight-bearing exercise (for example, fast walking, hiking, jogging, and weight training) may strengthen bones or slow the rate of bone loss that comes with aging. Balancing and muscle-strengthening exercises can reduce the risk of falling and fracture.  Consider therapeutic medications. Currently, several types of effective drugs are available. Healthcare providers can recommend the type most appropriate for each woman.  Eliminate environmental factors that may contribute to accidents. Falls cause nearly 90% of all osteoporotic fractures, so reducing this risk is an important bone-health strategy. Measures include ample lighting, removing obstructions to walking, using nonskid rugs on floors, and placing mats and/or grab bars in showers.  Be aware of medication side effects. Some common medicines make bones weaker. These include a type of steroid drug called glucocorticoids used for arthritis and asthma, some antiseizure drugs, certain sleeping pills, treatments for endometriosis, and some cancer drugs. An overactive thyroid gland or using too much thyroid hormone for an underactive thyroid can also be a problem. If you are taking these medicines, talk to your doctor about what you can do to help protect your bones.reduce the rate of osteoporosis.    Menopause can be associated with physical symptoms and risks. Hormone replacement therapy is available to decrease symptoms and risks. You should talk to your  health care provider   about whether hormone replacement therapy is right for you.   Use sunscreen. Apply sunscreen liberally and repeatedly throughout the day. You should seek shade when your shadow is shorter than you. Protect yourself by wearing long sleeves, pants, a wide-brimmed hat, and sunglasses year round, whenever you are outdoors.   Once a month, do a whole body skin exam, using a mirror to look at the skin on your back. Tell your health care provider of new moles, moles that have irregular borders, moles that are larger than a pencil eraser, or moles that have changed in shape or color.   -Stay current with required vaccines (immunizations).   Influenza vaccine. All adults should be immunized every year.  Tetanus, diphtheria, and acellular pertussis (Td, Tdap) vaccine. Pregnant women should receive 1 dose of Tdap vaccine during each pregnancy. The dose should be obtained regardless of the length of time since the last dose. Immunization is preferred during the 27th 36th week of gestation. An adult who has not previously received Tdap or who does not know her vaccine status should receive 1 dose of Tdap. This initial dose should be followed by tetanus and diphtheria toxoids (Td) booster doses every 10 years. Adults with an unknown or incomplete history of completing a 3-dose immunization series with Td-containing vaccines should begin or complete a primary immunization series including a Tdap dose. Adults should receive a Td booster every 10 years.  Varicella vaccine. An adult without evidence of immunity to varicella should receive 2 doses or a second dose if she has previously received 1 dose. Pregnant females who do not have evidence of immunity should receive the first dose after pregnancy. This first dose should be obtained before leaving the health care facility. The second dose should be obtained 4 8 weeks after the first dose.  Human papillomavirus (HPV) vaccine. Females aged 13 26  years who have not received the vaccine previously should obtain the 3-dose series. The vaccine is not recommended for use in pregnant females. However, pregnancy testing is not needed before receiving a dose. If a female is found to be pregnant after receiving a dose, no treatment is needed. In that case, the remaining doses should be delayed until after the pregnancy. Immunization is recommended for any person with an immunocompromised condition through the age of 26 years if she did not get any or all doses earlier. During the 3-dose series, the second dose should be obtained 4 8 weeks after the first dose. The third dose should be obtained 24 weeks after the first dose and 16 weeks after the second dose.  Zoster vaccine. One dose is recommended for adults aged 60 years or older unless certain conditions are present.  Measles, mumps, and rubella (MMR) vaccine. Adults born before 1957 generally are considered immune to measles and mumps. Adults born in 1957 or later should have 1 or more doses of MMR vaccine unless there is a contraindication to the vaccine or there is laboratory evidence of immunity to each of the three diseases. A routine second dose of MMR vaccine should be obtained at least 28 days after the first dose for students attending postsecondary schools, health care workers, or international travelers. People who received inactivated measles vaccine or an unknown type of measles vaccine during 1963 1967 should receive 2 doses of MMR vaccine. People who received inactivated mumps vaccine or an unknown type of mumps vaccine before 1979 and are at high risk for mumps infection should consider immunization with 2 doses of   MMR vaccine. For females of childbearing age, rubella immunity should be determined. If there is no evidence of immunity, females who are not pregnant should be vaccinated. If there is no evidence of immunity, females who are pregnant should delay immunization until after pregnancy.  Unvaccinated health care workers born before [birth year] who lack laboratory evidence of measles, mumps, or rubella immunity or laboratory confirmation of disease should consider measles and mumps immunization with 2 doses of MMR vaccine or rubella immunization with 1 dose of MMR vaccine.  Pneumococcal 13-valent conjugate (PCV13) vaccine. When indicated, a person who is uncertain of her immunization history and has no record of immunization should receive the PCV13 vaccine. An adult aged 54 years or older who has certain medical conditions and has not been previously immunized should receive 1 dose of PCV13 vaccine. This PCV13 should be followed with a dose of pneumococcal polysaccharide (PPSV23) vaccine. The PPSV23 vaccine dose should be obtained at least 8 weeks after the dose of PCV13 vaccine. An adult aged 58 years or older who has certain medical conditions and previously received 1 or more doses of PPSV23 vaccine should receive 1 dose of PCV13. The PCV13 vaccine dose should be obtained 1 or more years after the last PPSV23 vaccine dose.  Pneumococcal polysaccharide (PPSV23) vaccine. When PCV13 is also indicated, PCV13 should be obtained first. All adults aged 58 years and older should be immunized. An adult younger than age 65 years who has certain medical conditions should be immunized. Any person who resides in a nursing home or long-term care facility should be immunized. An adult smoker should be immunized. People with an immunocompromised condition and certain other conditions should receive both PCV13 and PPSV23 vaccines. People with human immunodeficiency virus (HIV) infection should be immunized as soon as possible after diagnosis. Immunization during chemotherapy or radiation therapy should be avoided. Routine use of PPSV23 vaccine is not recommended for American Indians, [place] Natives, or people younger than 65 years unless there are medical conditions that require PPSV23 vaccine. When indicated,  people who have unknown immunization and have no record of immunization should receive PPSV23 vaccine. One-time revaccination 5 years after the first dose of PPSV23 is recommended for people aged 70 64 years who have chronic kidney failure, nephrotic syndrome, asplenia, or immunocompromised conditions. People who received 1 2 doses of PPSV23 before age 32 years should receive another dose of PPSV23 vaccine at age 96 years or later if at least 5 years have passed since the previous dose. Doses of PPSV23 are not needed for people immunized with PPSV23 at or after age 55 years.  Meningococcal vaccine. Adults with asplenia or persistent complement component deficiencies should receive 2 doses of quadrivalent meningococcal conjugate (MenACWY-D) vaccine. The doses should be obtained at least 2 months apart. Microbiologists working with certain meningococcal bacteria, [employer] recruits, people at risk during an outbreak, and people who travel to or live in countries with a high rate of meningitis should be immunized. A first-year college student up through age 58 years who is living in a residence hall should receive a dose if she did not receive a dose on or after her 16th birthday. Adults who have certain high-risk conditions should receive one or more doses of vaccine.  Hepatitis A vaccine. Adults who wish to be protected from this disease, have certain high-risk conditions, work with hepatitis A-infected animals, work in hepatitis A research labs, or travel to or work in countries with a high rate of hepatitis A should be  immunized. Adults who were previously unvaccinated and who anticipate close contact with an international adoptee during the first 60 days after arrival in the [place] from a country with a high rate of hepatitis A should be immunized.  Hepatitis B vaccine.  Adults who wish to be protected from this disease, have certain high-risk conditions, may be exposed to blood or other infectious  body fluids, are household contacts or sex partners of hepatitis B positive people, are clients or workers in certain care facilities, or travel to or work in countries with a high rate of hepatitis B should be immunized.  Haemophilus influenzae type b (Hib) vaccine. A previously unvaccinated person with asplenia or sickle cell disease or having a scheduled splenectomy should receive 1 dose of Hib vaccine. Regardless of previous immunization, a recipient of a hematopoietic stem cell transplant should receive a 3-dose series 6 12 months after her successful transplant. Hib vaccine is not recommended for adults with HIV infection.  Preventive Services / Frequency Ages 6 to 39years  Blood pressure check.** / Every 1 to 2 years.  Lipid and cholesterol check.** / Every 5 years beginning at age 39.  Clinical breast exam.** / Every 3 years for women in their 61s and 62s.  BRCA-related cancer risk assessment.** / For women who have family members with a BRCA-related cancer (breast, ovarian, tubal, or peritoneal cancers).  Pap test.** / Every 2 years from ages 47 through 85. Every 3 years starting at age 34 through age 12 or 74 with a history of 3 consecutive normal Pap tests.  HPV screening.** / Every 3 years from ages 46 through ages 43 to 54 with a history of 3 consecutive normal Pap tests.  Hepatitis C blood test.** / For any individual with known risks for hepatitis C.  Skin self-exam. / Monthly.  Influenza vaccine. / Every year.  Tetanus, diphtheria, and acellular pertussis (Tdap, Td) vaccine.** / Consult your health care provider. Pregnant women should receive 1 dose of Tdap vaccine during each pregnancy. 1 dose of Td every 10 years.  Varicella vaccine.** / Consult your health care provider. Pregnant females who do not have evidence of immunity should receive the first dose after pregnancy.  HPV vaccine. / 3 doses over 6 months, if 64 and younger. The vaccine is not recommended for use in  pregnant females. However, pregnancy testing is not needed before receiving a dose.  Measles, mumps, rubella (MMR) vaccine.** / You need at least 1 dose of MMR if you were born in 1957 or later. You may also need a 2nd dose. For females of childbearing age, rubella immunity should be determined. If there is no evidence of immunity, females who are not pregnant should be vaccinated. If there is no evidence of immunity, females who are pregnant should delay immunization until after pregnancy.  Pneumococcal 13-valent conjugate (PCV13) vaccine.** / Consult your health care provider.  Pneumococcal polysaccharide (PPSV23) vaccine.** / 1 to 2 doses if you smoke cigarettes or if you have certain conditions.  Meningococcal vaccine.** / 1 dose if you are age 71 to 39 years and a Market researcher living in a residence hall, or have one of several medical conditions, you need to get vaccinated against meningococcal disease. You may also need additional booster doses.  Hepatitis A vaccine.** / Consult your health care provider.  Hepatitis B vaccine.** / Consult your health care provider.  Haemophilus influenzae type b (Hib) vaccine.** / Consult your health care provider.  Ages 55 to 64years  Blood pressure check.** / Every 1 to 2 years.  Lipid and cholesterol check.** / Every 5 years beginning at age 20 years.  Lung cancer screening. / Every year if you are aged 55 80 years and have a 30-pack-year history of smoking and currently smoke or have quit within the past 15 years. Yearly screening is stopped once you have quit smoking for at least 15 years or develop a health problem that would prevent you from having lung cancer treatment.  Clinical breast exam.** / Every year after age 40 years.  BRCA-related cancer risk assessment.** / For women who have family members with a BRCA-related cancer (breast, ovarian, tubal, or peritoneal cancers).  Mammogram.** / Every year beginning at age 40  years and continuing for as long as you are in good health. Consult with your health care provider.  Pap test.** / Every 3 years starting at age 30 years through age 65 or 70 years with a history of 3 consecutive normal Pap tests.  HPV screening.** / Every 3 years from ages 30 years through ages 65 to 70 years with a history of 3 consecutive normal Pap tests.  Fecal occult blood test (FOBT) of stool. / Every year beginning at age 50 years and continuing until age 75 years. You may not need to do this test if you get a colonoscopy every 10 years.  Flexible sigmoidoscopy or colonoscopy.** / Every 5 years for a flexible sigmoidoscopy or every 10 years for a colonoscopy beginning at age 50 years and continuing until age 75 years.  Hepatitis C blood test.** / For all people born from 1945 through 1965 and any individual with known risks for hepatitis C.  Skin self-exam. / Monthly.  Influenza vaccine. / Every year.  Tetanus, diphtheria, and acellular pertussis (Tdap/Td) vaccine.** / Consult your health care provider. Pregnant women should receive 1 dose of Tdap vaccine during each pregnancy. 1 dose of Td every 10 years.  Varicella vaccine.** / Consult your health care provider. Pregnant females who do not have evidence of immunity should receive the first dose after pregnancy.  Zoster vaccine.** / 1 dose for adults aged 60 years or older.  Measles, mumps, rubella (MMR) vaccine.** / You need at least 1 dose of MMR if you were born in 1957 or later. You may also need a 2nd dose. For females of childbearing age, rubella immunity should be determined. If there is no evidence of immunity, females who are not pregnant should be vaccinated. If there is no evidence of immunity, females who are pregnant should delay immunization until after pregnancy.  Pneumococcal 13-valent conjugate (PCV13) vaccine.** / Consult your health care provider.  Pneumococcal polysaccharide (PPSV23) vaccine.** / 1 to 2 doses if  you smoke cigarettes or if you have certain conditions.  Meningococcal vaccine.** / Consult your health care provider.  Hepatitis A vaccine.** / Consult your health care provider.  Hepatitis B vaccine.** / Consult your health care provider.  Haemophilus influenzae type b (Hib) vaccine.** / Consult your health care provider.  Ages 65 years and over  Blood pressure check.** / Every 1 to 2 years.  Lipid and cholesterol check.** / Every 5 years beginning at age 20 years.  Lung cancer screening. / Every year if you are aged 55 80 years and have a 30-pack-year history of smoking and currently smoke or have quit within the past 15 years. Yearly screening is stopped once you have quit smoking for at least 15 years or develop a health problem that   would prevent you from having lung cancer treatment.  Clinical breast exam.** / Every year after age 103 years.  BRCA-related cancer risk assessment.** / For women who have family members with a BRCA-related cancer (breast, ovarian, tubal, or peritoneal cancers).  Mammogram.** / Every year beginning at age 36 years and continuing for as long as you are in good health. Consult with your health care provider.  Pap test.** / Every 3 years starting at age 5 years through age 85 or 10 years with 3 consecutive normal Pap tests. Testing can be stopped between 65 and 70 years with 3 consecutive normal Pap tests and no abnormal Pap or HPV tests in the past 10 years.  HPV screening.** / Every 3 years from ages 93 years through ages 70 or 45 years with a history of 3 consecutive normal Pap tests. Testing can be stopped between 65 and 70 years with 3 consecutive normal Pap tests and no abnormal Pap or HPV tests in the past 10 years.  Fecal occult blood test (FOBT) of stool. / Every year beginning at age 8 years and continuing until age 45 years. You may not need to do this test if you get a colonoscopy every 10 years.  Flexible sigmoidoscopy or colonoscopy.** /  Every 5 years for a flexible sigmoidoscopy or every 10 years for a colonoscopy beginning at age 69 years and continuing until age 68 years.  Hepatitis C blood test.** / For all people born from [birth year] through 1963 and any individual with known risks for hepatitis C.  Osteoporosis screening.** / A one-time screening for women ages 7 years and over and women at risk for fractures or osteoporosis.  Skin self-exam. / Monthly.  Influenza vaccine. / Every year.  Tetanus, diphtheria, and acellular pertussis (Tdap/Td) vaccine.** / 1 dose of Td every 10 years.  Varicella vaccine.** / Consult your health care provider.  Zoster vaccine.** / 1 dose for adults aged 5 years or older.  Pneumococcal 13-valent conjugate (PCV13) vaccine.** / Consult your health care provider.  Pneumococcal polysaccharide (PPSV23) vaccine.** / 1 dose for all adults aged 74 years and older.  Meningococcal vaccine.** / Consult your health care provider.  Hepatitis A vaccine.** / Consult your health care provider.  Hepatitis B vaccine.** / Consult your health care provider.  Haemophilus influenzae type b (Hib) vaccine.** / Consult your health care provider. ** Family history and personal history of risk and conditions may change your health care provider's recommendations. Document Released: 09/22/2001 Document Revised: 05/17/2013  [facility] Patient Information 2014 [place], [place].   EXERCISE AND DIET:  We recommended that you start or continue a regular exercise program for good health. Regular exercise means any activity that makes your heart beat faster and makes you sweat.  We recommend exercising at least 30 minutes per day at least 3 days a week, preferably 5.  We also recommend a diet low in fat and sugar / carbohydrates.  Inactivity, poor dietary choices and obesity can cause diabetes, heart attack, stroke, and kidney damage, among others.     ALCOHOL AND SMOKING:  Women should limit their alcohol intake to no  more than 7 drinks/beers/glasses of wine (combined, not each!) per week. Moderation of alcohol intake to this level decreases your risk of breast cancer and liver damage.  ( And of course, no recreational drugs are part of a healthy lifestyle.)  Also, you should not be smoking at all or even being exposed to second hand smoke. Most people know smoking can  cause cancer, and various heart and lung diseases, but did you know it also contributes to weakening of your bones?  Aging of your skin?  Yellowing of your teeth and nails?   CALCIUM AND VITAMIN D:  Adequate intake of calcium and Vitamin D are recommended.  The recommendations for exact amounts of these supplements seem to change often, but generally speaking 600 mg of calcium (either carbonate or citrate) and 800 units of Vitamin D per day seems prudent. Certain women may benefit from higher intake of Vitamin D.  If you are among these women, your doctor will have told you during your visit.     PAP SMEARS:  Pap smears, to check for cervical cancer or precancers,  have traditionally been done yearly, although recent scientific advances have shown that most women can have pap smears less often.  However, every woman still should have a physical exam from her gynecologist or primary care physician every year. It will include a breast check, inspection of the vulva and vagina to check for abnormal growths or skin changes, a visual exam of the cervix, and then an exam to evaluate the size and shape of the uterus and ovaries.  And after 52 years of age, a rectal exam is indicated to check for rectal cancers. We will also provide age appropriate advice regarding health maintenance, like when you should have certain vaccines, screening for sexually transmitted diseases, bone density testing, colonoscopy, mammograms, etc.    MAMMOGRAMS:  All women over 71 years old should have a yearly mammogram. Many facilities now offer a "3D" mammogram, which may cost  around $50 extra out of pocket. If possible,  we recommend you accept the option to have the 3D mammogram performed.  It both reduces the number of women who will be called back for extra views which then turn out to be normal, and it is better than the routine mammogram at detecting truly abnormal areas.     COLONOSCOPY:  Colonoscopy to screen for colon cancer is recommended for all women at age 52.  We know, you hate the idea of the prep.  We agree, BUT, having colon cancer and not knowing it is worse!!  Colon cancer so often starts as a polyp that can be seen and removed at colonscopy, which can quite literally save your life!  And if your first colonoscopy is normal and you have no family history of colon cancer, most women don't have to have it again for 10 years.  Once every ten years, you can do something that may end up saving your life, right?  We will be happy to help you get it scheduled when you are ready.  Be sure to check your insurance coverage so you understand how much it will cost.  It may be covered as a preventative service at no cost, but you should check your particular policy.

## 2019-12-13 NOTE — Progress Notes (Signed)
Female Physical   Impression and Recommendations:    1. Healthcare maintenance   2. Encounter for screening mammogram for malignant neoplasm of breast   3. Women's annual routine gynecological examination   4. Screening for colon cancer   5. Need for Tdap vaccination   6. Need for shingles vaccine   7. Seasonal allergies   8. Mild intermittent reactive airway disease without complication   9. Mild intermittent asthma without complication     1) Anticipatory Guidance: Discussed skin CA prevention and sunscreen when outside along with skin surveillance; eating a balanced and modest diet; physical activity at least 25 minutes per day or minimum of 150 min/ week moderate to intense activity. - Pt has left sided LE neuropathy, L hip pain and muscle weakness which have affected her activity level and has resulted in 2 falls this year. She is followed by several specialists including orthopedics, chiropractor and will be having a neurology consult soon.   2) Immunizations / Screenings / Labs:   All immunizations are up-to-date per recommendations or will be updated today if pt allows.    - Patient understands with dental and vision screens they will schedule independently.  - Will obtain CBC, CMP, HgA1c, Lipid panel, TSH and vit D when fasting, if not already done past 12 mo.  - Zoster vaccine ordered. - Pt plans to obtain Tdap vaccine at work. - Pt declined HIV screening. - Hep C screening UTD. - Placed referral for gastroenterology for screening colonoscopy and Ob-Gyn for gynecological exam. - Placed order for screening mammogram.   3) Weight:  BMI meaning discussed with patient.  Discussed goal to improve diet habits to improve overall feelings of well being and objective health data. Improve nutrient density of diet through increasing intake of fruits and vegetables and decreasing saturated fats, white flour products and refined sugars. - Pt's chronic medical conditions limit how  active she can be so encourage to follow mediterranean diet.   4) Healthcare Maintenance  - Provided medication refills. - Continue current medication regimen. - Stay hydrated, at least 64 fl ounces  - Continue follow-ups with specialists as directed. - Follow up in 6 months for hyperlipidemia and lipid recheck   Meds ordered this encounter  Medications  . oxybutynin (DITROPAN-XL) 10 MG 24 hr tablet    Sig: Take 1 tablet (10 mg total) by mouth at bedtime.    Dispense:  90 tablet    Refill:  1  . montelukast (SINGULAIR) 10 MG tablet    Sig: Take 1 tablet (10 mg total) by mouth at bedtime.    Dispense:  90 tablet    Refill:  1  . ADVAIR HFA 45-21 MCG/ACT inhaler    Sig: Inhale 2 puffs into the lungs 2 (two) times daily.    Dispense:  1 Inhaler    Refill:  2    Order Specific Question:   Supervising Provider    Answer:   Beatrice Lecher D [[id number]]  . albuterol (PROAIR HFA) 108 (90 Base) MCG/ACT inhaler    Sig: Inhale 2 puffs into the lungs every 4 (four) hours as needed for wheezing or shortness of breath.    Dispense:  18 g    Refill:  2    Order Specific Question:   Supervising Provider    Answer:   Beatrice Lecher D [[id number]]    Orders Placed This Encounter  Procedures  . MM Digital Screening  . Varicella-zoster vaccine IM (Shingrix)  . Ambulatory referral to  Obstetrics / Gynecology  . Ambulatory referral to Gastroenterology     Return for HLD and recheck lipid panel in 6 months.     Gross side effects, risk and benefits, and alternatives of medications discussed with patient.  Patient is aware that all medications have potential side effects and we are unable to predict every side effect or drug-drug interaction that may occur.  Expresses verbal understanding and consents to current therapy plan and treatment regimen.  F-up preventative CPE in 1 year, this is in addition to any chronic care visits.     Please see orders placed and AVS handed out to patient at  the end of our visit for further patient instructions/ counseling done pertaining to today's office visit.   Subjective:     CPE HPI: Denise Gamble is a 54 y.o. female who presents to [place] [facility] today for a yearly health maintenance exam.   Health Maintenance Summary  - Reviewed and updated, unless pt declines services.  Last Cologuard or Colonoscopy:   Referral placed.  Family history of Colon CA:  Yes, father at age 54 Tobacco History Reviewed:  Y, never smoker Alcohol and/or drug use:  No concerns; no use Exercise Habits: Limited due to neuropathy & myalgias Dental Home: Y Eye exams: Y Dermatology home: Y  Female Health:  PAP Smear - last known results:  12/2016, negative. Placed referral to Ob-Gyn STD concerns: none Birth control method:  n/a Menses regular:  n/a Lumps or breast concerns: none Breast Cancer Family History: No Bone/ DEXA scan:  Unnecessary due to < 65 and average risk   Additional concerns beyond health maintenance issues:     Immunization History  Administered Date(s) Administered  . Influenza-Unspecified 04/08/2017, 05/26/2018  . Janssen (J&J) SARS-COV-2 Vaccination 11/18/2019  . Pneumococcal Polysaccharide-23 12/14/2016  . Zoster Recombinat (Shingrix) 12/13/2019     Health Maintenance  Topic Date Due  . TETANUS/TDAP  Never done  . COLONOSCOPY  Never done  . PAP SMEAR-Modifier  12/15/2019  . INFLUENZA VACCINE  03/10/2020  . MAMMOGRAM  09/15/2020  . COVID-19 Vaccine  Completed  . HIV Screening  Completed     Wt Readings from Last 3 Encounters:  12/13/19 221 lb 4.8 oz (100.4 kg)  10/24/19 225 lb 4.8 oz (102.2 kg)  03/15/19 215 lb (97.5 kg)   BP Readings from Last 3 Encounters:  12/13/19 125/79  10/24/19 118/80  03/15/19 108/68   Pulse Readings from Last 3 Encounters:  12/13/19 69  10/24/19 70  03/15/19 64     Past Medical History:  Diagnosis Date  . Allergy   . Asthma   . GERD  (gastroesophageal reflux disease)   . Miscarriage 2013   multiple   . Thoracic outlet syndrome       Past Surgical History:  Procedure Laterality Date  . LYMPHADENECTOMY        Family History  Problem Relation Age of Onset  . Heart disease Mother 56       epstein anolmay   . Colon cancer Father 3  . Skin cancer Father        ? thinks melanoma?   . Leukemia Father        AAL  . Hyperlipidemia Father   . Heart disease Brother   . Alcohol abuse Brother   . Diabetes Brother   . Hypertension Brother   . Coronary artery disease Sister   . Hyperlipidemia Sister   . Hypertension Sister   .  Heart attack Maternal Grandfather   . Breast cancer Neg Hx       Social History   Substance and Sexual Activity  Drug Use No  ,   Social History   Substance and Sexual Activity  Alcohol Use No  . Alcohol/week: 0.0 standard drinks  ,   Social History   Tobacco Use  Smoking Status Never Smoker  Smokeless Tobacco Never Used  ,   Social History   Substance and Sexual Activity  Sexual Activity Yes   Comment: perimenopausal    Current Outpatient Medications on File Prior to Visit  Medication Sig Dispense Refill  . cetirizine (ZYRTEC) 10 MG tablet Take 10 mg by mouth.    Marland Kitchen ibuprofen (ADVIL,MOTRIN) 200 MG tablet Take 200 mg by mouth every 6 (six) hours as needed.    Marland Kitchen omeprazole (PRILOSEC) 20 MG capsule Take 20 mg by mouth daily.     Current Facility-Administered Medications on File Prior to Visit  Medication Dose Route Frequency Provider Last Rate Last Admin  . betamethasone acetate-betamethasone sodium phosphate (CELESTONE) injection 3 mg  3 mg Intramuscular Once Edrick Kins, DPM        Allergies: Pollen extract and Latex  Review of Systems: General: Denies fever, chills, unexplained weight loss.  Optho/Auditory: Denies visual changes, blurred vision/LOV Respiratory: Denies SOB, DOE more than baseline levels.   Cardiovascular: Denies chest pain, palpitations,  new onset peripheral edema  Gastrointestinal: Denies nausea, vomiting, diarrhea.  Genitourinary: Denies dysuria, freq/ urgency, flank pain or discharge from genitals.  Endocrine: Denies hot or cold intolerance, polyuria, polydipsia. Musculoskeletal: Denies joint swelling, unexplained arthralgias Skin:  Denies rash, suspicious lesions Neurological: Denies dizziness, memory loss, confusion Psychiatric/Behavioral: Denies mood changes, suicidal or homicidal ideations, hallucinations    Objective:    Blood pressure 125/79, pulse 69, temperature 98.4 F (36.9 C), temperature source Oral, height 5' 3"  (1.6 m), weight 221 lb 4.8 oz (100.4 kg), last menstrual period 03/28/2018, SpO2 99 %. Body mass index is 39.2 kg/m. General Appearance:    Alert, cooperative, no distress, appears stated age  Head:    Normocephalic, without obvious abnormality, atraumatic  Eyes:    PERRL, conjunctiva/corneas clear, EOM's intact, fundi    benign, both eyes  Ears:    Middle ear effusion noted of both ears, Non-erythematous TM's and normal external ear canals, both ears  Nose:   Nares normal, septum midline, mucosa normal, no drainage    or sinus tenderness  Throat:   Lips w/o lesion, mucosa moist, and tongue normal; teeth and   gums normal  Neck:   Supple, symmetrical, trachea midline, no adenopathy;    thyroid:  no enlargement/tenderness/nodules; no carotid   bruit or JVD  Back:     Symmetric, no curvature, ROM normal, no CVA tenderness  Lungs:     Rhonchi on inspiration of upper lobes noted, no wheezing, crackles or rales present, respirations unlabored,  Chest Wall:    No tenderness or gross deformity; normal excursion   Heart:    Regular rate and rhythm, S1 and S2 normal, no murmur, rub   or gallop  Breast Exam:    No tenderness, masses, or nipple abnormality b/l; no d/c  Abdomen:     Soft, non-tender, bowel sounds active all four quadrants, No G/R/R, no masses, no organomegaly  Genitalia:    Deferred.   Rectal:    Deferred.  Extremities:   Extremities normal, atraumatic, no cyanosis or gross edema  Pulses:   2+ and symmetric  all extremities  Skin:   Warm, dry, Skin color, texture, turgor normal, no obvious rashes or lesions Psych: No HI/SI, judgement and insight good, Euthymic mood. Full Affect.  Neurologic:   CNII-XII grossly intact, normal strength, sensation grossly intact

## 2019-12-29 ENCOUNTER — Other Ambulatory Visit: Payer: Self-pay

## 2020-01-01 ENCOUNTER — Encounter: Payer: Self-pay | Admitting: Obstetrics and Gynecology

## 2020-01-01 ENCOUNTER — Other Ambulatory Visit: Payer: Self-pay

## 2020-01-01 ENCOUNTER — Ambulatory Visit (INDEPENDENT_AMBULATORY_CARE_PROVIDER_SITE_OTHER): Payer: Managed Care, Other (non HMO) | Admitting: Obstetrics and Gynecology

## 2020-01-01 VITALS — BP 118/70 | HR 76 | Temp 97.6°F | Resp 14 | Ht 63.0 in | Wt 220.8 lb

## 2020-01-01 DIAGNOSIS — N3946 Mixed incontinence: Secondary | ICD-10-CM

## 2020-01-01 DIAGNOSIS — Z23 Encounter for immunization: Secondary | ICD-10-CM | POA: Diagnosis not present

## 2020-01-01 DIAGNOSIS — Z01419 Encounter for gynecological examination (general) (routine) without abnormal findings: Secondary | ICD-10-CM

## 2020-01-01 NOTE — Patient Instructions (Signed)
EXERCISE AND DIET:  We recommended that you start or continue a regular exercise program for good health. Regular exercise means any activity that makes your heart beat faster and makes you sweat.  We recommend exercising at least 30 minutes per day at least 3 days a week, preferably 4 or 5.  We also recommend a diet low in fat and sugar.  Inactivity, poor dietary choices and obesity can cause diabetes, heart attack, stroke, and kidney damage, among others.    ALCOHOL AND SMOKING:  Women should limit their alcohol intake to no more than 7 drinks/beers/glasses of wine (combined, not each!) per week. Moderation of alcohol intake to this level decreases your risk of breast cancer and liver damage. And of course, no recreational drugs are part of a healthy lifestyle.  And absolutely no smoking or even second hand smoke. Most people know smoking can cause heart and lung diseases, but did you know it also contributes to weakening of your bones? Aging of your skin?  Yellowing of your teeth and nails?  CALCIUM AND VITAMIN D:  Adequate intake of calcium and Vitamin D are recommended.  The recommendations for exact amounts of these supplements seem to change often, but generally speaking 600 mg of calcium (either carbonate or citrate) and 800 units of Vitamin D per day seems prudent. Certain women may benefit from higher intake of Vitamin D.  If you are among these women, your doctor will have told you during your visit.    PAP SMEARS:  Pap smears, to check for cervical cancer or precancers,  have traditionally been done yearly, although recent scientific advances have shown that most women can have pap smears less often.  However, every woman still should have a physical exam from her gynecologist every year. It will include a breast check, inspection of the vulva and vagina to check for abnormal growths or skin changes, a visual exam of the cervix, and then an exam to evaluate the size and shape of the uterus and  ovaries.  And after 52 years of age, a rectal exam is indicated to check for rectal cancers. We will also provide age appropriate advice regarding health maintenance, like when you should have certain vaccines, screening for sexually transmitted diseases, bone density testing, colonoscopy, mammograms, etc.   MAMMOGRAMS:  All women over 56 years old should have a yearly mammogram. Many facilities now offer a "3D" mammogram, which may cost around $50 extra out of pocket. If possible,  we recommend you accept the option to have the 3D mammogram performed.  It both reduces the number of women who will be called back for extra views which then turn out to be normal, and it is better than the routine mammogram at detecting truly abnormal areas.    COLONOSCOPY:  Colonoscopy to screen for colon cancer is recommended for all women at age 4.  We know, you hate the idea of the prep.  We agree, BUT, having colon cancer and not knowing it is worse!!  Colon cancer so often starts as a polyp that can be seen and removed at colonscopy, which can quite literally save your life!  And if your first colonoscopy is normal and you have no family history of colon cancer, most women don't have to have it again for 10 years.  Once every ten years, you can do something that may end up saving your life, right?  We will be happy to help you get it scheduled when you are ready.  Be sure to check your insurance coverage so you understand how much it will cost.  It may be covered as a preventative service at no cost, but you should check your particular policy.      Atrophic Vaginitis  Atrophic vaginitis is a condition in which the tissues that line the vagina become dry and thin. This condition is most common in women who have stopped having regular menstrual periods (are in menopause). This usually starts when a woman is 66-49 years old. That is the time when a woman's estrogen levels begin to drop (decrease). Estrogen is a female  hormone. It helps to keep the tissues of the vagina moist. It stimulates the vagina to produce a clear fluid that lubricates the vagina for sexual intercourse. This fluid also protects the vagina from infection. Lack of estrogen can cause the lining of the vagina to get thinner and dryer. The vagina may also shrink in size. It may become less elastic. Atrophic vaginitis tends to get worse over time as a woman's estrogen level drops. What are the causes? This condition is caused by the normal drop in estrogen that happens around the time of menopause. What increases the risk? Certain conditions or situations may lower a woman's estrogen level, leading to a higher risk for atrophic vaginitis. You are more likely to develop this condition if:  You are taking medicines that block estrogen.  You have had your ovaries removed.  You are being treated for cancer with X-ray (radiation) or medicines (chemotherapy).  You have given birth or are breastfeeding.  You are older than age 91.  You smoke. What are the signs or symptoms? Symptoms of this condition include:  Pain, soreness, or bleeding during sexual intercourse (dyspareunia).  Vaginal burning, irritation, or itching.  Pain or bleeding when a speculum is used in a vaginal exam (pelvic exam).  Having burning pain when passing urine.  Vaginal discharge that is brown or yellow. In some cases, there are no symptoms. How is this diagnosed? This condition is diagnosed by taking a medical history and doing a physical exam. This will include a pelvic exam that checks the vaginal tissues. Though rare, you may also have other tests, including:  A urine test.  A test that checks the acid balance in your vagina (acid balance test). How is this treated? Treatment for this condition depends on how severe your symptoms are. Treatment may include:  Using an over-the-counter vaginal lubricant before sex.  Using a long-acting vaginal  moisturizer.  Using low-dose vaginal estrogen for moderate to severe symptoms that do not respond to other treatments. Options include creams, tablets, and inserts (vaginal rings). Before you use a vaginal estrogen, tell your health care provider if you have a history of: ? Breast cancer. ? Endometrial cancer. ? Blood clots. If you are not sexually active and your symptoms are very mild, you may not need treatment. Follow these instructions at home: Medicines  Take over-the-counter and prescription medicines only as told by your health care provider. Do not use herbal or alternative medicines unless your health care provider says that you can.  Use over-the-counter creams, lubricants, or moisturizers for dryness only as directed by your health care provider. General instructions  If your atrophic vaginitis is caused by menopause, discuss all of your menopause symptoms and treatment options with your health care provider.  Do not douche.  Do not use products that can make your vagina dry. These include: ? Scented feminine sprays. ? Scented tampons. ?  Scented soaps.  Vaginal intercourse can help to improve blood flow and elasticity of vaginal tissue. If it hurts to have sex, try using a lubricant or moisturizer just before having intercourse. Contact a health care provider if:  Your discharge looks different than normal.  Your vagina has an unusual smell.  You have new symptoms.  Your symptoms do not improve with treatment.  Your symptoms get worse. Summary  Atrophic vaginitis is a condition in which the tissues that line the vagina become dry and thin. It is most common in women who have stopped having regular menstrual periods (are in menopause).  Treatment options include using vaginal lubricants and low-dose vaginal estrogen.  Contact a health care provider if your vagina has an unusual smell, or if your symptoms get worse or do not improve after treatment. This  information is not intended to replace advice given to you by your health care provider. Make sure you discuss any questions you have with your health care provider. Document Revised: 07/09/2017 Document Reviewed: 04/22/2017 Elsevier Patient Education  2020 [employer].

## 2020-01-01 NOTE — Progress Notes (Signed)
54 y.o. G40P0040 [name] here for annual exam.    Had one menses last year.   Patient complaining of vaginal dryness.  She has some incontinence, since age 89.  She takes Ditropan XL. She has to map out bathrooms.  She has leakage of urine with cough, laugh, or sneeze.  Moved from [place] one year ago.  Works for [employer], trained in Product/process development scientist.   Covid vaccine on 4/1/121.  PCP:   Dr. Mariel Kansky  Patient's last menstrual period was 03/29/2019.           Sexually active: Yes.    The current method of family planning is none.    Exercising: Yes.    walking Smoker:  no  Health Maintenance: Pap: 12-14-16 Neg:Neg HR HPV.   History of abnormal Pap:  Thinks she had HPV on a pap in 2007.  All follow up was normal, and she did regular paps.  MMG:09-15-18 3D/Neg/density B/BiRads1 Colonoscopy: NEVER.   BMD: n/a  Result  n/a TDaP: over 10 years ago--WOULD LIKE TODAY--HAD Shingrix 12/13/19 Gardasil:   no HIV: Neg in Preg Hep C: Unsure Screening Labs:  PCP.    reports that she has never smoked. She has never used smokeless tobacco. She reports that she does not drink alcohol or use drugs.  Past Medical History:  Diagnosis Date  . Allergy   . Asthma   . GERD (gastroesophageal reflux disease)   . Miscarriage 2013   multiple   . Thoracic outlet syndrome   . Urinary incontinence     Past Surgical History:  Procedure Laterality Date  . DILATION AND CURETTAGE OF UTERUS    . LYMPHADENECTOMY      Current Outpatient Medications  Medication Sig Dispense Refill  . ADVAIR HFA 45-21 MCG/ACT inhaler Inhale 2 puffs into the lungs 2 (two) times daily. 1 Inhaler 2  . albuterol (PROAIR HFA) 108 (90 Base) MCG/ACT inhaler Inhale 2 puffs into the lungs every 4 (four) hours as needed for wheezing or shortness of breath. 18 g 2  . cetirizine (ZYRTEC) 10 MG tablet Take 10 mg by mouth.    Marland Kitchen ibuprofen (ADVIL,MOTRIN) 200 MG tablet Take 200 mg by mouth every 6 (six) hours  as needed.    . montelukast (SINGULAIR) 10 MG tablet Take 1 tablet (10 mg total) by mouth at bedtime. 90 tablet 1  . omeprazole (PRILOSEC) 20 MG capsule Take 20 mg by mouth daily.    Marland Kitchen oxybutynin (DITROPAN-XL) 10 MG 24 hr tablet Take 1 tablet (10 mg total) by mouth at bedtime. 90 tablet 1   No current facility-administered medications for this visit.    Family History  Problem Relation Age of Onset  . Heart disease Mother 60       epstein anolmay   . Colon cancer Father 9  . Skin cancer Father        ? thinks melanoma?   . Leukemia Father        AAL  . Hyperlipidemia Father   . Hypertension Father   . Heart disease Brother   . Alcohol abuse Brother   . Diabetes Brother   . Hypertension Brother   . Coronary artery disease Sister   . Hyperlipidemia Sister   . Hypertension Sister   . Stroke Sister   . Cancer Sister        Lung Ca w/Mets to brain  . Heart attack Maternal Grandfather   . Hypertension Sister   . Breast cancer Neg  Hx     Review of Systems  All other systems reviewed and are negative.   Exam:   BP 118/70 (Cuff Size: Large)   Pulse 76   Temp 97.6 F (36.4 C) (Temporal)   Resp 14   Ht 5' 3"  (1.6 m)   Wt 220 lb 12.8 oz (100.2 kg)   LMP 03/29/2019   BMI 39.11 kg/m     General appearance: alert, cooperative and appears stated age Head: normocephalic, without obvious abnormality, atraumatic Neck: no adenopathy, supple, symmetrical, trachea midline and thyroid normal to inspection and palpation Lungs: clear to auscultation bilaterally Breasts: normal appearance, no masses or tenderness, No nipple retraction or dimpling, No nipple discharge or bleeding, No axillary adenopathy Heart: regular rate and rhythm Abdomen: soft, non-tender; no masses, no organomegaly Extremities: extremities normal, atraumatic, no cyanosis or edema Skin: skin color, texture, turgor normal. No rashes or lesions Lymph nodes: cervical, supraclavicular, and axillary nodes  normal. Neurologic: grossly normal  Pelvic: External genitalia:  no lesions              No abnormal inguinal nodes palpated.              Urethra:  normal appearing urethra with no masses, tenderness or lesions              Bartholins and Skenes: normal                 Vagina: normal appearing vagina with normal color and discharge, no lesions              Cervix: no lesions              Pap taken: No. Bimanual Exam:  Uterus:  normal size, contour, position, consistency, mobility, non-tender              Adnexa: no mass, fullness, tenderness              Rectal exam: Yes.  .  Confirms.              Anus:  normal sphincter tone, no lesions  Chaperone was present for exam.  Assessment:   Well woman visit with normal exam. Possible remote history of abnormal pap.  Mixed incontinence.   Plan: Mammogram screening discussed. Self breast awareness reviewed. Pap and HR HPV as above. Guidelines for Calcium, Vitamin D, regular exercise program including cardiovascular and weight bearing exercise. ACOG HO on urinary incontinence.  Referral for pelvic floor PT.  We discussed treatment for vaginal atrophy.    TDap.  Follow up annually and prn.    After visit summary provided.

## 2020-01-04 ENCOUNTER — Encounter: Payer: Self-pay | Admitting: *Deleted

## 2020-01-09 ENCOUNTER — Ambulatory Visit (INDEPENDENT_AMBULATORY_CARE_PROVIDER_SITE_OTHER): Payer: Managed Care, Other (non HMO) | Admitting: Diagnostic Neuroimaging

## 2020-01-09 ENCOUNTER — Encounter: Payer: Self-pay | Admitting: Diagnostic Neuroimaging

## 2020-01-09 ENCOUNTER — Other Ambulatory Visit: Payer: Self-pay

## 2020-01-09 VITALS — BP 113/76 | HR 78 | Ht 63.0 in | Wt 222.2 lb

## 2020-01-09 DIAGNOSIS — M79605 Pain in left leg: Secondary | ICD-10-CM | POA: Diagnosis not present

## 2020-01-09 DIAGNOSIS — R200 Anesthesia of skin: Secondary | ICD-10-CM | POA: Diagnosis not present

## 2020-01-09 DIAGNOSIS — M79604 Pain in right leg: Secondary | ICD-10-CM

## 2020-01-09 DIAGNOSIS — M25559 Pain in unspecified hip: Secondary | ICD-10-CM

## 2020-01-09 NOTE — Patient Instructions (Signed)
INTERMITTENT, POSITIONAL NERVE PAIN (left > right; hip, thigh, knee, legs) - refer to PT evaluation

## 2020-01-09 NOTE — Progress Notes (Signed)
[facility]  PATIENT: Denise Gamble DOB: March 09, 1966  REFERRING CLINICIAN: [name], PA-C HISTORY FROM: patient  REASON FOR VISIT: new consult    HISTORICAL  CHIEF COMPLAINT:  Chief Complaint  Patient presents with  . Paresthesia lower extremity    rm 7 New Pt "since Oct 2020- bilateral burnng in both legs w/compression; left leg numbness/ weakness when standing, sitting, walking, bed"    HISTORY OF PRESENT ILLNESS:   54 year old female here for evaluation of burning sensation lower extremities.  Patient describes burning sensation in hips, thighs, knees and shins, mainly on the left side.  Symptoms worse with activity, bending, getting in and out of a car, rolling over in bed, sleeping or stretching.  Symptoms worsen throughout the day and with activity.  Patient had onset of problems in 2018 with left plantar fasciitis.  Symptoms are progressively worsened since that time to include leg pain, back pain and hip pain.  She tried chiropractic treatments without relief.   REVIEW OF SYSTEMS: Full 14 system review of systems performed and negative with exception of: As per HPI.  ALLERGIES: Allergies  Allergen Reactions  . Pollen Extract Hives  . Latex Hives    HOME MEDICATIONS: Outpatient Medications Prior to Visit  Medication Sig Dispense Refill  . ADVAIR HFA 45-21 MCG/ACT inhaler Inhale 2 puffs into the lungs 2 (two) times daily. 1 Inhaler 2  . albuterol (PROAIR HFA) 108 (90 Base) MCG/ACT inhaler Inhale 2 puffs into the lungs every 4 (four) hours as needed for wheezing or shortness of breath. 18 g 2  . cetirizine (ZYRTEC) 10 MG tablet Take 10 mg by mouth.    Marland Kitchen ibuprofen (ADVIL,MOTRIN) 200 MG tablet Take 200 mg by mouth every 6 (six) hours as needed.    . montelukast (SINGULAIR) 10 MG tablet Take 1 tablet (10 mg total) by mouth at bedtime. 90 tablet 1  . omeprazole (PRILOSEC) 20 MG capsule Take 20 mg by mouth daily.    Marland Kitchen oxybutynin  (DITROPAN-XL) 10 MG 24 hr tablet Take 1 tablet (10 mg total) by mouth at bedtime. 90 tablet 1   No facility-administered medications prior to visit.    PAST MEDICAL HISTORY: Past Medical History:  Diagnosis Date  . Allergy   . Asthma   . GERD (gastroesophageal reflux disease)   . History of MRSA infection   . Miscarriage 2013   multiple   . Paresthesia of lower extremity   . Peripheral neuropathy 2019   left foot  . Plantar fasciitis of left foot   . Thoracic outlet syndrome   . Urinary incontinence     PAST SURGICAL HISTORY: Past Surgical History:  Procedure Laterality Date  . DILATION AND CURETTAGE OF UTERUS    . LYMPHADENECTOMY  1979    FAMILY HISTORY: Family History  Problem Relation Age of Onset  . Heart disease Mother 58       epstein anolmay   . Dementia Mother   . Colon cancer Father 2  . Skin cancer Father        ? thinks melanoma?   . Leukemia Father        AAL  . Hyperlipidemia Father   . Hypertension Father   . Heart disease Brother   . Alcohol abuse Brother   . Diabetes Brother   . Hypertension Brother   . Prostate cancer Brother   . Coronary artery disease Brother   . Hyperlipidemia Sister   . Hypertension Sister   . Stroke  Sister   . Cancer Sister        Lung Ca w/Mets to brain  . Heart attack Maternal Grandfather   . Hypertension Sister   . Alzheimer's disease Maternal Aunt   . Breast cancer Neg Hx     SOCIAL HISTORY: Social History   Socioeconomic History  . Marital status: Married    Spouse name: Eddie Dibbles  . Number of children: 0  . Years of education: Not on file  . Highest education level: Master's degree (e.g., MA, MS, MEng, MEd, MSW, MBA)  Occupational History  . Not on file  Tobacco Use  . Smoking status: Never Smoker  . Smokeless tobacco: Never Used  Substance and Sexual Activity  . Alcohol use: No    Alcohol/week: 0.0 standard drinks    Comment: quit 2010  . Drug use: No  . Sexual activity: Yes    Comment:  perimenopausal  Other Topics Concern  . Not on file  Social History Narrative   Works for [employer] to [employer] health   Married   No caffeine            Social Determinants of Radio broadcast assistant Strain:   . Difficulty of Paying Living Expenses:   Food Insecurity:   . Worried About Charity fundraiser in the Last Year:   . Arboriculturist in the Last Year:   Transportation Needs:   . Film/video editor (Medical):   Marland Kitchen Lack of Transportation (Non-Medical):   Physical Activity:   . Days of Exercise per Week:   . Minutes of Exercise per Session:   Stress:   . Feeling of Stress :   Social Connections:   . Frequency of Communication with Friends and Family:   . Frequency of Social Gatherings with Friends and Family:   . Attends Religious Services:   . Active Member of Clubs or Organizations:   . Attends Archivist Meetings:   Marland Kitchen Marital Status:   Intimate Partner Violence:   . Fear of Current or Ex-Partner:   . Emotionally Abused:   Marland Kitchen Physically Abused:   . Sexually Abused:      PHYSICAL EXAM  GENERAL EXAM/CONSTITUTIONAL: Vitals:  Vitals:   01/09/20 0817  BP: 113/76  Pulse: 78  Weight: 222 lb 3.2 oz (100.8 kg)  Height: 5' 3"  (1.6 m)     Body mass index is 39.36 kg/m. Wt Readings from Last 3 Encounters:  01/09/20 222 lb 3.2 oz (100.8 kg)  01/01/20 220 lb 12.8 oz (100.2 kg)  12/13/19 221 lb 4.8 oz (100.4 kg)     Patient is in no distress; well developed, nourished and groomed; neck is supple  CARDIOVASCULAR:  Examination of carotid arteries is normal; no carotid bruits  Regular rate and rhythm, no murmurs  Examination of peripheral vascular system by observation and palpation is normal  EYES:  Ophthalmoscopic exam of optic discs and posterior segments is normal; no papilledema or hemorrhages  No exam data present  MUSCULOSKELETAL:  Gait, strength, tone, movements noted in Neurologic exam  below  NEUROLOGIC: MENTAL STATUS:  No flowsheet data found.  awake, alert, oriented to person, place and time  recent and remote memory intact  normal attention and concentration  language fluent, comprehension intact, naming intact  fund of knowledge appropriate  CRANIAL NERVE:   2nd - no papilledema on fundoscopic exam  2nd, 3rd, 4th, 6th - pupils equal and reactive to light, visual fields  full to confrontation, extraocular muscles intact, no nystagmus  5th - facial sensation symmetric  7th - facial strength symmetric  8th - hearing intact  9th - palate elevates symmetrically, uvula midline  11th - shoulder shrug symmetric  12th - tongue protrusion midline  MOTOR:   normal bulk and tone, full strength in the BUE, BLE  SENSORY:   normal and symmetric to light touch, temperature, vibration  COORDINATION:   finger-nose-finger, fine finger movements normal  REFLEXES:   deep tendon reflexes present and symmetric  GAIT/STATION:   narrow based gait;  romberg is negative     DIAGNOSTIC DATA (LABS, IMAGING, TESTING) - I reviewed patient records, labs, notes, testing and imaging myself where available.  Lab Results  Component Value Date   WBC 5.7 10/27/2019   HGB 12.8 10/27/2019   HCT 38.7 10/27/2019   MCV 93 10/27/2019   PLT 264 10/27/2019      Component Value Date/Time   NA 140 10/27/2019 0948   K 4.8 10/27/2019 0948   CL 102 10/27/2019 0948   CO2 25 10/27/2019 0948   GLUCOSE 85 10/27/2019 0948   GLUCOSE 102 (H) 09/27/2018 1005   BUN 11 10/27/2019 0948   CREATININE 0.91 10/27/2019 0948   CALCIUM 9.5 10/27/2019 0948   PROT 6.7 10/27/2019 0948   ALBUMIN 4.0 10/27/2019 0948   AST 17 10/27/2019 0948   ALT 13 10/27/2019 0948   ALKPHOS 113 10/27/2019 0948   BILITOT 0.4 10/27/2019 0948   GFRNONAA 73 10/27/2019 0948   GFRAA 84 10/27/2019 0948   Lab Results  Component Value Date   CHOL 213 (H) 10/27/2019   HDL 40 10/27/2019   LDLCALC 147  (H) 10/27/2019   TRIG 145 10/27/2019   CHOLHDL 5.3 (H) 10/27/2019   Lab Results  Component Value Date   HGBA1C 5.2 10/27/2019   Lab Results  Component Value Date   VITAMINB12 750 10/27/2019   Lab Results  Component Value Date   TSH 3.370 10/27/2019     11/23/19 MRI lumbar spine [I reviewed images myself and agree with interpretation. -VRP]  - no spinal stenosis or foraminal narrowing     ASSESSMENT AND PLAN  54 y.o. year old female here with:   Dx:  1. Pain in both lower extremities   2. Hip pain   3. Numbness     PLAN:  INTERMITTENT, POSITIONAL NERVE PAIN (left > right; hip, thigh, knee, legs) - refer to PT evaluation - follow up with PCP re: nutrition, exercise, weight mgmt  Orders Placed This Encounter  Procedures  . Ambulatory referral to Physical Therapy   Return for return to PCP, pending if symptoms worsen or fail to improve.    Penni Bombard, MD 03/12/1516, 6:16 AM Certified in Neurology, Neurophysiology and Neuroimaging  [facility] [address], [place], [place] [address] [phone]

## 2020-01-22 ENCOUNTER — Encounter: Payer: Self-pay | Admitting: Physician Assistant

## 2020-01-23 ENCOUNTER — Ambulatory Visit: Payer: Managed Care, Other (non HMO) | Admitting: Neurology

## 2020-01-26 ENCOUNTER — Other Ambulatory Visit: Payer: Self-pay

## 2020-01-26 ENCOUNTER — Ambulatory Visit: Payer: Managed Care, Other (non HMO) | Attending: Diagnostic Neuroimaging

## 2020-01-26 DIAGNOSIS — G8929 Other chronic pain: Secondary | ICD-10-CM | POA: Diagnosis present

## 2020-01-26 DIAGNOSIS — M5441 Lumbago with sciatica, right side: Secondary | ICD-10-CM | POA: Insufficient documentation

## 2020-01-26 DIAGNOSIS — M25552 Pain in left hip: Secondary | ICD-10-CM | POA: Insufficient documentation

## 2020-01-26 DIAGNOSIS — M7062 Trochanteric bursitis, left hip: Secondary | ICD-10-CM | POA: Diagnosis present

## 2020-01-26 DIAGNOSIS — M5416 Radiculopathy, lumbar region: Secondary | ICD-10-CM | POA: Diagnosis present

## 2020-01-26 NOTE — Therapy (Signed)
[facility] [address] [place], [place], [address] Phone: [phone]   Fax:  [phone]  Physical Therapy Evaluation  Patient Details  Name: Denise Gamble MRN: [id number] Date of Birth: January 19, 1966 No data recorded  Encounter Date: 01/26/2020   PT End of Session - 01/26/20 1607    Visit Number 1    Number of Visits 17    Date for PT Re-Evaluation 03/22/20    Authorization Type [employer] (30 visit max); Eval 01/26/20    PT Start Time 1400    PT Stop Time 1445    PT Time Calculation (min) 45 min    Activity Tolerance Patient tolerated treatment well    Behavior During Therapy WFL for tasks assessed/performed           Past Medical History:  Diagnosis Date  . Allergy   . Asthma   . GERD (gastroesophageal reflux disease)   . History of MRSA infection   . Miscarriage 2013   multiple   . Paresthesia of lower extremity   . Peripheral neuropathy 2019   left foot  . Plantar fasciitis of left foot   . Thoracic outlet syndrome   . Urinary incontinence     Past Surgical History:  Procedure Laterality Date  . DILATION AND CURETTAGE OF UTERUS    . LYMPHADENECTOMY  1979    There were no vitals filed for this visit.    Subjective Assessment - 01/26/20 1551    Subjective Patient reports she had plantarfascitis in L foot in 2018. Her onset of her symptoms of leg burning and hip pain was after plantar fasciitis. She has has multiple injection in left foot for plantar fasciitis. Pt reports in 01/2019, she started developing L hip pain that radiated into L knee. Shortly after that, in 05/2019, she started having buring sensation from her hips, to knees to bil shins with certain positioning, bed mobility and transfers. pt reports that she is trying to sleep on her back as sleeping on either side makes it difficult. pt reports getting in and out of the car, in and out of chair and bed causes hip pain and burning. Pt is  currently seeking chriopractic help on weekly basis.    Pertinent History TOS, peripheral neuropathy, chronic LBP    Limitations Sitting;Lifting;Standing;Walking;House hold activities    How long can you sit comfortably? 30 min    How long can you stand comfortably? 10 min    How long can you walk comfortably? 15-20 min    Patient Stated Goals improve pain    Currently in Pain? Yes    Pain Score 6     Pain Location Hip    Pain Orientation Left    Pain Descriptors / Indicators Throbbing;Aching;Tightness;Sharp    Pain Type Chronic pain    Pain Radiating Towards L knee    Pain Onset More than a month ago    Pain Frequency Intermittent    Aggravating Factors  sleeping on sides, getting in and out of cars, bed mobility, transfers    Pain Relieving Factors position change    Multiple Pain Sites Yes    Pain Score 6    Pain Location Back    Pain Orientation Right;Left    Pain Descriptors / Indicators Radiating;Burning    Pain Type Chronic pain    Pain Onset More than a month ago    Pain Frequency Intermittent    Aggravating Factors  bed mobility, transfers, sleeping on back  Pain Relieving Factors position changes              Outpatient Physical Therapy Neuro Evaluation Medical Diagnosis: Lumbar radiculopathy, L hip pain  Referring Physician: Dr. Andrey Spearman Onset Date: 01/09/20 Hand dominance: Right Next MD visit: none Prior therapy: none  Precautions: None  Weight bearing restrictions: No  Has patient fallen in last 6 months? No, Prior level of function: Independent    MMT Right 01/26/2020 Left 01/26/2020  Hip flexion 5/5 3+/5  Hip extension 5/5 3+/5  Hip abduction 5/5 4+/5  Knee flexion 5/5 5/5  Knee extension 5/5 5/5  Ankle dorsiflexion 5/5 5/5  Ankle plantarflexion 5/5 5/5  Ankle inversion 5/5 5/5  Ankle eversion 5/5 5/5   Lumbar AROM Flexion, R lateral flexion, L lateral flexion: 100% without pain, no burning sensation provoked with overpressure  with lateral flexion Extension: 70% with pain in back, no burning sensation provoked  Pt tried getting in and out of bed and chair: pt able to provoke burning sensation Sitting lumbar lateral flexion: Right and left: pt able to provoke burning sensation in her hips  Palpation: tender over L greater trochanter, increased muscle tone in L lumbar praspinalis  Nerve tension: + for SLR at 70 deg with burning sensation in R hip    Treatment: Grade IV PA mobilization at L5, T12, T8  TherEx: Supine neural flossing with ankle PF and DF with patient's leg in 55 deg flexion: 30x Seated at EOB: R LE straight and ankle PF: patient perform anterior and posterior pelvis tilts: 20x pt educated not to overly stretch nerves, performing this exercise throughout the day Performing leg shaking (with R knee straight and pt shakes leg in/out with hip IR/ER) for neural relaxation: 20x  Current HEP: Seated EOB neural mobilization with LE straight, unilateral, and pt perform anterior and posterior pelvis tilts Seated EOB neural relaxation with LE straight, unilatera, pt performs hip IR and ER to "shake" leg             Objective measurements completed on examination: See above findings.               PT Education - 01/26/20 1556    Education Details Pt educated on potential greater trochanteric bursitis and lumbar radiculopathy causing her symptoms    Person(s) Educated Patient    Methods Explanation    Comprehension Verbalized understanding            PT Short Term Goals - 01/26/20 1557      PT SHORT TERM GOAL #1   Title Pt will tolerate sleeping on either side for 1 hour with <2/10 pain in hip to improve frequent changing of positions during sleeping.    Baseline Can't sleep on sides due to pain (eval)    Time 4    Period Weeks    Status New    Target Date 02/23/20      PT SHORT TERM GOAL #2   Title Pt will demo 4/5 MMT in L hip to improve strength with transfers     Baseline 3+/5 grossly    Time 4    Period Weeks    Status New    Target Date 02/23/20      PT SHORT TERM GOAL #3   Title Patient will report no burning sensation below her knees with bed mobility and transfers to improve function    Baseline burning sensation travels down to anterior shins bil with bed mobility and transfers  Time 4    Period Weeks    Status New    Target Date 02/23/20             PT Long Term Goals - 01/26/20 1559      PT LONG TERM GOAL #1   Title Patient will report <3/10 pain in left hip with 30 min of standing/walking to improve shopping    Baseline 6/10 pain 15-20 min (eval)    Time 8    Period Weeks    Status New    Target Date 03/22/20      PT LONG TERM GOAL #2   Title Patient will be able to sleep on her sides for 2-3 hours without waking up from pain to improve sleeping.    Baseline unable to sleep on sides (Eval)    Time 8    Period Weeks    Status New    Target Date 03/22/20      PT LONG TERM GOAL #3   Title Pt will demo 5/5 strength in L hip to improve functional strnegth with functional activities.    Baseline 3+/5 grossly (eval)    Time 8    Period Weeks    Status New    Target Date 03/22/20      PT LONG TERM GOAL #4   Title LEFS goal    Baseline TBD                  Plan - 01/26/20 1601    Clinical Impression Statement Patient is a 54 y.o. female who was seen today for physical therapy evaluation and treatment for L hip pain and bil lumbar radiculopathy with burning sensation in bil LE. Objective findings indicate decreased L hip strength grossly, decreased lumbar segmental mobility, soft tissue restrictions in lumbar spine, increased neural tension in R LE, and pain. These impairments are limiting patient from performing bed mobility, transfers, prolonged standing, walking, and stairs. Patient will benefit from skilled PT to address these symptoms and improve overall function.    Personal Factors and Comorbidities  Comorbidity 2    Comorbidities LBP, L hip pain, L plantar fasciitis    Examination-Activity Limitations Bed Mobility;Carry;Sleep;Squat;Stairs;Stand;Transfers;Toileting    Examination-Participation Restrictions Church;Cleaning;Community Activity;Driving;Laundry;Meal Prep;Shop;Yard Work    [employer] Stable/Uncomplicated    Clinical Decision Making Moderate    Rehab Potential Good    PT Frequency 2x / week    PT Duration 8 weeks    PT Treatment/Interventions ADLs/Self Care Home Management;Cryotherapy;Moist Heat;Gait training;Stair training;Functional mobility training;Therapeutic activities;Therapeutic exercise;Balance training;Neuromuscular re-education;Manual techniques;Joint Manipulations;Spinal Manipulations;Patient/family education    PT Next Visit Plan Give patient LEFS, reviewe HEP (seated neural flossing    PT Home Exercise Plan Seated with R LE straight: pt performs anterior and posterior pelvis tilts while sitting at edge of bed with R LE straight and ankle PF: 20x R and L    Consulted and Agree with Plan of Care Patient           Patient will benefit from skilled therapeutic intervention in order to improve the following deficits and impairments:  Decreased range of motion, Decreased strength, Increased muscle spasms, Impaired flexibility, Impaired sensation, Postural dysfunction, Pain  Visit Diagnosis: Pain in left hip  Lumbar radicular pain  Trochanteric bursitis of left hip  Chronic bilateral low back pain with right-sided sciatica     Problem List Patient Active Problem List   Diagnosis Date Noted  . Paresthesia of lower extremity 12/01/2019  . Urinary incontinence 10/24/2019  .  Reactive airway disease 10/24/2019  . Thoracic outlet syndrome 10/24/2019  . Environmental and seasonal allergies 10/24/2019  . Caffeine addiction (HCC)-in remission 10/24/2019  . Hypervitaminosis B6 10/24/2019  . Morbid obesity ([place]) 10/24/2019  . Plantar fascia  syndrome 10/24/2019  . Hyperlipidemia 10/24/2019  . Neuropathy- b/l upper legs-   seeing ortho for this-[facility] 10/24/2019  . Routine physical examination 12/14/2016  . Peripheral neuropathy 12/14/2016  . Overweight 09/15/2016  . Asthma 09/14/2016    Kerrie Pleasure, PT 01/26/2020, 4:18 PM  [facility] [address] [place], [place], [address] Phone: [phone]   Fax:  [phone]  Name: Denise Gamble MRN: [id number] Date of Birth: 07-18-1966

## 2020-01-29 ENCOUNTER — Encounter: Payer: Self-pay | Admitting: Family Medicine

## 2020-01-29 ENCOUNTER — Other Ambulatory Visit: Payer: Self-pay

## 2020-01-29 ENCOUNTER — Ambulatory Visit: Payer: Managed Care, Other (non HMO)

## 2020-01-29 DIAGNOSIS — M5441 Lumbago with sciatica, right side: Secondary | ICD-10-CM

## 2020-01-29 DIAGNOSIS — M7062 Trochanteric bursitis, left hip: Secondary | ICD-10-CM

## 2020-01-29 DIAGNOSIS — M25552 Pain in left hip: Secondary | ICD-10-CM

## 2020-01-29 DIAGNOSIS — M5416 Radiculopathy, lumbar region: Secondary | ICD-10-CM

## 2020-01-29 NOTE — Therapy (Signed)
[facility] [address] [place], [place], [address] Phone: [phone]   Fax:  [phone]  Physical Therapy Treatment  Patient Details  Name: Denise Gamble MRN: [id number] Date of Birth: 17-May-1966 No data recorded  Encounter Date: 01/29/2020   PT End of Session - 01/29/20 1854    Number of Visits 17    Date for PT Re-Evaluation 03/22/20    Authorization Type [employer] (30 visit max); Eval 01/26/20    PT Start Time 1750    PT Stop Time 1835    PT Time Calculation (min) 45 min    Activity Tolerance Patient tolerated treatment well    Behavior During Therapy WFL for tasks assessed/performed           Past Medical History:  Diagnosis Date  . Allergy   . Asthma   . GERD (gastroesophageal reflux disease)   . History of MRSA infection   . Miscarriage 2013   multiple   . Paresthesia of lower extremity   . Peripheral neuropathy 2019   left foot  . Plantar fasciitis of left foot   . Thoracic outlet syndrome   . Urinary incontinence     Past Surgical History:  Procedure Laterality Date  . DILATION AND CURETTAGE OF UTERUS    . LYMPHADENECTOMY  1979    There were no vitals filed for this visit.   Subjective Assessment - 01/29/20 1811    Subjective Pt reports she finds that if she climbs in the bed with left knee, she feels the burning consistenly in her leg leg. Symptoms are stable overall.    Pertinent History TOS, peripheral neuropathy, chronic LBP    Limitations Sitting;Lifting;Standing;Walking;House hold activities    How long can you sit comfortably? 30 min    How long can you stand comfortably? 10 min    How long can you walk comfortably? 15-20 min                          Tried kneeling on left knee in bed: pt reported consistent burnign in left thight 3/3 times which went away when she stood normally. When standing on L LE, pt reported milder burning in L LE. STM to R TFL, glut med,  piriformis  TherEx: SL clamshells and reverse clamshells: 2 x 10 R and L   Current HEP: Seated EOB neural mobilization with LE straight, unilateral, and pt perform anterior and posterior pelvis tilts Seated EOB neural relaxation with LE straight, unilatera, pt performs hip IR and ER to "shake" leg  Access Code: [email or web address] URL: [email or web address] Date: 01/29/2020 Prepared by: Markus Jarvis  Exercises Clamshell - 1 x daily - 7 x weekly - 3 sets - 10 reps Sidelying Reverse Clamshell - 1 x daily - 7 x weekly - 3 sets - 10 reps Supine Piriformis Stretch with Foot on Ground - 3 x daily - 7 x weekly - 3 reps - 30 hold          PT Education - 01/29/20 1853    Education Details Pt educated on transverse abdominis contraction when getting in and out of bed to improve active stabilization. Pt educated on modifiying daily activities to reduce flare ups.    Person(s) Educated Patient    Methods Explanation    Comprehension Verbalized understanding            PT Short Term Goals - 01/26/20 1557      PT SHORT  TERM GOAL #1   Title Pt will tolerate sleeping on either side for 1 hour with <2/10 pain in hip to improve frequent changing of positions during sleeping.    Baseline Can't sleep on sides due to pain (eval)    Time 4    Period Weeks    Status New    Target Date 02/23/20      PT SHORT TERM GOAL #2   Title Pt will demo 4/5 MMT in L hip to improve strength with transfers    Baseline 3+/5 grossly    Time 4    Period Weeks    Status New    Target Date 02/23/20      PT SHORT TERM GOAL #3   Title Patient will report no burning sensation below her knees with bed mobility and transfers to improve function    Baseline burning sensation travels down to anterior shins bil with bed mobility and transfers    Time 4    Period Weeks    Status New    Target Date 02/23/20             PT Long Term Goals - 01/26/20 1559      PT LONG TERM GOAL #1   Title Patient  will report <3/10 pain in left hip with 30 min of standing/walking to improve shopping    Baseline 6/10 pain 15-20 min (eval)    Time 8    Period Weeks    Status New    Target Date 03/22/20      PT LONG TERM GOAL #2   Title Patient will be able to sleep on her sides for 2-3 hours without waking up from pain to improve sleeping.    Baseline unable to sleep on sides (Eval)    Time 8    Period Weeks    Status New    Target Date 03/22/20      PT LONG TERM GOAL #3   Title Pt will demo 5/5 strength in L hip to improve functional strnegth with functional activities.    Baseline 3+/5 grossly (eval)    Time 8    Period Weeks    Status New    Target Date 03/22/20      PT LONG TERM GOAL #4   Title LEFS goal    Baseline TBD                 Plan - 01/29/20 1855    Clinical Impression Statement pt tolerated session well. Pt reported her hip pain and burning was almost gone when kneeling when going in and out of the bed at end of the session. Pt seems to have bil greater trochaneric bursitis with lumbar radiculopathy which is contributing to her overal lsymptoms.    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    Rehab Potential Good    PT Frequency 2x / week    PT Duration 8 weeks    PT Treatment/Interventions ADLs/Self Care Home Management;Cryotherapy;Moist Heat;Gait training;Stair training;Functional mobility training;Therapeutic activities;Therapeutic exercise;Balance training;Neuromuscular re-education;Manual techniques;Joint Manipulations;Spinal Manipulations;Patient/family education    PT Next Visit Plan Give patient LEFS, reviewe HEP (seated neural  flossing    PT Home Exercise Plan Seated with R LE straight: pt performs anterior and posterior pelvis tilts while sitting at edge  of bed with R LE straight and ankle PF: 20x R and L    Consulted and Agree with Plan of Care Patient           Patient will benefit from skilled therapeutic intervention in order to improve the following deficits and impairments:  Decreased range of motion, Decreased strength, Increased muscle spasms, Impaired flexibility, Impaired sensation, Postural dysfunction, Pain  Visit Diagnosis: Pain in left hip  Lumbar radicular pain  Trochanteric bursitis of left hip  Chronic bilateral low back pain with right-sided sciatica     Problem List Patient Active Problem List   Diagnosis Date Noted  . Paresthesia of lower extremity 12/01/2019  . Urinary incontinence 10/24/2019  . Reactive airway disease 10/24/2019  . Thoracic outlet syndrome 10/24/2019  . Environmental and seasonal allergies 10/24/2019  . Caffeine addiction (HCC)-in remission 10/24/2019  . Hypervitaminosis B6 10/24/2019  . Morbid obesity ([facility]) 10/24/2019  . Plantar fascia syndrome 10/24/2019  . Hyperlipidemia 10/24/2019  . Neuropathy- b/l upper legs-   seeing ortho for this-[facility] 10/24/2019  . Routine physical examination 12/14/2016  . Peripheral neuropathy 12/14/2016  . Overweight 09/15/2016  . Asthma 09/14/2016    Kerrie Pleasure 01/29/2020, 6:57 PM  [facility] [address] [place], [place], [address] Phone: [phone]   Fax:  [phone]  Name: Denise Gamble MRN: [id number] Date of Birth: 04-Jan-1966

## 2020-02-02 ENCOUNTER — Other Ambulatory Visit: Payer: Self-pay

## 2020-02-02 ENCOUNTER — Ambulatory Visit: Payer: Managed Care, Other (non HMO)

## 2020-02-02 DIAGNOSIS — M5441 Lumbago with sciatica, right side: Secondary | ICD-10-CM

## 2020-02-02 DIAGNOSIS — M5416 Radiculopathy, lumbar region: Secondary | ICD-10-CM

## 2020-02-02 DIAGNOSIS — M25552 Pain in left hip: Secondary | ICD-10-CM

## 2020-02-02 DIAGNOSIS — M7062 Trochanteric bursitis, left hip: Secondary | ICD-10-CM

## 2020-02-02 NOTE — Therapy (Signed)
[facility] [address] [place], [place], [address] Phone: [phone]   Fax:  [phone]  Physical Therapy Treatment  Patient Details  Name: Denise Gamble MRN: [id number] Date of Birth: Sep 04, 1965 No data recorded  Encounter Date: 02/02/2020   PT End of Session - 02/02/20 1621    Visit Number 3    Number of Visits 17    Date for PT Re-Evaluation 03/22/20    Authorization Type [employer] (30 visit max); Eval 01/26/20    PT Start Time 1530    PT Stop Time 1625    PT Time Calculation (min) 55 min           Past Medical History:  Diagnosis Date  . Allergy   . Asthma   . GERD (gastroesophageal reflux disease)   . History of MRSA infection   . Miscarriage 2013   multiple   . Paresthesia of lower extremity   . Peripheral neuropathy 2019   left foot  . Plantar fasciitis of left foot   . Thoracic outlet syndrome   . Urinary incontinence     Past Surgical History:  Procedure Laterality Date  . DILATION AND CURETTAGE OF UTERUS    . LYMPHADENECTOMY  1979    There were no vitals filed for this visit.   Subjective Assessment - 02/02/20 1534    Subjective Pt reports hips were sore after ast time. She [place] the zoo and felt okay She still feels burning in hip with certain positions.    Pertinent History TOS, peripheral neuropathy, chronic LBP    Limitations Sitting;Lifting;Standing;Walking;House hold activities    How long can you sit comfortably? 30 min    How long can you stand comfortably? 10 min    How long can you walk comfortably? 15-20 min              STM to bil piriformis, glut med, TFL region Grade IV PA mobilization in lumbar spine  TherEx: SL clamshells : 2 x 10 R and L Supine hooklying: ab bracing with marching: 20x R and L Manually stretched hip flexors/TFL/ITband bil Supine 90/90 heel taps: 2 x 10 R and L IT band foam roll massage bil   Current HEP: Seated EOB neural  mobilization with LE straight, unilateral, and pt perform anterior and posterior pelvis tilts Seated EOB neural relaxation with LE straight, unilatera, pt performs hip IR and ER to "shake" leg  Access Code: [email or web address] URL: [email or web address] Date: 02/02/2020 Prepared by: Markus Jarvis  Exercises Clamshell - 1 x daily - 7 x weekly - 3 sets - 10 reps Sidelying Reverse Clamshell - 1 x daily - 7 x weekly - 3 sets - 10 reps Supine Piriformis Stretch with Foot on Ground - 3 x daily - 7 x weekly - 3 reps - 30 hold Supine March - 1 x daily - 7 x weekly - 2 sets - 10 reps Supine 90/90 Alternating Toe Touch - 1 x daily - 7 x weekly - 2 sets - 10 reps                              PT Short Term Goals - 01/26/20 1557      PT SHORT TERM GOAL #1   Title Pt will tolerate sleeping on either side for 1 hour with <2/10 pain in hip to improve frequent changing of positions during sleeping.  Baseline Can't sleep on sides due to pain (eval)    Time 4    Period Weeks    Status New    Target Date 02/23/20      PT SHORT TERM GOAL #2   Title Pt will demo 4/5 MMT in L hip to improve strength with transfers    Baseline 3+/5 grossly    Time 4    Period Weeks    Status New    Target Date 02/23/20      PT SHORT TERM GOAL #3   Title Patient will report no burning sensation below her knees with bed mobility and transfers to improve function    Baseline burning sensation travels down to anterior shins bil with bed mobility and transfers    Time 4    Period Weeks    Status New    Target Date 02/23/20             PT Long Term Goals - 01/26/20 1559      PT LONG TERM GOAL #1   Title Patient will report <3/10 pain in left hip with 30 min of standing/walking to improve shopping    Baseline 6/10 pain 15-20 min (eval)    Time 8    Period Weeks    Status New    Target Date 03/22/20      PT LONG TERM GOAL #2   Title Patient will be able to sleep on her sides  for 2-3 hours without waking up from pain to improve sleeping.    Baseline unable to sleep on sides (Eval)    Time 8    Period Weeks    Status New    Target Date 03/22/20      PT LONG TERM GOAL #3   Title Pt will demo 5/5 strength in L hip to improve functional strnegth with functional activities.    Baseline 3+/5 grossly (eval)    Time 8    Period Weeks    Status New    Target Date 03/22/20      PT LONG TERM GOAL #4   Title LEFS goal    Baseline TBD                 Plan - 02/02/20 1612    Clinical Impression Statement Today session was focused on continued improvement in soft tissue and joint mobility in bil hips and lower back. Patient reported improved flexibility, "legs felt lighter" and decreased pain with walking. Patient reported pain provocatin of radiating pain into L ankle and L buttcheek with IT band foam roll massage.    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    Rehab Potential Good    PT Frequency 2x / week    PT Duration 8 weeks    PT Treatment/Interventions ADLs/Self Care Home Management;Cryotherapy;Moist Heat;Gait training;Stair training;Functional mobility training;Therapeutic activities;Therapeutic exercise;Balance training;Neuromuscular re-education;Manual techniques;Joint Manipulations;Spinal Manipulations;Patient/family education    PT Next Visit Plan Give patient LEFS, reviewe HEP (seated neural flossing    PT Home Exercise Plan Seated with R LE straight: pt performs anterior and posterior pelvis tilts while sitting at edge of bed with R LE straight and ankle PF: 20x R and L    Consulted and Agree with Plan of Care  Patient  Patient will benefit from skilled therapeutic intervention in order to improve the following deficits and impairments:  Decreased range of motion, Decreased strength, Increased muscle spasms, Impaired flexibility, Impaired sensation, Postural dysfunction, Pain  Visit Diagnosis: Pain in left hip  Lumbar radicular pain  Trochanteric bursitis of left hip  Chronic bilateral low back pain with right-sided sciatica     Problem List Patient Active Problem List   Diagnosis Date Noted  . Paresthesia of lower extremity 12/01/2019  . Urinary incontinence 10/24/2019  . Reactive airway disease 10/24/2019  . Thoracic outlet syndrome 10/24/2019  . Environmental and seasonal allergies 10/24/2019  . Caffeine addiction (HCC)-in remission 10/24/2019  . Hypervitaminosis B6 10/24/2019  . Morbid obesity ([place]) 10/24/2019  . Plantar fascia syndrome 10/24/2019  . Hyperlipidemia 10/24/2019  . Neuropathy- b/l upper legs-   seeing ortho for this-[facility] 10/24/2019  . Routine physical examination 12/14/2016  . Peripheral neuropathy 12/14/2016  . Overweight 09/15/2016  . Asthma 09/14/2016    Kerrie Pleasure 02/02/2020, 4:22 PM  [facility] [address] [place], [place], [address] Phone: [phone]   Fax:  [phone]  Name: Denise Gamble MRN: [id number] Date of Birth: 31-May-1966

## 2020-02-05 ENCOUNTER — Ambulatory Visit: Payer: Managed Care, Other (non HMO)

## 2020-02-06 ENCOUNTER — Other Ambulatory Visit: Payer: Self-pay

## 2020-02-06 ENCOUNTER — Ambulatory Visit: Payer: Managed Care, Other (non HMO)

## 2020-02-06 DIAGNOSIS — M7062 Trochanteric bursitis, left hip: Secondary | ICD-10-CM

## 2020-02-06 DIAGNOSIS — M5441 Lumbago with sciatica, right side: Secondary | ICD-10-CM

## 2020-02-06 DIAGNOSIS — M5416 Radiculopathy, lumbar region: Secondary | ICD-10-CM

## 2020-02-06 DIAGNOSIS — M25552 Pain in left hip: Secondary | ICD-10-CM | POA: Diagnosis not present

## 2020-02-06 NOTE — Therapy (Signed)
[facility] [address] [place], [place], [address] Phone: [phone]   Fax:  [phone]  Physical Therapy Treatment  Patient Details  Name: Denise Gamble MRN: [id number] Date of Birth: 01-30-1966 No data recorded  Encounter Date: 02/06/2020   PT End of Session - 02/06/20 1245    Visit Number 4    Number of Visits 17    Date for PT Re-Evaluation 03/22/20    Authorization Type [employer] (30 visit max); Eval 01/26/20    PT Start Time 0900   Kemari Mares late 15'   PT Stop Time 0930    PT Time Calculation (min) 30 min    Activity Tolerance Patient tolerated treatment well    Behavior During Therapy WFL for tasks assessed/performed           Past Medical History:  Diagnosis Date  . Allergy   . Asthma   . GERD (gastroesophageal reflux disease)   . History of MRSA infection   . Miscarriage 2013   multiple   . Paresthesia of lower extremity   . Peripheral neuropathy 2019   left foot  . Plantar fasciitis of left foot   . Thoracic outlet syndrome   . Urinary incontinence     Past Surgical History:  Procedure Laterality Date  . DILATION AND CURETTAGE OF UTERUS    . LYMPHADENECTOMY  1979    There were no vitals filed for this visit.   Subjective Assessment - 02/06/20 1243    Subjective Ptreports she was able to go up and down stairs without thinking about her pain and it has been long time since she did that. She is pleased overall. She was sore after last session in lateral thighs.    Pertinent History TOS, peripheral neuropathy, chronic LBP    Limitations Lifting;Standing;Walking;House hold activities    How long can you sit comfortably? 30 min    How long can you stand comfortably? 10 min    How long can you walk comfortably? 15-20 min    Patient Stated Goals improve pain    Currently in Pain? Yes    Pain Score 3     Pain Location Hip                       STM to bil piriformis, glut med, TFL  region Grade IV PA mobilization in lumbar spine IT band foam roll to bil IT bands   Current HEP: Seated EOB neural mobilization with LE straight, unilateral, and pt perform anterior and posterior pelvis tilts Seated EOB neural relaxation with LE straight, unilatera, pt performs hip IR and ER to "shake" leg  Access Code: [email or web address] URL: [email or web address] Date: 02/02/2020 Prepared by: Markus Jarvis  Exercises Clamshell - 1 x daily - 7 x weekly - 3 sets - 10 reps Sidelying Reverse Clamshell - 1 x daily - 7 x weekly - 3 sets - 10 reps Supine Piriformis Stretch with Foot on Ground - 3 x daily - 7 x weekly - 3 reps - 30 hold Supine March - 1 x daily - 7 x weekly - 2 sets - 10 reps Supine 90/90 Alternating Toe Touch - 1 x daily - 7 x weekly - 2 sets - 10 reps                     PT Education - 02/06/20 1245    Education Details Pt was educated on how nerve healing takes time.  Pt was educated on gradually working on tissue hypersensitivity by modifying activities that dont irritate her pain.    Person(s) Educated Patient    Methods Explanation    Comprehension Verbalized understanding            PT Short Term Goals - 01/26/20 1557      PT SHORT TERM GOAL #1   Title Pt will tolerate sleeping on either side for 1 hour with <2/10 pain in hip to improve frequent changing of positions during sleeping.    Baseline Can't sleep on sides due to pain (eval)    Time 4    Period Weeks    Status New    Target Date 02/23/20      PT SHORT TERM GOAL #2   Title Pt will demo 4/5 MMT in L hip to improve strength with transfers    Baseline 3+/5 grossly    Time 4    Period Weeks    Status New    Target Date 02/23/20      PT SHORT TERM GOAL #3   Title Patient will report no burning sensation below her knees with bed mobility and transfers to improve function    Baseline burning sensation travels down to anterior shins bil with bed mobility and transfers    Time  4    Period Weeks    Status New    Target Date 02/23/20             PT Long Term Goals - 01/26/20 1559      PT LONG TERM GOAL #1   Title Patient will report <3/10 pain in left hip with 30 min of standing/walking to improve shopping    Baseline 6/10 pain 15-20 min (eval)    Time 8    Period Weeks    Status New    Target Date 03/22/20      PT LONG TERM GOAL #2   Title Patient will be able to sleep on her sides for 2-3 hours without waking up from pain to improve sleeping.    Baseline unable to sleep on sides (Eval)    Time 8    Period Weeks    Status New    Target Date 03/22/20      PT LONG TERM GOAL #3   Title Pt will demo 5/5 strength in L hip to improve functional strnegth with functional activities.    Baseline 3+/5 grossly (eval)    Time 8    Period Weeks    Status New    Target Date 03/22/20      PT LONG TERM GOAL #4   Title LEFS goal    Baseline TBD                 Plan - 02/06/20 1244    Clinical Impression Statement Today's session was focused on manual therapy to improve soft tissue flexibility and pain management.    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    Rehab Potential Good    PT Frequency 2x / week    PT Duration 8 weeks    PT Treatment/Interventions ADLs/Self Care Home Management;Cryotherapy;Moist Heat;Gait training;Stair training;Functional mobility training;Therapeutic activities;Therapeutic exercise;Balance training;Neuromuscular re-education;Manual techniques;Joint Manipulations;Spinal Manipulations;Patient/family education    PT Next Visit Plan Give patient LEFS, reviewe HEP (  seated neural flossing     PT Home Exercise Plan Seated with R LE straight: pt performs anterior and posterior pelvis tilts while sitting at edge of bed with R LE straight and ankle PF: 20x R and L    Consulted and Agree with Plan of Care Patient           Patient will benefit from skilled therapeutic intervention in order to improve the following deficits and impairments:  Decreased range of motion, Decreased strength, Increased muscle spasms, Impaired flexibility, Impaired sensation, Postural dysfunction, Pain  Visit Diagnosis: Pain in left hip  Lumbar radicular pain  Trochanteric bursitis of left hip  Chronic bilateral low back pain with right-sided sciatica     Problem List Patient Active Problem List   Diagnosis Date Noted  . Paresthesia of lower extremity 12/01/2019  . Urinary incontinence 10/24/2019  . Reactive airway disease 10/24/2019  . Thoracic outlet syndrome 10/24/2019  . Environmental and seasonal allergies 10/24/2019  . Caffeine addiction (HCC)-in remission 10/24/2019  . Hypervitaminosis B6 10/24/2019  . Morbid obesity ([place]) 10/24/2019  . Plantar fascia syndrome 10/24/2019  . Hyperlipidemia 10/24/2019  . Neuropathy- b/l upper legs-   seeing ortho for this-[facility] 10/24/2019  . Routine physical examination 12/14/2016  . Peripheral neuropathy 12/14/2016  . Overweight 09/15/2016  . Asthma 09/14/2016    Kerrie Pleasure 02/06/2020, 12:46 PM  [facility] [address] [place], [place], [address] Phone: [phone]   Fax:  [phone]  Name: Kemper Hochman MRN: [id number] Date of Birth: 06-07-1966

## 2020-02-09 ENCOUNTER — Other Ambulatory Visit: Payer: Self-pay

## 2020-02-09 ENCOUNTER — Ambulatory Visit: Payer: Managed Care, Other (non HMO) | Attending: Diagnostic Neuroimaging

## 2020-02-09 DIAGNOSIS — M5441 Lumbago with sciatica, right side: Secondary | ICD-10-CM | POA: Insufficient documentation

## 2020-02-09 DIAGNOSIS — M5416 Radiculopathy, lumbar region: Secondary | ICD-10-CM | POA: Insufficient documentation

## 2020-02-09 DIAGNOSIS — G8929 Other chronic pain: Secondary | ICD-10-CM | POA: Insufficient documentation

## 2020-02-09 DIAGNOSIS — M25552 Pain in left hip: Secondary | ICD-10-CM | POA: Insufficient documentation

## 2020-02-09 DIAGNOSIS — M7062 Trochanteric bursitis, left hip: Secondary | ICD-10-CM

## 2020-02-09 NOTE — Therapy (Signed)
[facility] [address] [place], [place], [address] Phone: [phone]   Fax:  [phone]  Physical Therapy Treatment  Patient Details  Name: Denise Gamble MRN: [id number] Date of Birth: June 11, 1966 No data recorded  Encounter Date: 02/09/2020   PT End of Session - 02/09/20 1003    Visit Number 5    Number of Visits 17    Date for PT Re-Evaluation 03/22/20    Authorization Type [employer] (30 visit max); Eval 01/26/20    PT Start Time 0900    PT Stop Time 0930   Pt late by 15'   PT Time Calculation (min) 30 min    Activity Tolerance Patient tolerated treatment well    Behavior During Therapy [facility] for tasks assessed/performed           Past Medical History:  Diagnosis Date   Allergy    Asthma    GERD (gastroesophageal reflux disease)    History of MRSA infection    Miscarriage 2013   multiple    Paresthesia of lower extremity    Peripheral neuropathy 2019   left foot   Plantar fasciitis of left foot    Thoracic outlet syndrome    Urinary incontinence     Past Surgical History:  Procedure Laterality Date   DILATION AND CURETTAGE OF UTERUS     LYMPHADENECTOMY  1979    There were no vitals filed for this visit.   Subjective Assessment - 02/09/20 1001    Subjective Pt reports she still gets burning in leg intermittently but overall pain is better.    Pertinent History TOS, peripheral neuropathy, chronic LBP    Limitations Lifting;Standing;Walking;House hold activities    How long can you sit comfortably? 30 min    How long can you stand comfortably? 10 min    How long can you walk comfortably? 15-20 min    Patient Stated Goals improve pain    Currently in Pain? Yes    Pain Score 3     Pain Location Hip    Pain Orientation Right;Left                       STM to bil piriformis, glut med, TFL region Grade IV PA mobilization in lumbar spine IT band foam roll to bil IT  bands  TherEx: Q-ped: alternating UE and LE extensions: 2 x 10 R and L tactile and verbal cues for core engagement and maintaining neutral spine Primal push ups: 3 x 20" SL clamshells: green band: 10x R and L   Current HEP: Seated EOB neural mobilization with LE straight, unilateral, and pt perform anterior and posterior pelvis tilts Seated EOB neural relaxation with LE straight, unilatera, pt performs hip IR and ER to "shake" leg  Access Code: [email or web address] URL: [email or web address] Date: 02/02/2020 Prepared by: Markus Jarvis  Exercises Clamshell - 1 x daily - 7 x weekly - 3 sets - 10 reps Sidelying Reverse Clamshell - 1 x daily - 7 x weekly - 3 sets - 10 reps Supine Piriformis Stretch with Foot on Ground - 3 x daily - 7 x weekly - 3 reps - 30 hold Supine March - 1 x daily - 7 x weekly - 2 sets - 10 reps Supine 90/90 Alternating Toe Touch - 1 x daily - 7 x weekly - 2 sets - 10 reps        STM to bil piriformis, glut med, TFL region Grade IV  PA mobilization in lumbar spine IT band foam roll to bil IT bands   Current HEP: Seated EOB neural mobilization with LE straight, unilateral, and pt perform anterior and posterior pelvis tilts Seated EOB neural relaxation with LE straight, unilatera, pt performs hip IR and ER to "shake" leg  Access Code: [email or web address] URL: [email or web address] Date: 02/02/2020 Prepared by: Markus Jarvis  Exercises Clamshell - 1 x daily - 7 x weekly - 3 sets - 10 reps Sidelying Reverse Clamshell - 1 x daily - 7 x weekly - 3 sets - 10 reps Supine Piriformis Stretch with Foot on Ground - 3 x daily - 7 x weekly - 3 reps - 30 hold Supine March - 1 x daily - 7 x weekly - 2 sets - 10 reps Supine 90/90 Alternating Toe Touch - 1 x daily - 7 x weekly - 2 sets - 10 reps                     PT Short Term Goals - 01/26/20 1557      PT SHORT TERM GOAL #1   Title Pt will tolerate sleeping on either side for 1 hour with <2/10  pain in hip to improve frequent changing of positions during sleeping.    Baseline Can't sleep on sides due to pain (eval)    Time 4    Period Weeks    Status New    Target Date 02/23/20      PT SHORT TERM GOAL #2   Title Pt will demo 4/5 MMT in L hip to improve strength with transfers    Baseline 3+/5 grossly    Time 4    Period Weeks    Status New    Target Date 02/23/20      PT SHORT TERM GOAL #3   Title Patient will report no burning sensation below her knees with bed mobility and transfers to improve function    Baseline burning sensation travels down to anterior shins bil with bed mobility and transfers    Time 4    Period Weeks    Status New    Target Date 02/23/20             PT Long Term Goals - 01/26/20 1559      PT LONG TERM GOAL #1   Title Patient will report <3/10 pain in left hip with 30 min of standing/walking to improve shopping    Baseline 6/10 pain 15-20 min (eval)    Time 8    Period Weeks    Status New    Target Date 03/22/20      PT LONG TERM GOAL #2   Title Patient will be able to sleep on her sides for 2-3 hours without waking up from pain to improve sleeping.    Baseline unable to sleep on sides (Eval)    Time 8    Period Weeks    Status New    Target Date 03/22/20      PT LONG TERM GOAL #3   Title Pt will demo 5/5 strength in L hip to improve functional strnegth with functional activities.    Baseline 3+/5 grossly (eval)    Time 8    Period Weeks    Status New    Target Date 03/22/20      PT LONG TERM GOAL #4   Title LEFS goal    Baseline TBD  Plan - 02/09/20 1002    Clinical Impression Statement Pt is demonstrating decreasing tissue sensitivity in bil IT bands as she was able to take little more pressure with foam roll massage. We progressed her exercises with core stabilization today. PA pressure at L5 provoked patient's foot burning sensation in R foot indicating possible lumbar radiculopathy component as  well as hip bursitis.    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    Rehab Potential Good    PT Frequency 2x / week    PT Duration 8 weeks    PT Treatment/Interventions ADLs/Self Care Home Management;Cryotherapy;Moist Heat;Gait training;Stair training;Functional mobility training;Therapeutic activities;Therapeutic exercise;Balance training;Neuromuscular re-education;Manual techniques;Joint Manipulations;Spinal Manipulations;Patient/family education    PT Next Visit Plan Give patient LEFS, reviewe HEP (seated neural flossing    PT Home Exercise Plan Seated with R LE straight: pt performs anterior and posterior pelvis tilts while sitting at edge of bed with R LE straight and ankle PF: 20x R and L; Access Code: [email or web address]: [email or web address]: 07/02/2021Prepared by: Gwenyth Bouillon PatelExercisesProne Press Up - 1 x daily - 7 x weekly - 10 repsBird Dog - 1 x daily - 7 x weekly - 2 sets - 10 repsPrimal Push Up - 1 x daily - 7 x weekly - 1 sets - 3 reps - 20 hold    Consulted and Agree with Plan of Care Patient           Patient will benefit from skilled therapeutic intervention in order to improve the following deficits and impairments:  Decreased range of motion, Decreased strength, Increased muscle spasms, Impaired flexibility, Impaired sensation, Postural dysfunction, Pain  Visit Diagnosis: Lumbar radicular pain  Pain in left hip  Chronic bilateral low back pain with right-sided sciatica  Trochanteric bursitis of left hip     Problem List Patient Active Problem List   Diagnosis Date Noted   Paresthesia of lower extremity 12/01/2019    Urinary incontinence 10/24/2019   Reactive airway disease 10/24/2019   Thoracic outlet syndrome 10/24/2019   Environmental and seasonal allergies 10/24/2019   Caffeine addiction (HCC)-in remission 10/24/2019   Hypervitaminosis B6 10/24/2019   Morbid obesity ([place]) 10/24/2019   Plantar fascia syndrome 10/24/2019   Hyperlipidemia 10/24/2019   Neuropathy- b/l upper legs-   seeing ortho for this-[facility] 10/24/2019   Routine physical examination 12/14/2016   Peripheral neuropathy 12/14/2016   Overweight 09/15/2016   Asthma 09/14/2016    Kerrie Pleasure 02/09/2020, 10:05 AM  [facility] [address] [place], [place], [address] Phone: [phone]   Fax:  [phone]  Name: Mckaela Howley MRN: [id number] Date of Birth: 1966/02/01

## 2020-02-13 ENCOUNTER — Other Ambulatory Visit: Payer: Self-pay

## 2020-02-13 ENCOUNTER — Ambulatory Visit: Payer: Managed Care, Other (non HMO)

## 2020-02-13 DIAGNOSIS — M5416 Radiculopathy, lumbar region: Secondary | ICD-10-CM | POA: Diagnosis not present

## 2020-02-13 DIAGNOSIS — M7062 Trochanteric bursitis, left hip: Secondary | ICD-10-CM

## 2020-02-13 DIAGNOSIS — G8929 Other chronic pain: Secondary | ICD-10-CM

## 2020-02-13 DIAGNOSIS — M25552 Pain in left hip: Secondary | ICD-10-CM

## 2020-02-13 NOTE — Therapy (Signed)
[facility] [address] [place], [place], [address] Phone: [phone]   Fax:  [phone]  Physical Therapy Treatment  Patient Details  Name: Denise Gamble MRN: [id number] Date of Birth: Sep 17, 1965 No data recorded  Encounter Date: 02/13/2020   PT End of Session - 02/13/20 1751    Visit Number 6    Number of Visits 17    Date for PT Re-Evaluation 03/22/20    Authorization Type [employer] (30 visit max); Eval 01/26/20    PT Start Time 1700    PT Stop Time 1745    PT Time Calculation (min) 45 min    Activity Tolerance Patient tolerated treatment well    Behavior During Therapy WFL for tasks assessed/performed           Past Medical History:  Diagnosis Date  . Allergy   . Asthma   . GERD (gastroesophageal reflux disease)   . History of MRSA infection   . Miscarriage 2013   multiple   . Paresthesia of lower extremity   . Peripheral neuropathy 2019   left foot  . Plantar fasciitis of left foot   . Thoracic outlet syndrome   . Urinary incontinence     Past Surgical History:  Procedure Laterality Date  . DILATION AND CURETTAGE OF UTERUS    . LYMPHADENECTOMY  1979    There were no vitals filed for this visit.   Subjective Assessment - 02/13/20 1709    Subjective Pt reports that she had some pain on top of L foot after last session that continued until this morning. Pt reports toay her back is more sore. She is able to sleep on R side little more.    Pertinent History TOS, peripheral neuropathy, chronic LBP    Limitations Lifting;Standing;Walking;House hold activities    How long can you sit comfortably? 30 min    How long can you stand comfortably? 10 min    How long can you walk comfortably? 15-20 min    Patient Stated Goals improve pain                 Manually stretched bil hip flexors and IT band in sidelying position Grade IV PA mobilization at L5 Movement with mobilization: PA pressure  with prone press ups: 10x Q ped: bird dog: 15x R and L Side steps: green band: 3 x 10 steps each way       Access Code: [email or web address] URL: [email or web address] Date: 02/02/2020 Prepared by: Markus Jarvis  Exercises Clamshell - 1 x daily - 7 x weekly - 3 sets - 10 reps Sidelying Reverse Clamshell - 1 x daily - 7 x weekly - 3 sets - 10 reps Supine Piriformis Stretch with Foot on Ground - 3 x daily - 7 x weekly - 3 reps - 30 hold Supine March - 1 x daily - 7 x weekly - 2 sets - 10 reps Supine 90/90 Alternating Toe Touch - 1 x daily - 7 x weekly - 2 sets - 10 reps                  PT Short Term Goals - 01/26/20 1557      PT SHORT TERM GOAL #1   Title Pt will tolerate sleeping on either side for 1 hour with <2/10 pain in hip to improve frequent changing of positions during sleeping.    Baseline Can't sleep on sides due to pain (eval)    Time 4  Period Weeks    Status New    Target Date 02/23/20      PT SHORT TERM GOAL #2   Title Pt will demo 4/5 MMT in L hip to improve strength with transfers    Baseline 3+/5 grossly    Time 4    Period Weeks    Status New    Target Date 02/23/20      PT SHORT TERM GOAL #3   Title Patient will report no burning sensation below her knees with bed mobility and transfers to improve function    Baseline burning sensation travels down to anterior shins bil with bed mobility and transfers    Time 4    Period Weeks    Status New    Target Date 02/23/20             PT Long Term Goals - 01/26/20 1559      PT LONG TERM GOAL #1   Title Patient will report <3/10 pain in left hip with 30 min of standing/walking to improve shopping    Baseline 6/10 pain 15-20 min (eval)    Time 8    Period Weeks    Status New    Target Date 03/22/20      PT LONG TERM GOAL #2   Title Patient will be able to sleep on her sides for 2-3 hours without waking up from pain to improve sleeping.    Baseline unable to sleep on sides  (Eval)    Time 8    Period Weeks    Status New    Target Date 03/22/20      PT LONG TERM GOAL #3   Title Pt will demo 5/5 strength in L hip to improve functional strnegth with functional activities.    Baseline 3+/5 grossly (eval)    Time 8    Period Weeks    Status New    Target Date 03/22/20      PT LONG TERM GOAL #4   Title LEFS goal    Baseline TBD                 Plan - 02/13/20 1750    Clinical Impression Statement Today's session was focused on continue to improve mobility in bil hips and lower back. Patient reported improved flexibility and pain in her hips and legs at end of the session. Pt reported pain to be 0/10 in her hips from 5/10 at end of the session. Patient demonstrates fair core stabilization with exercises and needs cueing to engage it to improve stability in spine during exercises.    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    Rehab Potential Good    PT Frequency 2x / week    PT Duration 8 weeks    PT Treatment/Interventions ADLs/Self Care Home Management;Cryotherapy;Moist Heat;Gait training;Stair training;Functional mobility training;Therapeutic activities;Therapeutic exercise;Balance training;Neuromuscular re-education;Manual techniques;Joint Manipulations;Spinal Manipulations;Patient/family education    PT Next Visit Plan Give patient LEFS, reviewe HEP (seated neural flossing    PT Home Exercise Plan Seated with R LE straight: pt performs anterior and posterior pelvis tilts while sitting at edge of bed with R LE straight and ankle PF: 20x R and L; Access Code: [email or web address]: [email or web address]:  07/02/2021Prepared by: Gwenyth Bouillon PatelExercisesProne Press Up - 1 x daily - 7  x weekly - 10 repsBird Dog - 1 x daily - 7 x weekly - 2 sets - 10 repsPrimal Push Up - 1 x daily - 7 x weekly - 1 sets - 3 reps - 20 hold    Consulted and Agree with Plan of Care Patient           Patient will benefit from skilled therapeutic intervention in order to improve the following deficits and impairments:  Decreased range of motion, Decreased strength, Increased muscle spasms, Impaired flexibility, Impaired sensation, Postural dysfunction, Pain  Visit Diagnosis: Lumbar radicular pain  Pain in left hip  Chronic bilateral low back pain with right-sided sciatica  Trochanteric bursitis of left hip     Problem List Patient Active Problem List   Diagnosis Date Noted  . Paresthesia of lower extremity 12/01/2019  . Urinary incontinence 10/24/2019  . Reactive airway disease 10/24/2019  . Thoracic outlet syndrome 10/24/2019  . Environmental and seasonal allergies 10/24/2019  . Caffeine addiction (HCC)-in remission 10/24/2019  . Hypervitaminosis B6 10/24/2019  . Morbid obesity ([place]) 10/24/2019  . Plantar fascia syndrome 10/24/2019  . Hyperlipidemia 10/24/2019  . Neuropathy- b/l upper legs-   seeing ortho for this-[facility] 10/24/2019  . Routine physical examination 12/14/2016  . Peripheral neuropathy 12/14/2016  . Overweight 09/15/2016  . Asthma 09/14/2016    Kerrie Pleasure 02/13/2020, 5:56 PM  [facility] [address] [place], [place], [address] Phone: [phone]   Fax:  [phone]  Name: Denise Gamble MRN: [id number] Date of Birth: September 10, 1965

## 2020-02-13 NOTE — Therapy (Deleted)
err

## 2020-02-16 ENCOUNTER — Other Ambulatory Visit: Payer: Self-pay

## 2020-02-16 ENCOUNTER — Ambulatory Visit: Payer: Managed Care, Other (non HMO)

## 2020-02-16 DIAGNOSIS — M5441 Lumbago with sciatica, right side: Secondary | ICD-10-CM

## 2020-02-16 DIAGNOSIS — M7062 Trochanteric bursitis, left hip: Secondary | ICD-10-CM

## 2020-02-16 DIAGNOSIS — M5416 Radiculopathy, lumbar region: Secondary | ICD-10-CM | POA: Diagnosis not present

## 2020-02-16 DIAGNOSIS — M25552 Pain in left hip: Secondary | ICD-10-CM

## 2020-02-16 NOTE — Therapy (Signed)
[facility] [address] [place], [place], [address] Phone: [phone]   Fax:  [phone]  Physical Therapy Treatment  Patient Details  Name: Denise Gamble MRN: [id number] Date of Birth: 1966/04/30 No data recorded  Encounter Date: 02/16/2020   PT End of Session - 02/16/20 1625    Visit Number 7    Number of Visits 17    Date for PT Re-Evaluation 03/22/20    Authorization Type [employer] (30 visit max); Eval 01/26/20    PT Start Time 1545    PT Stop Time 1615    PT Time Calculation (min) 30 min    Activity Tolerance Patient tolerated treatment well    Behavior During Therapy WFL for tasks assessed/performed           Past Medical History:  Diagnosis Date  . Allergy   . Asthma   . GERD (gastroesophageal reflux disease)   . History of MRSA infection   . Miscarriage 2013   multiple   . Paresthesia of lower extremity   . Peripheral neuropathy 2019   left foot  . Plantar fasciitis of left foot   . Thoracic outlet syndrome   . Urinary incontinence     Past Surgical History:  Procedure Laterality Date  . DILATION AND CURETTAGE OF UTERUS    . LYMPHADENECTOMY  1979    There were no vitals filed for this visit.   Subjective Assessment - 02/16/20 1611    Subjective Pt reports she is feeling much better overall. She is still feel burnign but pain is 2/10 for last couple of days.    Pertinent History TOS, peripheral neuropathy, chronic LBP    Limitations Lifting;Standing;Walking;House hold activities    How long can you sit comfortably? 30 min    How long can you stand comfortably? 10 min    How long can you walk comfortably? 15-20 min    Patient Stated Goals improve pain                Grade IV lumbar PA mobilization around L4-5 STM around l Lumbar praspinalis, lats, quadratus lumborum, bil TFL, bil hip flexors Muscle energy technique with supine hip extensions with hips/knees at 90/90 Stairs: 4x 4  steps with one rail Primal planks: 3 x 25"                      PT Short Term Goals - 01/26/20 1557      PT SHORT TERM GOAL #1   Title Pt will tolerate sleeping on either side for 1 hour with <2/10 pain in hip to improve frequent changing of positions during sleeping.    Baseline Can't sleep on sides due to pain (eval)    Time 4    Period Weeks    Status New    Target Date 02/23/20      PT SHORT TERM GOAL #2   Title Pt will demo 4/5 MMT in L hip to improve strength with transfers    Baseline 3+/5 grossly    Time 4    Period Weeks    Status New    Target Date 02/23/20      PT SHORT TERM GOAL #3   Title Patient will report no burning sensation below her knees with bed mobility and transfers to improve function    Baseline burning sensation travels down to anterior shins bil with bed mobility and transfers    Time 4  Period Weeks    Status New    Target Date 02/23/20             PT Long Term Goals - 01/26/20 1559      PT LONG TERM GOAL #1   Title Patient will report <3/10 pain in left hip with 30 min of standing/walking to improve shopping    Baseline 6/10 pain 15-20 min (eval)    Time 8    Period Weeks    Status New    Target Date 03/22/20      PT LONG TERM GOAL #2   Title Patient will be able to sleep on her sides for 2-3 hours without waking up from pain to improve sleeping.    Baseline unable to sleep on sides (Eval)    Time 8    Period Weeks    Status New    Target Date 03/22/20      PT LONG TERM GOAL #3   Title Pt will demo 5/5 strength in L hip to improve functional strnegth with functional activities.    Baseline 3+/5 grossly (eval)    Time 8    Period Weeks    Status New    Target Date 03/22/20      PT LONG TERM GOAL #4   Title LEFS goal    Baseline TBD                 Plan - 02/16/20 1620    Clinical Impression Statement Today' ssession was focused on continued mobilization of lumbar spine and improving soft tissue  mobility around hips to improve pain and radicular symptoms in her legs. Patient reported no pain at end of the session.    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    Rehab Potential Good    PT Frequency 2x / week    PT Duration 8 weeks    PT Treatment/Interventions ADLs/Self Care Home Management;Cryotherapy;Moist Heat;Gait training;Stair training;Functional mobility training;Therapeutic activities;Therapeutic exercise;Balance training;Neuromuscular re-education;Manual techniques;Joint Manipulations;Spinal Manipulations;Patient/family education    PT Next Visit Plan Give patient LEFS, reviewe HEP (seated neural flossing    PT Home Exercise Plan Seated with R LE straight: pt performs anterior and posterior pelvis tilts while sitting at edge of bed with R LE straight and ankle PF: 20x R and L; Access Code: [email or web address]: [email or web address]: 07/02/2021Prepared by: Gwenyth Bouillon PatelExercisesProne Press Up - 1 x daily - 7 x weekly - 10 repsBird Dog - 1 x daily - 7 x weekly - 2 sets - 10 repsPrimal Push Up - 1 x daily - 7 x weekly - 1 sets - 3 reps - 20 hold    Consulted and Agree with Plan of Care Patient           Patient will benefit from skilled therapeutic intervention in order to improve the following deficits and impairments:  Decreased range of motion, Decreased strength, Increased muscle spasms, Impaired flexibility, Impaired sensation, Postural dysfunction, Pain  Visit Diagnosis: Lumbar radicular pain  Pain in left hip  Chronic bilateral low back pain with right-sided sciatica  Trochanteric bursitis of left hip     Problem  List Patient Active Problem List   Diagnosis Date Noted  . Paresthesia of lower extremity 12/01/2019  . Urinary incontinence 10/24/2019  . Reactive airway disease 10/24/2019  .  Thoracic outlet syndrome 10/24/2019  . Environmental and seasonal allergies 10/24/2019  . Caffeine addiction (HCC)-in remission 10/24/2019  . Hypervitaminosis B6 10/24/2019  . Morbid obesity ([place]) 10/24/2019  . Plantar fascia syndrome 10/24/2019  . Hyperlipidemia 10/24/2019  . Neuropathy- b/l upper legs-   seeing ortho for this-[facility] 10/24/2019  . Routine physical examination 12/14/2016  . Peripheral neuropathy 12/14/2016  . Overweight 09/15/2016  . Asthma 09/14/2016    Kerrie Pleasure 02/16/2020, 4:40 PM  [facility] [address] [place], [place], [address] Phone: [phone]   Fax:  [phone]  Name: Denise Gamble MRN: [id number] Date of Birth: 20-Dec-1965

## 2020-02-19 ENCOUNTER — Other Ambulatory Visit: Payer: Self-pay

## 2020-02-19 ENCOUNTER — Ambulatory Visit: Payer: Managed Care, Other (non HMO)

## 2020-02-19 DIAGNOSIS — M5416 Radiculopathy, lumbar region: Secondary | ICD-10-CM | POA: Diagnosis not present

## 2020-02-19 DIAGNOSIS — M25552 Pain in left hip: Secondary | ICD-10-CM

## 2020-02-19 DIAGNOSIS — G8929 Other chronic pain: Secondary | ICD-10-CM

## 2020-02-19 DIAGNOSIS — M7062 Trochanteric bursitis, left hip: Secondary | ICD-10-CM

## 2020-02-19 NOTE — Therapy (Signed)
[facility] [address] [place], [place], [address] Phone: [phone]   Fax:  [phone]  Physical Therapy Treatment  Patient Details  Name: Denise Gamble MRN: [id number] Date of Birth: [birth year] No data recorded  Encounter Date: 02/19/2020   PT End of Session - 02/19/20 1834    Visit Number 8    Number of Visits 17    Date for PT Re-Evaluation 03/22/20    Authorization Type [employer] (30 visit max); Eval 01/26/20    PT Start Time 1530    PT Stop Time 1620    PT Time Calculation (min) 50 min    Activity Tolerance Patient tolerated treatment well    Behavior During Therapy WFL for tasks assessed/performed           Past Medical History:  Diagnosis Date  . Allergy   . Asthma   . GERD (gastroesophageal reflux disease)   . History of MRSA infection   . Miscarriage 2013   multiple   . Paresthesia of lower extremity   . Peripheral neuropathy 2019   left foot  . Plantar fasciitis of left foot   . Thoracic outlet syndrome   . Urinary incontinence     Past Surgical History:  Procedure Laterality Date  . DILATION AND CURETTAGE OF UTERUS    . LYMPHADENECTOMY  1979    There were no vitals filed for this visit.   Subjective Assessment - 02/19/20 1832    Subjective Overall her ihps are feeling better. THey are not hurting with sleeping at night. Today, back is little sore. Burning in the legs is unchanged. When she is trying to move the leg at night, she will feel burning on side of the leg all the way down to ankle intermittently.    Pertinent History TOS, peripheral neuropathy, chronic LBP    Limitations Lifting;Standing;Walking;House hold activities    How long can you sit comfortably? 30 min    How long can you stand comfortably? 10 min    How long can you walk comfortably? 15-20 min    Patient Stated Goals improve pain    Currently in Pain? Yes    Pain Score 3     Pain Location Back                  asked patient to crawl on bed with her hands and unilateral knee: pt reported provocation of burining sensation in each leg with ipsilateral knee WB  ITBand foam roll massage to bil   No change in burning sensation noted when crawling in bed  Manually resisted hip internal rotation while patient was in quadruped position: 2 x 10 R and L Significant improvement in burning sensation noted  Quadruped hip internal rotation with red band around ankles: 2 x 10 R and L  Quadruped alternating hip internal rotation with isometric contralateral internal rotation hold: 10x R and L  Quadruped manually resisted hip extension and eccentric hip extensions: 10x R and L, cues for proper glut engagement    Access Code: [email or web address] URL: [email or web address] Date: 02/02/2020 Prepared by: Markus Jarvis  Exercises Clamshell - 1 x daily - 7 x weekly - 3 sets - 10 reps Sidelying Reverse Clamshell - 1 x daily - 7 x weekly - 3 sets - 10 reps Supine Piriformis Stretch with Foot on Ground - 3 x daily - 7 x weekly - 3 reps - 30 hold Supine March - 1 x daily - 7 x weekly -  2 sets - 10 reps Supine 90/90 Alternating Toe Touch - 1 x daily - 7 x weekly - 2 sets - 10 reps  /Quadruped hip internal rotation with red band added on 02/19/20                       PT Short Term Goals - 01/26/20 1557      PT SHORT TERM GOAL #1   Title Pt will tolerate sleeping on either side for 1 hour with <2/10 pain in hip to improve frequent changing of positions during sleeping.    Baseline Can't sleep on sides due to pain (eval)    Time 4    Period Weeks    Status New    Target Date 02/23/20      PT SHORT TERM GOAL #2   Title Pt will demo 4/5 MMT in L hip to improve strength with transfers    Baseline 3+/5 grossly    Time 4    Period Weeks    Status New    Target Date 02/23/20      PT SHORT TERM GOAL #3   Title Patient will report no burning sensation below her knees with  bed mobility and transfers to improve function    Baseline burning sensation travels down to anterior shins bil with bed mobility and transfers    Time 4    Period Weeks    Status New    Target Date 02/23/20             PT Long Term Goals - 01/26/20 1559      PT LONG TERM GOAL #1   Title Patient will report <3/10 pain in left hip with 30 min of standing/walking to improve shopping    Baseline 6/10 pain 15-20 min (eval)    Time 8    Period Weeks    Status New    Target Date 03/22/20      PT LONG TERM GOAL #2   Title Patient will be able to sleep on her sides for 2-3 hours without waking up from pain to improve sleeping.    Baseline unable to sleep on sides (Eval)    Time 8    Period Weeks    Status New    Target Date 03/22/20      PT LONG TERM GOAL #3   Title Pt will demo 5/5 strength in L hip to improve functional strnegth with functional activities.    Baseline 3+/5 grossly (eval)    Time 8    Period Weeks    Status New    Target Date 03/22/20      PT LONG TERM GOAL #4   Title LEFS goal    Baseline TBD                 Plan - 02/19/20 1833    Clinical Impression Statement Today's session was focused on improving stability in bil hips and core in functional WB postions. After working on engaging hip internal rotators and gluts, patient reported decreasing burning sensation in lateral hips when WB on hands and unilateral knee on mat table.    Personal Factors and Comorbidities Comorbidity 2    Comorbidities LBP, L hip pain, L plantar fasciitis    Examination-Activity Limitations Bed Mobility;Carry;Sleep;Squat;Stairs;Stand;Transfers;Toileting    Examination-Participation Restrictions Church;Cleaning;Community Activity;Driving;Laundry;Meal Prep;Shop;Yard Work    Stability/Clinical Decision Making Stable/Uncomplicated    Rehab Potential Good    PT Frequency 2x / week  PT Duration 8 weeks    PT Treatment/Interventions ADLs/Self Care Home  Management;Cryotherapy;Moist Heat;Gait training;Stair training;Functional mobility training;Therapeutic activities;Therapeutic exercise;Balance training;Neuromuscular re-education;Manual techniques;Joint Manipulations;Spinal Manipulations;Patient/family education    PT Next Visit Plan Give patient LEFS, reviewe HEP (seated neural flossing    PT Home Exercise Plan Seated with R LE straight: pt performs anterior and posterior pelvis tilts while sitting at edge of bed with R LE straight and ankle PF: 20x R and L; Access Code: [email or web address]: [email or web address]: 07/02/2021Prepared by: Gwenyth Bouillon PatelExercisesProne Press Up - 1 x daily - 7 x weekly - 10 repsBird Dog - 1 x daily - 7 x weekly - 2 sets - 10 repsPrimal Push Up - 1 x daily - 7 x weekly - 1 sets - 3 reps - 20 hold    Consulted and Agree with Plan of Care Patient           Patient will benefit from skilled therapeutic intervention in order to improve the following deficits and impairments:  Decreased range of motion, Decreased strength, Increased muscle spasms, Impaired flexibility, Impaired sensation, Postural dysfunction, Pain  Visit Diagnosis: Lumbar radicular pain  Pain in left hip  Chronic bilateral low back pain with right-sided sciatica  Trochanteric bursitis of left hip     Problem List Patient Active Problem List   Diagnosis Date Noted  . Paresthesia of lower extremity 12/01/2019  . Urinary incontinence 10/24/2019  . Reactive airway disease 10/24/2019  . Thoracic outlet syndrome 10/24/2019  . Environmental and seasonal allergies 10/24/2019  . Caffeine addiction (HCC)-in remission 10/24/2019  . Hypervitaminosis B6 10/24/2019  . Morbid obesity ([place]) 10/24/2019  . Plantar fascia syndrome 10/24/2019  . Hyperlipidemia 10/24/2019  . Neuropathy- b/l upper legs-   seeing ortho for this-[facility] 10/24/2019  . Routine physical examination 12/14/2016  . Peripheral neuropathy 12/14/2016  . Overweight 09/15/2016   . Asthma 09/14/2016    Kerrie Pleasure, PT 02/19/2020, 6:36 PM  [facility] [address] [place], [place], [address] Phone: [phone]   Fax:  [phone]  Name: Denise Gamble MRN: [id number] Date of Birth: 1966/05/16

## 2020-02-23 ENCOUNTER — Other Ambulatory Visit: Payer: Self-pay

## 2020-02-23 ENCOUNTER — Ambulatory Visit: Payer: Managed Care, Other (non HMO)

## 2020-02-23 DIAGNOSIS — G8929 Other chronic pain: Secondary | ICD-10-CM

## 2020-02-23 DIAGNOSIS — M5416 Radiculopathy, lumbar region: Secondary | ICD-10-CM

## 2020-02-23 DIAGNOSIS — M25552 Pain in left hip: Secondary | ICD-10-CM

## 2020-02-23 DIAGNOSIS — M7062 Trochanteric bursitis, left hip: Secondary | ICD-10-CM

## 2020-02-23 NOTE — Therapy (Signed)
[facility] [address] [place], [place], [address] Phone: [phone]   Fax:  [phone]  Physical Therapy Treatment  Patient Details  Name: Denise Gamble MRN: [id number] Date of Birth: 07/16/1966 No data recorded  Encounter Date: 02/23/2020   PT End of Session - 02/23/20 1557    Visit Number 9    Number of Visits 17    Date for PT Re-Evaluation 03/22/20    Authorization Type [employer] (30 visit max); Eval 01/26/20    PT Start Time 1540    PT Stop Time 1620    PT Time Calculation (min) 40 min    Activity Tolerance Patient tolerated treatment well    Behavior During Therapy WFL for tasks assessed/performed           Past Medical History:  Diagnosis Date  . Allergy   . Asthma   . GERD (gastroesophageal reflux disease)   . History of MRSA infection   . Miscarriage 2013   multiple   . Paresthesia of lower extremity   . Peripheral neuropathy 2019   left foot  . Plantar fasciitis of left foot   . Thoracic outlet syndrome   . Urinary incontinence     Past Surgical History:  Procedure Laterality Date  . DILATION AND CURETTAGE OF UTERUS    . LYMPHADENECTOMY  1979    There were no vitals filed for this visit.   Subjective Assessment - 02/23/20 1546    Subjective Symptoms are same    Pertinent History TOS, peripheral neuropathy, chronic LBP    Limitations Lifting;Standing;Walking;House hold activities    How long can you sit comfortably? 30 min    How long can you stand comfortably? 10 min    How long can you walk comfortably? 15-20 min    Patient Stated Goals improve pain    Currently in Pain? Yes    Pain Score 3             Below HEP reviewed and given to patient as HEP. Not to do previous HEP. Sit to stand with 12 lbs 2 x 10 Pt educated to prevent knees from locking when standing at work with stand up desk.    Access Code: [email or web address] URL: [email or web address] Date:  02/23/2020 Prepared by: Markus Jarvis  Exercises Prone Hip Extension - 1 x daily - 7 x weekly - 2 sets - 10 reps Sidelying Hip Abduction - 1 x daily - 7 x weekly - 2 sets - 10 reps Sidelying Hip Adduction - 1 x daily - 7 x weekly - 2 sets - 10 reps Single Leg Stance - 1 x daily - 7 x weekly - 1 sets - 5 reps - 20 hold Sit to Stand with Arms Crossed - 1 x daily - 7 x weekly - 2 sets - 10 reps                        PT Short Term Goals - 01/26/20 1557      PT SHORT TERM GOAL #1   Title Pt will tolerate sleeping on either side for 1 hour with <2/10 pain in hip to improve frequent changing of positions during sleeping.    Baseline Can't sleep on sides due to pain (eval)    Time 4    Period Weeks    Status New    Target Date 02/23/20      PT SHORT TERM GOAL #2   Title  Pt will demo 4/5 MMT in L hip to improve strength with transfers    Baseline 3+/5 grossly    Time 4    Period Weeks    Status New    Target Date 02/23/20      PT SHORT TERM GOAL #3   Title Patient will report no burning sensation below her knees with bed mobility and transfers to improve function    Baseline burning sensation travels down to anterior shins bil with bed mobility and transfers    Time 4    Period Weeks    Status New    Target Date 02/23/20             PT Long Term Goals - 01/26/20 1559      PT LONG TERM GOAL #1   Title Patient will report <3/10 pain in left hip with 30 min of standing/walking to improve shopping    Baseline 6/10 pain 15-20 min (eval)    Time 8    Period Weeks    Status New    Target Date 03/22/20      PT LONG TERM GOAL #2   Title Patient will be able to sleep on her sides for 2-3 hours without waking up from pain to improve sleeping.    Baseline unable to sleep on sides (Eval)    Time 8    Period Weeks    Status New    Target Date 03/22/20      PT LONG TERM GOAL #3   Title Pt will demo 5/5 strength in L hip to improve functional strnegth with  functional activities.    Baseline 3+/5 grossly (eval)    Time 8    Period Weeks    Status New    Target Date 03/22/20      PT LONG TERM GOAL #4   Title LEFS goal    Baseline TBD                 Plan - 02/23/20 1547    Clinical Impression Statement Today's session we focused on more strengthening and stabilization of proximal muscle groups instead of manual therapy.    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    Rehab Potential Good    PT Frequency 2x / week    PT Duration 8 weeks    PT Treatment/Interventions ADLs/Self Care Home Management;Cryotherapy;Moist Heat;Gait training;Stair training;Functional mobility training;Therapeutic activities;Therapeutic exercise;Balance training;Neuromuscular re-education;Manual techniques;Joint Manipulations;Spinal Manipulations;Patient/family education    PT Next Visit Plan Give patient LEFS, reviewe HEP (seated neural flossing    PT Home Exercise Plan Access Code: [email or web address]: [email or web address]: 07/16/2021Prepared by: Gwenyth Bouillon PatelExercisesProne Hip Extension - 1 x daily - 7 x weekly - 2 sets - 10 repsSidelying Hip Abduction - 1 x daily - 7 x weekly - 2 sets - 10 repsSidelying Hip Adduction - 1 x daily - 7 x weekly - 2 sets - 10 repsSingle Leg Stance - 1 x daily - 7 x weekly - 1 sets - 5 reps - 20 holdSit to Stand with Arms Crossed - 1 x daily - 7 x weekly - 2 sets - 10 reps    Consulted and Agree with Plan of Care Patient           Patient will benefit from skilled therapeutic intervention  in order to improve the following deficits and impairments:  Decreased range of motion, Decreased  strength, Increased muscle spasms, Impaired flexibility, Impaired sensation, Postural dysfunction, Pain  Visit Diagnosis: Pain in left hip  Lumbar radicular pain  Chronic bilateral low back pain with right-sided sciatica  Trochanteric bursitis of left hip     Problem List Patient Active Problem List   Diagnosis Date Noted  . Paresthesia of lower extremity 12/01/2019  . Urinary incontinence 10/24/2019  . Reactive airway disease 10/24/2019  . Thoracic outlet syndrome 10/24/2019  . Environmental and seasonal allergies 10/24/2019  . Caffeine addiction (HCC)-in remission 10/24/2019  . Hypervitaminosis B6 10/24/2019  . Morbid obesity ([place]) 10/24/2019  . Plantar fascia syndrome 10/24/2019  . Hyperlipidemia 10/24/2019  . Neuropathy- b/l upper legs-   seeing ortho for this-[facility] 10/24/2019  . Routine physical examination 12/14/2016  . Peripheral neuropathy 12/14/2016  . Overweight 09/15/2016  . Asthma 09/14/2016    Kerrie Pleasure, PT 02/23/2020, 4:00 PM  [facility] [address] [place], [place], [address] Phone: [phone]   Fax:  [phone]  Name: Denise Gamble MRN: [id number] Date of Birth: [birth year]

## 2020-02-26 ENCOUNTER — Other Ambulatory Visit: Payer: Self-pay

## 2020-02-26 ENCOUNTER — Ambulatory Visit: Payer: Managed Care, Other (non HMO)

## 2020-02-26 DIAGNOSIS — G8929 Other chronic pain: Secondary | ICD-10-CM

## 2020-02-26 DIAGNOSIS — M5416 Radiculopathy, lumbar region: Secondary | ICD-10-CM | POA: Diagnosis not present

## 2020-02-26 DIAGNOSIS — M7062 Trochanteric bursitis, left hip: Secondary | ICD-10-CM

## 2020-02-26 DIAGNOSIS — M25552 Pain in left hip: Secondary | ICD-10-CM

## 2020-02-26 NOTE — Therapy (Signed)
[facility] [address] [place], [place], [address] Phone: [phone]   Fax:  [phone]  Physical Therapy Treatment  Patient Details  Name: Denise Gamble MRN: [id number] Date of Birth: Oct 17, 1965 No data recorded  Encounter Date: 02/26/2020   PT End of Session - 02/26/20 1748    Visit Number 10    Number of Visits 17    Date for PT Re-Evaluation 03/22/20    Authorization Type [employer] (30 visit max); Eval 01/26/20    PT Start Time 1515    PT Stop Time 1545    PT Time Calculation (min) 30 min    Activity Tolerance Patient tolerated treatment well    Behavior During Therapy WFL for tasks assessed/performed           Past Medical History:  Diagnosis Date  . Allergy   . Asthma   . GERD (gastroesophageal reflux disease)   . History of MRSA infection   . Miscarriage 2013   multiple   . Paresthesia of lower extremity   . Peripheral neuropathy 2019   left foot  . Plantar fasciitis of left foot   . Thoracic outlet syndrome   . Urinary incontinence     Past Surgical History:  Procedure Laterality Date  . DILATION AND CURETTAGE OF UTERUS    . LYMPHADENECTOMY  1979    There were no vitals filed for this visit.   Subjective Assessment - 02/26/20 1721    Subjective Symptoms may be slightly better.    Pertinent History TOS, peripheral neuropathy, chronic LBP    Limitations Lifting;Standing;Walking;House hold activities    How long can you sit comfortably? 30 min    How long can you stand comfortably? 10 min    How long can you walk comfortably? 15-20 min    Patient Stated Goals improve pain             Q-ped LE extensions: 10x R and L Prone hip extensions: 2 x 10x R and L Sl hip abduction: 2 x 10 R and L SL hip adduction: 2 x 10 R and L Heel sitting to tall kneeling: 20x SLS: 3 x 20" R and L EC     Access Code: [email or web address] URL: [email or web address] Date: 02/23/2020 Prepared by:  Markus Jarvis  Exercises Prone Hip Extension - 1 x daily - 7 x weekly - 2 sets - 10 reps Sidelying Hip Abduction - 1 x daily - 7 x weekly - 2 sets - 10 reps Sidelying Hip Adduction - 1 x daily - 7 x weekly - 2 sets - 10 reps Single Leg Stance - 1 x daily - 7 x weekly - 1 sets - 5 reps - 20 hold (progressed to [facility] 02/26/20) Sit to Stand with Arms Crossed - 1 x daily - 7 x weekly - 2 sets - 10 reps                      PT Short Term Goals - 02/26/20 1741      PT SHORT TERM GOAL #1   Title Pt will tolerate sleeping on either side for 1 hour with <2/10 pain in hip to improve frequent changing of positions during sleeping.    Baseline Can't sleep on sides due to pain (eval); 1 hours + on either side 02/26/20    Time 4    Period Weeks    Status Achieved    Target Date 02/23/20  PT SHORT TERM GOAL #2   Title Pt will demo 4/5 MMT in L hip to improve strength with transfers    Baseline 3+/5 grossly, 5/5 grossly 02/26/20    Time 4    Period Weeks    Status Achieved    Target Date 02/23/20      PT SHORT TERM GOAL #3   Title Patient will report no burning sensation below her knees with bed mobility and transfers to improve function    Baseline burning sensation travels down to anterior shins bil with bed mobility and transfers; burning sensation upto knees mostly intermittently below knee -02/26/20    Time 4    Period Weeks    Status Achieved    Target Date 02/23/20             PT Long Term Goals - 02/26/20 1742      PT LONG TERM GOAL #1   Title Patient will report <3/10 pain in left hip with 30 min of standing/walking to improve shopping    Baseline 6/10 pain 15-20 min (eval); 1 hour before pain goes >3/10 02/26/20    Time 8    Period Weeks    Status Achieved      PT LONG TERM GOAL #2   Title Patient will be able to sleep on her sides for 2-3 hours without waking up from pain to improve sleeping.    Baseline unable to sleep on sides (Eval); able to sleep 1  hour on either side before needing to turn    Time 8    Period Weeks    Status On-going      PT LONG TERM GOAL #3   Title Pt will demo 5/5 strength in L hip to improve functional strnegth with functional activities.    Baseline 3+/5 grossly (eval); 5/5 grossly    Time 8    Period Weeks    Status Achieved      PT LONG TERM GOAL #4   Title LEFS goal    Baseline TBD                 Plan - 02/26/20 1737    Clinical Impression Statement Pt felt burning when WB through knees or when tissues were compressed behing the knee with knee flexion or muscles stretched in legs. But the burning only lasts for 5 sec and descipates after maintaining position. This may be due to hypersensitivity in nervous system. Patient was educated that she is not "damaging" anything when she feels burns and was educated on concept of increased sensitivity of nervous system due to her chronic pain.    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    Rehab Potential Good    PT Frequency 2x / week    PT Duration 8 weeks    PT Treatment/Interventions ADLs/Self Care Home Management;Cryotherapy;Moist Heat;Gait training;Stair training;Functional mobility training;Therapeutic activities;Therapeutic exercise;Balance training;Neuromuscular re-education;Manual techniques;Joint Manipulations;Spinal Manipulations;Patient/family education    PT Next Visit Plan Give patient LEFS, reviewe HEP (seated neural flossing    PT Home Exercise Plan Access Code: [email or web address]: [email or web address]: 07/16/2021Prepared by: Gwenyth Bouillon PatelExercisesProne Hip Extension - 1 x daily - 7 x weekly - 2  sets - 10 repsSidelying Hip Abduction - 1 x daily - 7 x weekly - 2 sets - 10 repsSidelying Hip  Adduction - 1 x daily - 7 x weekly - 2 sets - 10 repsSingle Leg Stance - 1 x daily - 7 x weekly - 1 sets - 5 reps - 20 holdSit to Stand with Arms Crossed - 1 x daily - 7 x weekly - 2 sets - 10 reps    Consulted and Agree with Plan of Care Patient           Patient will benefit from skilled therapeutic intervention in order to improve the following deficits and impairments:  Decreased range of motion, Decreased strength, Increased muscle spasms, Impaired flexibility, Impaired sensation, Postural dysfunction, Pain  Visit Diagnosis: Lumbar radicular pain  Pain in left hip  Chronic bilateral low back pain with right-sided sciatica  Trochanteric bursitis of left hip     Problem List Patient Active Problem List   Diagnosis Date Noted  . Paresthesia of lower extremity 12/01/2019  . Urinary incontinence 10/24/2019  . Reactive airway disease 10/24/2019  . Thoracic outlet syndrome 10/24/2019  . Environmental and seasonal allergies 10/24/2019  . Caffeine addiction (HCC)-in remission 10/24/2019  . Hypervitaminosis B6 10/24/2019  . Morbid obesity ([place]) 10/24/2019  . Plantar fascia syndrome 10/24/2019  . Hyperlipidemia 10/24/2019  . Neuropathy- b/l upper legs-   seeing ortho for this-[facility] 10/24/2019  . Routine physical examination 12/14/2016  . Peripheral neuropathy 12/14/2016  . Overweight 09/15/2016  . Asthma 09/14/2016    Kerrie Pleasure, PT 02/26/2020, 5:49 PM  [facility] [address] [place], [place], [address] Phone: [phone]   Fax:  [phone]  Name: Denise Gamble MRN: [id number] Date of Birth: 02/06/1966

## 2020-03-01 ENCOUNTER — Other Ambulatory Visit: Payer: Self-pay

## 2020-03-01 ENCOUNTER — Ambulatory Visit: Payer: Managed Care, Other (non HMO)

## 2020-03-01 DIAGNOSIS — M5416 Radiculopathy, lumbar region: Secondary | ICD-10-CM | POA: Diagnosis not present

## 2020-03-01 DIAGNOSIS — M25552 Pain in left hip: Secondary | ICD-10-CM

## 2020-03-01 DIAGNOSIS — M7062 Trochanteric bursitis, left hip: Secondary | ICD-10-CM

## 2020-03-01 DIAGNOSIS — G8929 Other chronic pain: Secondary | ICD-10-CM

## 2020-03-01 NOTE — Therapy (Signed)
[facility] [address] [place], [place], [address] Phone: [phone]   Fax:  [phone]  Physical Therapy Treatment  Patient Details  Name: Denise Gamble MRN: [id number] Date of Birth: 11-17-1965 No data recorded  Encounter Date: 03/01/2020   PT End of Session - 03/01/20 1547    Visit Number 11    Number of Visits 17    Date for PT Re-Evaluation 03/22/20    Authorization Type [employer] (30 visit max); Eval 01/26/20    PT Start Time 1530    PT Stop Time 1610    PT Time Calculation (min) 40 min    Activity Tolerance Patient tolerated treatment well    Behavior During Therapy WFL for tasks assessed/performed           Past Medical History:  Diagnosis Date  . Allergy   . Asthma   . GERD (gastroesophageal reflux disease)   . History of MRSA infection   . Miscarriage 2013   multiple   . Paresthesia of lower extremity   . Peripheral neuropathy 2019   left foot  . Plantar fasciitis of left foot   . Thoracic outlet syndrome   . Urinary incontinence     Past Surgical History:  Procedure Laterality Date  . DILATION AND CURETTAGE OF UTERUS    . LYMPHADENECTOMY  1979    There were no vitals filed for this visit.   Subjective Assessment - 03/01/20 1537    Subjective Pt reports she felt really good for the whole day and then she was sitting down and then all the sudden she felt sharp pain in her hip.    Pertinent History TOS, peripheral neuropathy, chronic LBP    Limitations Lifting;Standing;Walking;House hold activities    How long can you sit comfortably? 30 min    How long can you stand comfortably? 10 min    How long can you walk comfortably? 15-20 min    Patient Stated Goals improve pain    Currently in Pain? Yes    Pain Location Hip    Pain Orientation Right;Left    Pain Descriptors / Indicators Aching    Pain Type Chronic pain    Pain Onset More than a month ago    Pain Frequency Several days a  week                  treatment Supine bridge: 2 x 10 Supine bridge with hamstring curls: with ball: 2 x 10 Supine deadbug with ball: 4 x 5 LE only SLS on airex: 3 x 30" R and L  Lateral step downs: 6" 2 x 10 R and L Bil leg press: 80 lbs 2 x 10                      PT Short Term Goals - 02/26/20 1741      PT SHORT TERM GOAL #1   Title Pt will tolerate sleeping on either side for 1 hour with <2/10 pain in hip to improve frequent changing of positions during sleeping.    Baseline Can't sleep on sides due to pain (eval); 1 hours + on either side 02/26/20    Time 4    Period Weeks    Status Achieved    Target Date 02/23/20      PT SHORT TERM GOAL #2   Title Pt will demo 4/5 MMT in L hip to improve strength with transfers    Baseline  3+/5 grossly, 5/5 grossly 02/26/20    Time 4    Period Weeks    Status Achieved    Target Date 02/23/20      PT SHORT TERM GOAL #3   Title Patient will report no burning sensation below her knees with bed mobility and transfers to improve function    Baseline burning sensation travels down to anterior shins bil with bed mobility and transfers; burning sensation upto knees mostly intermittently below knee -02/26/20    Time 4    Period Weeks    Status Achieved    Target Date 02/23/20             PT Long Term Goals - 02/26/20 1742      PT LONG TERM GOAL #1   Title Patient will report <3/10 pain in left hip with 30 min of standing/walking to improve shopping    Baseline 6/10 pain 15-20 min (eval); 1 hour before pain goes >3/10 02/26/20    Time 8    Period Weeks    Status Achieved      PT LONG TERM GOAL #2   Title Patient will be able to sleep on her sides for 2-3 hours without waking up from pain to improve sleeping.    Baseline unable to sleep on sides (Eval); able to sleep 1 hour on either side before needing to turn    Time 8    Period Weeks    Status On-going      PT LONG TERM GOAL #3   Title Pt will demo  5/5 strength in L hip to improve functional strnegth with functional activities.    Baseline 3+/5 grossly (eval); 5/5 grossly    Time 8    Period Weeks    Status Achieved      PT LONG TERM GOAL #4   Title LEFS goal    Baseline TBD                 Plan - 03/01/20 1546    Clinical Impression Statement Today's session was focused on overall strengthening and continued pain neuroscience education to improve tissue sensitivity.    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    Rehab Potential Good    PT Frequency 2x / week    PT Duration 8 weeks    PT Treatment/Interventions ADLs/Self Care Home Management;Cryotherapy;Moist Heat;Gait training;Stair training;Functional mobility training;Therapeutic activities;Therapeutic exercise;Balance training;Neuromuscular re-education;Manual techniques;Joint Manipulations;Spinal Manipulations;Patient/family education    PT Next Visit Plan Give patient LEFS, reviewe HEP (seated neural flossing    PT Home Exercise Plan Access Code: [email or web address]: [email or web address]: 07/16/2021Prepared by: Gwenyth Bouillon PatelExercisesProne Hip Extension - 1 x daily - 7 x weekly - 2 sets - 10 repsSidelying Hip Abduction - 1 x daily - 7 x weekly - 2 sets - 10 repsSidelying Hip Adduction - 1 x daily - 7 x weekly - 2 sets - 10 repsSingle Leg Stance - 1 x daily - 7 x weekly - 1 sets - 5 reps - 20 holdSit to Stand with Arms Crossed - 1 x daily - 7 x weekly - 2 sets - 10 reps    Consulted and Agree with Plan of Care Patient           Patient will benefit from skilled therapeutic intervention in order to  improve the following  deficits and impairments:  Decreased range of motion, Decreased strength, Increased muscle spasms, Impaired flexibility, Impaired sensation, Postural dysfunction, Pain  Visit Diagnosis: Lumbar radicular pain  Pain in left hip  Chronic bilateral low back pain with right-sided sciatica  Trochanteric bursitis of left hip     Problem List Patient Active Problem List   Diagnosis Date Noted  . Paresthesia of lower extremity 12/01/2019  . Urinary incontinence 10/24/2019  . Reactive airway disease 10/24/2019  . Thoracic outlet syndrome 10/24/2019  . Environmental and seasonal allergies 10/24/2019  . Caffeine addiction (HCC)-in remission 10/24/2019  . Hypervitaminosis B6 10/24/2019  . Morbid obesity ([place]) 10/24/2019  . Plantar fascia syndrome 10/24/2019  . Hyperlipidemia 10/24/2019  . Neuropathy- b/l upper legs-   seeing ortho for this-[facility] 10/24/2019  . Routine physical examination 12/14/2016  . Peripheral neuropathy 12/14/2016  . Overweight 09/15/2016  . Asthma 09/14/2016    Kerrie Pleasure 03/01/2020, 4:09 PM  [facility] [address] [place], [place], [address] Phone: [phone]   Fax:  [phone]  Name: Mone Commisso MRN: [id number] Date of Birth: May 07, 1966

## 2020-03-04 ENCOUNTER — Ambulatory Visit: Payer: Managed Care, Other (non HMO)

## 2020-03-08 ENCOUNTER — Ambulatory Visit: Payer: Managed Care, Other (non HMO)

## 2020-03-08 ENCOUNTER — Other Ambulatory Visit: Payer: Self-pay

## 2020-03-08 DIAGNOSIS — G8929 Other chronic pain: Secondary | ICD-10-CM

## 2020-03-08 DIAGNOSIS — M5416 Radiculopathy, lumbar region: Secondary | ICD-10-CM

## 2020-03-08 DIAGNOSIS — M25552 Pain in left hip: Secondary | ICD-10-CM

## 2020-03-08 DIAGNOSIS — M7062 Trochanteric bursitis, left hip: Secondary | ICD-10-CM

## 2020-03-08 NOTE — Therapy (Signed)
[facility] [address] [place], [place], [address] Phone: [phone]   Fax:  [phone]  Physical Therapy Treatment  Patient Details  Name: Denise Gamble MRN: [id number] Date of Birth: 1965/12/21 No data recorded  Encounter Date: 03/08/2020   PT End of Session - 03/08/20 1609    Visit Number 12    Number of Visits 17    Date for PT Re-Evaluation 03/22/20    Authorization Type [employer] (30 visit max); Eval 01/26/20    PT Start Time 1530    PT Stop Time 1615    PT Time Calculation (min) 45 min    Activity Tolerance Patient tolerated treatment well    Behavior During Therapy WFL for tasks assessed/performed           Past Medical History:  Diagnosis Date  . Allergy   . Asthma   . GERD (gastroesophageal reflux disease)   . History of MRSA infection   . Miscarriage 2013   multiple   . Paresthesia of lower extremity   . Peripheral neuropathy 2019   left foot  . Plantar fasciitis of left foot   . Thoracic outlet syndrome   . Urinary incontinence     Past Surgical History:  Procedure Laterality Date  . DILATION AND CURETTAGE OF UTERUS    . LYMPHADENECTOMY  1979    There were no vitals filed for this visit.   Subjective Assessment - 03/08/20 1536    Subjective Pt thinks burning may be less and sleeping better. She is trying not to think about    Pertinent History TOS, peripheral neuropathy, chronic LBP    Limitations Lifting;Standing;Walking;House hold activities    How long can you sit comfortably? 30 min    How long can you stand comfortably? 10 min    How long can you walk comfortably? 15-20 min    Patient Stated Goals improve pain    Pain Onset More than a month ago                treatment Supine bridge: 2 x 10 Supine bridge with hamstring curls: with ball:3 x 8 Supine deadbug with ball: 4 x 5 LE only SLS on airex: 3 x 30" R and L  Lateral step downs: 6" 2 x 10 R and L Bil leg  press: 80 lbs 2 x 10                           PT Short Term Goals - 02/26/20 1741      PT SHORT TERM GOAL #1   Title Pt will tolerate sleeping on either side for 1 hour with <2/10 pain in hip to improve frequent changing of positions during sleeping.    Baseline Can't sleep on sides due to pain (eval); 1 hours + on either side 02/26/20    Time 4    Period Weeks    Status Achieved    Target Date 02/23/20      PT SHORT TERM GOAL #2   Title Pt will demo 4/5 MMT in L hip to improve strength with transfers    Baseline 3+/5 grossly, 5/5 grossly 02/26/20    Time 4    Period Weeks    Status Achieved    Target Date 02/23/20      PT SHORT TERM GOAL #3   Title Patient will report no burning sensation below her knees with bed mobility and transfers  to improve function    Baseline burning sensation travels down to anterior shins bil with bed mobility and transfers; burning sensation upto knees mostly intermittently below knee -02/26/20    Time 4    Period Weeks    Status Achieved    Target Date 02/23/20             PT Long Term Goals - 02/26/20 1742      PT LONG TERM GOAL #1   Title Patient will report <3/10 pain in left hip with 30 min of standing/walking to improve shopping    Baseline 6/10 pain 15-20 min (eval); 1 hour before pain goes >3/10 02/26/20    Time 8    Period Weeks    Status Achieved      PT LONG TERM GOAL #2   Title Patient will be able to sleep on her sides for 2-3 hours without waking up from pain to improve sleeping.    Baseline unable to sleep on sides (Eval); able to sleep 1 hour on either side before needing to turn    Time 8    Period Weeks    Status On-going      PT LONG TERM GOAL #3   Title Pt will demo 5/5 strength in L hip to improve functional strnegth with functional activities.    Baseline 3+/5 grossly (eval); 5/5 grossly    Time 8    Period Weeks    Status Achieved      PT LONG TERM GOAL #4   Title LEFS goal    Baseline  TBD                 Plan - 03/08/20 1551    Clinical Impression Statement Patient is demonstrating improving pain with pain neuroscience education.    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    Rehab Potential Good    PT Frequency 2x / week    PT Duration 8 weeks    PT Treatment/Interventions ADLs/Self Care Home Management;Cryotherapy;Moist Heat;Gait training;Stair training;Functional mobility training;Therapeutic activities;Therapeutic exercise;Balance training;Neuromuscular re-education;Manual techniques;Joint Manipulations;Spinal Manipulations;Patient/family education    PT Next Visit Plan Give patient LEFS, reviewe HEP (seated neural flossing    PT Home Exercise Plan Access Code: [email or web address]: [email or web address]: 07/16/2021Prepared by: Gwenyth Bouillon PatelExercisesProne Hip Extension - 1 x daily - 7 x weekly - 2 sets - 10 repsSidelying Hip Abduction - 1 x daily - 7 x weekly - 2 sets - 10 repsSidelying Hip Adduction - 1 x daily - 7 x weekly - 2 sets - 10 repsSingle Leg Stance - 1 x daily - 7 x weekly - 1 sets - 5 reps - 20 holdSit to Stand with Arms Crossed - 1 x daily - 7 x weekly - 2 sets - 10 reps    Consulted and Agree with Plan of Care Patient           Patient will benefit from skilled therapeutic intervention in order to improve the following deficits and impairments:  Decreased range of motion, Decreased strength, Increased muscle spasms, Impaired flexibility, Impaired sensation, Postural dysfunction, Pain  Visit Diagnosis: Lumbar radicular pain  Pain in left hip  Chronic bilateral low back pain with right-sided  sciatica  Trochanteric bursitis of left hip     Problem List Patient Active Problem List  Diagnosis Date Noted  . Paresthesia of lower extremity 12/01/2019  . Urinary incontinence 10/24/2019  . Reactive airway disease 10/24/2019  . Thoracic outlet syndrome 10/24/2019  . Environmental and seasonal allergies 10/24/2019  . Caffeine addiction (HCC)-in remission 10/24/2019  . Hypervitaminosis B6 10/24/2019  . Morbid obesity ([place]) 10/24/2019  . Plantar fascia syndrome 10/24/2019  . Hyperlipidemia 10/24/2019  . Neuropathy- b/l upper legs-   seeing ortho for this-[facility] 10/24/2019  . Routine physical examination 12/14/2016  . Peripheral neuropathy 12/14/2016  . Overweight 09/15/2016  . Asthma 09/14/2016    Kerrie Pleasure, PT 03/08/2020, 4:10 PM  [facility] [address] [place], [place], [address] Phone: [phone]   Fax:  [phone]  Name: Denise Gamble MRN: [id number] Date of Birth: 25-Oct-1965

## 2020-03-11 ENCOUNTER — Other Ambulatory Visit: Payer: Self-pay

## 2020-03-11 ENCOUNTER — Ambulatory Visit: Payer: Managed Care, Other (non HMO) | Attending: Diagnostic Neuroimaging

## 2020-03-11 DIAGNOSIS — M5441 Lumbago with sciatica, right side: Secondary | ICD-10-CM | POA: Diagnosis present

## 2020-03-11 DIAGNOSIS — M7062 Trochanteric bursitis, left hip: Secondary | ICD-10-CM | POA: Insufficient documentation

## 2020-03-11 DIAGNOSIS — G8929 Other chronic pain: Secondary | ICD-10-CM | POA: Diagnosis present

## 2020-03-11 DIAGNOSIS — M5416 Radiculopathy, lumbar region: Secondary | ICD-10-CM | POA: Diagnosis present

## 2020-03-11 DIAGNOSIS — M25552 Pain in left hip: Secondary | ICD-10-CM | POA: Insufficient documentation

## 2020-03-11 NOTE — Therapy (Signed)
[facility] [address] [place], [place], [address] Phone: [phone]   Fax:  [phone]  Physical Therapy Treatment  Patient Details  Name: Denise Gamble MRN: [id number] Date of Birth: 1965/08/28 No data recorded  Encounter Date: 03/11/2020   PT End of Session - 03/11/20 1820    Visit Number 13    Number of Visits 17    Date for PT Re-Evaluation 03/22/20    Authorization Type [employer] (30 visit max); Eval 01/26/20    PT Start Time 47   Pt late 25'   PT Stop Time 1625    PT Time Calculation (min) 150 min           Past Medical History:  Diagnosis Date  . Allergy   . Asthma   . GERD (gastroesophageal reflux disease)   . History of MRSA infection   . Miscarriage 2013   multiple   . Paresthesia of lower extremity   . Peripheral neuropathy 2019   left foot  . Plantar fasciitis of left foot   . Thoracic outlet syndrome   . Urinary incontinence     Past Surgical History:  Procedure Laterality Date  . DILATION AND CURETTAGE OF UTERUS    . LYMPHADENECTOMY  1979    There were no vitals filed for this visit.   Subjective Assessment - 03/11/20 1818    Subjective Pt reports she has been working a lot so she feels more sore. Overall, she is trying not to think about pain. Burning is still happening but i is not going into her calf and burning in lateral thigh is improved but now she is feeling burning in inner R thigh.    Pertinent History TOS, peripheral neuropathy, chronic LBP    Limitations Lifting;Standing;Walking;House hold activities    How long can you sit comfortably? 30 min    How long can you stand comfortably? 10 min    How long can you walk comfortably? 15-20 min    Patient Stated Goals improve pain    Pain Onset More than a month ago               Manual therapy: IT band foam rollers to bil IT bands. Performed active release with pressure hold as patient flexes and extends the knee to  actively release tissues of IT band. Pain neuroscience education with above interventions. Discussed that change in burning may be just because things are changing. She is feeling less burning in calves and lateral thighs which is positive. She is able to do more things on her feet which is good. Feeling stiff after sitting for some time is normal and shouldn't be thoght of as significant issue.                        PT Short Term Goals - 02/26/20 1741      PT SHORT TERM GOAL #1   Title Pt will tolerate sleeping on either side for 1 hour with <2/10 pain in hip to improve frequent changing of positions during sleeping.    Baseline Can't sleep on sides due to pain (eval); 1 hours + on either side 02/26/20    Time 4    Period Weeks    Status Achieved    Target Date 02/23/20      PT SHORT TERM GOAL #2   Title Pt will demo 4/5 MMT in L hip to improve strength with transfers  Baseline 3+/5 grossly, 5/5 grossly 02/26/20    Time 4    Period Weeks    Status Achieved    Target Date 02/23/20      PT SHORT TERM GOAL #3   Title Patient will report no burning sensation below her knees with bed mobility and transfers to improve function    Baseline burning sensation travels down to anterior shins bil with bed mobility and transfers; burning sensation upto knees mostly intermittently below knee -02/26/20    Time 4    Period Weeks    Status Achieved    Target Date 02/23/20             PT Long Term Goals - 02/26/20 1742      PT LONG TERM GOAL #1   Title Patient will report <3/10 pain in left hip with 30 min of standing/walking to improve shopping    Baseline 6/10 pain 15-20 min (eval); 1 hour before pain goes >3/10 02/26/20    Time 8    Period Weeks    Status Achieved      PT LONG TERM GOAL #2   Title Patient will be able to sleep on her sides for 2-3 hours without waking up from pain to improve sleeping.    Baseline unable to sleep on sides (Eval); able to sleep 1 hour  on either side before needing to turn    Time 8    Period Weeks    Status On-going      PT LONG TERM GOAL #3   Title Pt will demo 5/5 strength in L hip to improve functional strnegth with functional activities.    Baseline 3+/5 grossly (eval); 5/5 grossly    Time 8    Period Weeks    Status Achieved      PT LONG TERM GOAL #4   Title LEFS goal    Baseline TBD                 Plan - 03/11/20 1819    Clinical Impression Statement Patient reported improved flexibility in her thighs after today's session. patient is overall responding well with pain neuroscience education and reporting improving pain and function.    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    Rehab Potential Good    PT Frequency 2x / week    PT Duration 8 weeks    PT Treatment/Interventions ADLs/Self Care Home Management;Cryotherapy;Moist Heat;Gait training;Stair training;Functional mobility training;Therapeutic activities;Therapeutic exercise;Balance training;Neuromuscular re-education;Manual techniques;Joint Manipulations;Spinal Manipulations;Patient/family education    PT Next Visit Plan Give patient LEFS, reviewe HEP (seated neural flossing    PT Home Exercise Plan Access Code: [email or web address]: [email or web address]: 07/16/2021Prepared by: Gwenyth Bouillon PatelExercisesProne Hip Extension - 1 x daily - 7 x weekly - 2 sets - 10 repsSidelying Hip Abduction - 1 x daily - 7 x weekly - 2 sets - 10 repsSidelying Hip Adduction - 1 x daily - 7 x weekly - 2 sets - 10 repsSingle Leg Stance - 1 x daily - 7 x weekly - 1 sets - 5 reps - 20 holdSit to Stand with Arms Crossed - 1 x daily -  7 x weekly - 2 sets - 10 reps    Consulted and Agree with Plan of Care Patient  Patient will benefit from skilled therapeutic intervention in order to improve the following deficits and impairments:  Decreased range of motion, Decreased strength, Increased muscle spasms, Impaired flexibility, Impaired sensation, Postural dysfunction, Pain  Visit Diagnosis: Lumbar radicular pain  Pain in left hip  Chronic bilateral low back pain with right-sided sciatica  Trochanteric bursitis of left hip     Problem List Patient Active Problem List   Diagnosis Date Noted  . Paresthesia of lower extremity 12/01/2019  . Urinary incontinence 10/24/2019  . Reactive airway disease 10/24/2019  . Thoracic outlet syndrome 10/24/2019  . Environmental and seasonal allergies 10/24/2019  . Caffeine addiction (HCC)-in remission 10/24/2019  . Hypervitaminosis B6 10/24/2019  . Morbid obesity ([facility]) 10/24/2019  . Plantar fascia syndrome 10/24/2019  . Hyperlipidemia 10/24/2019  . Neuropathy- b/l upper legs-   seeing ortho for this-[facility] 10/24/2019  . Routine physical examination 12/14/2016  . Peripheral neuropathy 12/14/2016  . Overweight 09/15/2016  . Asthma 09/14/2016    Kerrie Pleasure 03/11/2020, 6:24 PM  [facility] [address] [place], [place], [address] Phone: [phone]   Fax:  [phone]  Name: Denise Gamble MRN: [id number] Date of Birth: [birth year]

## 2020-03-12 ENCOUNTER — Telehealth: Payer: Self-pay

## 2020-03-12 NOTE — Telephone Encounter (Signed)
-----   Message from Ramonita Lab sent at 03/12/2020  8:34 AM EDT ----- Patient had two referrals placed within a month of each other, one from [facility] that they closed as a duplicate and one from [facility] that they did contact the patient several times to sch. I reopened [facility] referral and contacted patient (I got her VM) and left her their contact info as they did accept the referral. Told patient if she had any issues to contact me here at the office. ----- Message ----- From: Fonnie Mu, CMA Sent: 03/08/2020  12:28 PM EDT To: Lucienne Minks [place]  Pt was previously referred to [facility] for colonoscopy.  Can you please f/u on this referral?  I do not see any notes where the pt was contacted for scheduling with them.  Thanks!

## 2020-03-12 NOTE — Telephone Encounter (Signed)
Noted.  T. Damontay Alred, CMA 

## 2020-03-15 ENCOUNTER — Ambulatory Visit: Payer: Managed Care, Other (non HMO)

## 2020-03-15 ENCOUNTER — Other Ambulatory Visit: Payer: Self-pay

## 2020-03-15 DIAGNOSIS — M5416 Radiculopathy, lumbar region: Secondary | ICD-10-CM | POA: Diagnosis not present

## 2020-03-15 DIAGNOSIS — M25552 Pain in left hip: Secondary | ICD-10-CM

## 2020-03-15 DIAGNOSIS — M5441 Lumbago with sciatica, right side: Secondary | ICD-10-CM

## 2020-03-15 DIAGNOSIS — M7062 Trochanteric bursitis, left hip: Secondary | ICD-10-CM

## 2020-03-15 NOTE — Therapy (Signed)
[facility] [address] [place], [place], [address] Phone: [phone]   Fax:  [phone]  Physical Therapy Treatment  Patient Details  Name: Denise Gamble MRN: [id number] Date of Birth: 1966-06-21 No data recorded  Encounter Date: 03/15/2020   PT End of Session - 03/15/20 2256    Visit Number 14    Number of Visits 17    Date for PT Re-Evaluation 03/22/20    Authorization Type [employer] (30 visit max); Eval 01/26/20    PT Start Time 1530    PT Stop Time 1615    PT Time Calculation (min) 45 min           Past Medical History:  Diagnosis Date  . Allergy   . Asthma   . GERD (gastroesophageal reflux disease)   . History of MRSA infection   . Miscarriage 2013   multiple   . Paresthesia of lower extremity   . Peripheral neuropathy 2019   left foot  . Plantar fasciitis of left foot   . Thoracic outlet syndrome   . Urinary incontinence     Past Surgical History:  Procedure Laterality Date  . DILATION AND CURETTAGE OF UTERUS    . LYMPHADENECTOMY  1979    There were no vitals filed for this visit.   Subjective Assessment - 03/15/20 2254    Subjective My steps are quicker now. I have been able to sleep better at night for last few days. My burning may be better in my legs.    Pertinent History TOS, peripheral neuropathy, chronic LBP    Limitations Lifting;Standing;Walking;House hold activities    How long can you sit comfortably? 30 min    How long can you stand comfortably? 10 min    How long can you walk comfortably? 15-20 min    Patient Stated Goals improve pain    Pain Onset More than a month ago                Foam roll massage to bil IT band with active release using knee flexion and extension  Prone bent knee hip extensions: 2 x 10 cues for proper weight shifting  Pt educated and given resources on different types of rolling carts to carry multiple bags that she has to carry at work.    Demo how to use massage stick at home to improve soft tissue mobility                       PT Short Term Goals - 02/26/20 1741      PT SHORT TERM GOAL #1   Title Pt will tolerate sleeping on either side for 1 hour with <2/10 pain in hip to improve frequent changing of positions during sleeping.    Baseline Can't sleep on sides due to pain (eval); 1 hours + on either side 02/26/20    Time 4    Period Weeks    Status Achieved    Target Date 02/23/20      PT SHORT TERM GOAL #2   Title Pt will demo 4/5 MMT in L hip to improve strength with transfers    Baseline 3+/5 grossly, 5/5 grossly 02/26/20    Time 4    Period Weeks    Status Achieved    Target Date 02/23/20      PT SHORT TERM GOAL #3   Title Patient will report no burning sensation below her knees with bed mobility  and transfers to improve function    Baseline burning sensation travels down to anterior shins bil with bed mobility and transfers; burning sensation upto knees mostly intermittently below knee -02/26/20    Time 4    Period Weeks    Status Achieved    Target Date 02/23/20             PT Long Term Goals - 02/26/20 1742      PT LONG TERM GOAL #1   Title Patient will report <3/10 pain in left hip with 30 min of standing/walking to improve shopping    Baseline 6/10 pain 15-20 min (eval); 1 hour before pain goes >3/10 02/26/20    Time 8    Period Weeks    Status Achieved      PT LONG TERM GOAL #2   Title Patient will be able to sleep on her sides for 2-3 hours without waking up from pain to improve sleeping.    Baseline unable to sleep on sides (Eval); able to sleep 1 hour on either side before needing to turn    Time 8    Period Weeks    Status On-going      PT LONG TERM GOAL #3   Title Pt will demo 5/5 strength in L hip to improve functional strnegth with functional activities.    Baseline 3+/5 grossly (eval); 5/5 grossly    Time 8    Period Weeks    Status Achieved      PT LONG  TERM GOAL #4   Title LEFS goal    Baseline TBD                 Plan - 03/15/20 2254    Clinical Impression Statement Today's session was focused on improving soft tissue mobility through bil IT bands, improivng glut strength and educating patient on how to carry multiple bags at work using a rolling dolly/truck .    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    Rehab Potential Good    PT Frequency 2x / week    PT Duration 8 weeks    PT Treatment/Interventions ADLs/Self Care Home Management;Cryotherapy;Moist Heat;Gait training;Stair training;Functional mobility training;Therapeutic activities;Therapeutic exercise;Balance training;Neuromuscular re-education;Manual techniques;Joint Manipulations;Spinal Manipulations;Patient/family education    PT Next Visit Plan Give patient LEFS, reviewe HEP (seated neural flossing    PT Home Exercise Plan Access Code: [email or web address]: [email or web address]: 07/16/2021Prepared by: Gwenyth Bouillon PatelExercisesProne Hip Extension - 1 x daily - 7 x weekly - 2 sets - 10 repsSidelying Hip Abduction - 1 x daily - 7 x weekly - 2 sets - 10 repsSidelying Hip Adduction - 1 x daily - 7 x weekly - 2 sets - 10 repsSingle Leg Stance - 1 x daily - 7 x weekly - 1 sets - 5 reps - 20 holdSit to Stand with Arms Crossed - 1 x daily - 7 x weekly - 2 sets - 10 reps    Consulted and Agree with Plan of Care Patient           Patient will benefit from skilled therapeutic intervention in order to improve the following deficits and impairments:  Decreased range of motion, Decreased strength, Increased muscle spasms, Impaired flexibility,  Impaired sensation, Postural dysfunction, Pain  Visit Diagnosis: Lumbar radicular pain  Pain in left hip  Chronic bilateral low back pain with right-sided sciatica  Trochanteric bursitis of left hip     Problem List Patient Active Problem List   Diagnosis Date Noted  . Paresthesia of lower extremity 12/01/2019  . Urinary incontinence 10/24/2019  . Reactive airway disease 10/24/2019  . Thoracic outlet syndrome 10/24/2019  . Environmental and seasonal allergies 10/24/2019  . Caffeine addiction (HCC)-in remission 10/24/2019  . Hypervitaminosis B6 10/24/2019  . Morbid obesity ([place]) 10/24/2019  . Plantar fascia syndrome 10/24/2019  . Hyperlipidemia 10/24/2019  . Neuropathy- b/l upper legs-   seeing ortho for this-[facility] 10/24/2019  . Routine physical examination 12/14/2016  . Peripheral neuropathy 12/14/2016  . Overweight 09/15/2016  . Asthma 09/14/2016    Kerrie Pleasure 03/15/2020, 10:57 PM  [facility] [address] [place], [place], [address] Phone: [phone]   Fax:  [phone]  Name: Denise Gamble MRN: [id number] Date of Birth: 1965-11-23

## 2020-04-01 ENCOUNTER — Other Ambulatory Visit: Payer: Self-pay

## 2020-04-01 ENCOUNTER — Ambulatory Visit: Payer: Managed Care, Other (non HMO)

## 2020-04-01 DIAGNOSIS — M5416 Radiculopathy, lumbar region: Secondary | ICD-10-CM | POA: Diagnosis not present

## 2020-04-01 DIAGNOSIS — M25552 Pain in left hip: Secondary | ICD-10-CM

## 2020-04-01 DIAGNOSIS — M7062 Trochanteric bursitis, left hip: Secondary | ICD-10-CM

## 2020-04-01 DIAGNOSIS — M5441 Lumbago with sciatica, right side: Secondary | ICD-10-CM

## 2020-04-01 NOTE — Therapy (Signed)
[facility] [address] [place], [place], [address] Phone: [phone]   Fax:  [phone]  Physical Therapy Therapy Re-certification  Patient Details  Name: Denise Gamble MRN: [id number] Date of Birth: 15-Sep-1965 No data recorded  Encounter Date: 04/01/2020   PT End of Session - 04/01/20 1849    Visit Number 15    Number of Visits 21    Date for PT Re-Evaluation 05/13/20    Authorization Type [employer] (30 visit max); Eval 01/26/20; Recert 6/96/29 (6 sessions)    PT Start Time 1535    PT Stop Time 1615    PT Time Calculation (min) 40 min           Past Medical History:  Diagnosis Date  . Allergy   . Asthma   . GERD (gastroesophageal reflux disease)   . History of MRSA infection   . Miscarriage 2013   multiple   . Paresthesia of lower extremity   . Peripheral neuropathy 2019   left foot  . Plantar fasciitis of left foot   . Thoracic outlet syndrome   . Urinary incontinence     Past Surgical History:  Procedure Laterality Date  . DILATION AND CURETTAGE OF UTERUS    . LYMPHADENECTOMY  1979    There were no vitals filed for this visit.   Subjective Assessment - 04/01/20 1847    Subjective I feel I am 25% better overall. Starting back work has been stressful and I feel my pain is worst at end of the work day compared to weekednds.    Pertinent History TOS, peripheral neuropathy, chronic LBP    Limitations Lifting;Standing;Walking;House hold activities    How long can you sit comfortably? 30 min    How long can you stand comfortably? 10 min    How long can you walk comfortably? 15-20 min    Patient Stated Goals improve pain    Pain Onset More than a month ago              [facility] PT Assessment - 04/01/20 0001      ROM / Strength   AROM / PROM / Strength Strength      Strength   Overall Strength --    Strength Assessment Site Hip;Knee    Right/Left Hip Right;Left    Right Hip Flexion 5/5     Right Hip Extension 4/5    Right Hip ABduction 5/5    Left Hip Flexion 4+/5    Left Hip Extension 4/5    Left Hip ABduction 4/5    Right/Left Knee Right;Left    Right Knee Flexion 5/5    Right Knee Extension 5/5    Left Knee Flexion 5/5    Left Knee Extension 5/5                                   PT Short Term Goals - 04/01/20 1847      PT SHORT TERM GOAL #1   Title Pt will tolerate sleeping on either side for 1 hour with <2/10 pain in hip to improve frequent changing of positions during sleeping.    Baseline Can't sleep on sides due to pain (eval); 1 hours + on either side 02/26/20    Time 4    Period Weeks    Status Achieved    Target Date 02/23/20      PT SHORT TERM  GOAL #2   Title Pt will demo 4/5 MMT in L hip to improve strength with transfers    Baseline 3+/5 grossly, 5/5 grossly 02/26/20    Time 4    Period Weeks    Status Achieved    Target Date 02/23/20      PT SHORT TERM GOAL #3   Title Patient will report no burning sensation below her knees with bed mobility and transfers to improve function    Baseline burning sensation travels down to anterior shins bil with bed mobility and transfers; burning sensation upto knees mostly intermittently below knee -02/26/20    Time 4    Period Weeks    Status Achieved    Target Date 02/23/20             PT Long Term Goals - 04/01/20 1847      PT LONG TERM GOAL #1   Title Patient will report <3/10 pain in left hip with 30 min of standing/walking to improve shopping    Baseline 6/10 pain 15-20 min (eval); 1 hour before pain goes >3/10 02/26/20    Time 8    Period Weeks    Status Achieved      PT LONG TERM GOAL #2   Title Patient will be able to sleep on her sides for 2-3 hours without waking up from pain to improve sleeping.    Baseline unable to sleep on sides (Eval); able to sleep 1 hour on either side before needing to turn; Patient still reports waking up 2-3x per night due to pain    Time 6     Period Weeks    Status On-going    Target Date 05/13/20      PT LONG TERM GOAL #3   Title Pt will demo 5/5 strength in L hip to improve functional strnegth with functional activities.    Baseline 3+/5 grossly (eval); 5/5 grossly    Time 8    Period Weeks    Status Achieved      PT LONG TERM GOAL #4   Title Patient will report 50% improvement in symptoms of burning sensation in her legs with bed mobility and crawling    Baseline 25% (04/01/20)    Time 6    Period Weeks    Status Revised    Target Date 05/13/20                 Plan - 04/01/20 1849    Clinical Impression Statement Patient has been seen for total of 15 sessions from 01/26/20 to 04/01/20 for bil hip pain, lumbar radiculopathy and chronic pain. Patient is subjectively reporting 25% improvement in her burning sensation in bil proximal thighs. Patient had not been seen since 8.6.21 and in that time she had decreased her compliance with home exercise program which had resulted in slight increase in her pain symptoms. Patient is demonstrating mildly decreased strength in L hip compared to R hip. Patient is reporting benefits of pain neuroscinece education as she is demonstrating improving function with decreasing pain levels. Patient reports that pain is still distubring her sleeping 2-3x/night. patient has met all of her short term goals and making progress towards long term goals. Patient will benefit from skilled PT for once a week for 5-6 more sessions to continue to improve pain neuroscince education to manage her pain and symptoms, modify home exercise program and manual therapy to improve soft tissue restirctions to improve pain.    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    Rehab Potential Good    PT Frequency 1x / week    PT Duration 6 weeks    PT Treatment/Interventions ADLs/Self Care Home Management;Cryotherapy;Moist Heat;Gait training;Stair training;Functional mobility training;Therapeutic activities;Therapeutic exercise;Balance training;Neuromuscular re-education;Manual techniques;Joint Manipulations;Spinal Manipulations;Patient/family education    PT Next Visit Plan Give patient LEFS, reviewe HEP (seated neural flossing    PT Home Exercise Plan Access Code: [email or web address]: [email or web address]: 07/16/2021Prepared by: Gwenyth Bouillon PatelExercisesProne Hip Extension - 1 x daily - 7 x weekly - 2 sets - 10 repsSidelying Hip Abduction - 1 x daily - 7 x weekly - 2 sets - 10 repsSidelying Hip Adduction - 1 x daily - 7 x weekly - 2 sets - 10 repsSingle Leg Stance - 1 x daily - 7 x weekly - 1 sets - 5 reps - 20 holdSit to Stand with Arms Crossed - 1 x daily - 7 x weekly - 2 sets - 10 reps    Consulted and Agree with Plan of Care Patient           Patient will benefit from skilled therapeutic intervention in order to improve the following deficits and impairments:  Decreased range of motion, Decreased strength, Increased muscle spasms, Impaired flexibility, Impaired sensation, Postural dysfunction, Pain  Visit Diagnosis: Lumbar radicular pain  Pain in left hip  Chronic bilateral low back pain with right-sided sciatica  Trochanteric bursitis of left hip     Problem List Patient Active Problem List   Diagnosis Date Noted  . Paresthesia of lower extremity 12/01/2019  . Urinary incontinence 10/24/2019  . Reactive airway disease 10/24/2019  . Thoracic outlet syndrome 10/24/2019  . Environmental and seasonal allergies 10/24/2019  . Caffeine addiction (HCC)-in remission 10/24/2019  . Hypervitaminosis B6 10/24/2019  . Morbid obesity ([place])  10/24/2019  . Plantar fascia syndrome 10/24/2019  . Hyperlipidemia 10/24/2019  . Neuropathy- b/l upper legs-   seeing ortho for this-[facility] 10/24/2019  . Routine physical examination 12/14/2016  . Peripheral neuropathy 12/14/2016  . Overweight 09/15/2016  . Asthma 09/14/2016    Kerrie Pleasure, PT 04/01/2020, 6:57 PM  [facility] [address] [place], [place], [address] Phone: [phone]   Fax:  [phone]  Name: Denise Gamble MRN: [id number] Date of Birth: 08-26-1965

## 2020-04-08 ENCOUNTER — Ambulatory Visit: Payer: Managed Care, Other (non HMO)

## 2020-04-08 ENCOUNTER — Other Ambulatory Visit: Payer: Self-pay

## 2020-04-08 DIAGNOSIS — M5441 Lumbago with sciatica, right side: Secondary | ICD-10-CM

## 2020-04-08 DIAGNOSIS — M5416 Radiculopathy, lumbar region: Secondary | ICD-10-CM | POA: Diagnosis not present

## 2020-04-08 DIAGNOSIS — M7062 Trochanteric bursitis, left hip: Secondary | ICD-10-CM

## 2020-04-08 DIAGNOSIS — M25552 Pain in left hip: Secondary | ICD-10-CM

## 2020-04-08 NOTE — Therapy (Signed)
[facility] [address] [place], [place], [address] Phone: [phone]   Fax:  [phone]  Physical Therapy Treatment  Patient Details  Name: Denise Gamble MRN: [id number] Date of Birth: July 06, 1966 No data recorded  Encounter Date: 04/08/2020   PT End of Session - 04/08/20 1556    Visit Number 16    Number of Visits 21    Date for PT Re-Evaluation 05/13/20    Authorization Type [employer] (30 visit max); Eval 01/26/20; Recert 9/35/70 (6 sessions)    PT Start Time 1536    PT Stop Time 1615    PT Time Calculation (min) 39 min    Activity Tolerance Patient tolerated treatment well    Behavior During Therapy WFL for tasks assessed/performed           Past Medical History:  Diagnosis Date  . Allergy   . Asthma   . GERD (gastroesophageal reflux disease)   . History of MRSA infection   . Miscarriage 2013   multiple   . Paresthesia of lower extremity   . Peripheral neuropathy 2019   left foot  . Plantar fasciitis of left foot   . Thoracic outlet syndrome   . Urinary incontinence     Past Surgical History:  Procedure Laterality Date  . DILATION AND CURETTAGE OF UTERUS    . LYMPHADENECTOMY  1979    There were no vitals filed for this visit.   Subjective Assessment - 04/08/20 1553    Subjective Symptoms are the same.    Pertinent History TOS, peripheral neuropathy, chronic LBP    Limitations Lifting;Standing;Walking;House hold activities    How long can you sit comfortably? 30 min    How long can you stand comfortably? 10 min    How long can you walk comfortably? 15-20 min    Patient Stated Goals improve pain                   Standing to unilateral WB with knee on mat table: 3 x 10 R and L Q-ped leg press: 3 x 10 R and L Prone hip extensions: 3 x 10 R and L   Nerve desensitization by rubbing towel on anterior, medial, lateral and posterior aspect of L leg upto mid thigh: 1' bouts for up to  15'                       PT Short Term Goals - 04/01/20 1847      PT SHORT TERM GOAL #1   Title Pt will tolerate sleeping on either side for 1 hour with <2/10 pain in hip to improve frequent changing of positions during sleeping.    Baseline Can't sleep on sides due to pain (eval); 1 hours + on either side 02/26/20    Time 4    Period Weeks    Status Achieved    Target Date 02/23/20      PT SHORT TERM GOAL #2   Title Pt will demo 4/5 MMT in L hip to improve strength with transfers    Baseline 3+/5 grossly, 5/5 grossly 02/26/20    Time 4    Period Weeks    Status Achieved    Target Date 02/23/20      PT SHORT TERM GOAL #3   Title Patient will report no burning sensation below her knees with bed mobility and transfers to improve function    Baseline burning sensation travels down to  anterior shins bil with bed mobility and transfers; burning sensation upto knees mostly intermittently below knee -02/26/20    Time 4    Period Weeks    Status Achieved    Target Date 02/23/20             PT Long Term Goals - 04/01/20 1847      PT LONG TERM GOAL #1   Title Patient will report <3/10 pain in left hip with 30 min of standing/walking to improve shopping    Baseline 6/10 pain 15-20 min (eval); 1 hour before pain goes >3/10 02/26/20    Time 8    Period Weeks    Status Achieved      PT LONG TERM GOAL #2   Title Patient will be able to sleep on her sides for 2-3 hours without waking up from pain to improve sleeping.    Baseline unable to sleep on sides (Eval); able to sleep 1 hour on either side before needing to turn; Patient still reports waking up 2-3x per night due to pain    Time 6    Period Weeks    Status On-going    Target Date 05/13/20      PT LONG TERM GOAL #3   Title Pt will demo 5/5 strength in L hip to improve functional strnegth with functional activities.    Baseline 3+/5 grossly (eval); 5/5 grossly    Time 8    Period Weeks    Status Achieved       PT LONG TERM GOAL #4   Title Patient will report 50% improvement in symptoms of burning sensation in her legs with bed mobility and crawling    Baseline 25% (04/01/20)    Time 6    Period Weeks    Status Revised    Target Date 05/13/20                 Plan - 04/08/20 1554    Clinical Impression Statement Today's skilled session was focused on working on desensitization of her radicular symptoms by putting WB through LE in different positions. Pt demonstrated significant improvement with desensitization with towel today as patient was asked to WB through her knee after desensitization and she didn't feel burning sensation in L LE but she did in R LE which was not desensitized during the session today by rubbing towel.    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    Rehab Potential Good    PT Frequency 1x / week    PT Duration 6 weeks    PT Treatment/Interventions ADLs/Self Care Home Management;Cryotherapy;Moist Heat;Gait training;Stair training;Functional mobility training;Therapeutic activities;Therapeutic exercise;Balance training;Neuromuscular re-education;Manual techniques;Joint Manipulations;Spinal Manipulations;Patient/family education    PT Next Visit Plan Give patient LEFS, reviewe HEP (seated neural flossing    PT Home Exercise Plan Access Code: [email or web address]: [email or web address]: 07/16/2021Prepared by: Gwenyth Bouillon PatelExercisesProne Hip Extension - 1 x daily - 7 x weekly - 2 sets - 10 repsSidelying Hip Abduction - 1 x daily - 7 x weekly - 2 sets - 10 repsSidelying Hip Adduction - 1 x daily - 7 x weekly - 2 sets - 10 repsSingle  Leg Stance - 1 x daily - 7 x weekly - 1 sets - 5 reps - 20 holdSit to Stand with Arms Crossed - 1 x  daily - 7 x weekly - 2 sets - 10 reps    Consulted and Agree with Plan of Care Patient           Patient will benefit from skilled therapeutic intervention in order to improve the following deficits and impairments:  Decreased range of motion, Decreased strength, Increased muscle spasms, Impaired flexibility, Impaired sensation, Postural dysfunction, Pain  Visit Diagnosis: Lumbar radicular pain  Pain in left hip  Chronic bilateral low back pain with right-sided sciatica  Trochanteric bursitis of left hip     Problem List Patient Active Problem List   Diagnosis Date Noted  . Paresthesia of lower extremity 12/01/2019  . Urinary incontinence 10/24/2019  . Reactive airway disease 10/24/2019  . Thoracic outlet syndrome 10/24/2019  . Environmental and seasonal allergies 10/24/2019  . Caffeine addiction (HCC)-in remission 10/24/2019  . Hypervitaminosis B6 10/24/2019  . Morbid obesity ([place]) 10/24/2019  . Plantar fascia syndrome 10/24/2019  . Hyperlipidemia 10/24/2019  . Neuropathy- b/l upper legs-   seeing ortho for this-[facility] 10/24/2019  . Routine physical examination 12/14/2016  . Peripheral neuropathy 12/14/2016  . Overweight 09/15/2016  . Asthma 09/14/2016    Kerrie Pleasure, PT 04/08/2020, 5:10 PM  [facility] [address] [place], [place], [address] Phone: [phone]   Fax:  [phone]  Name: Denise Gamble MRN: [id number] Date of Birth: Oct 07, 1965

## 2020-04-19 ENCOUNTER — Ambulatory Visit: Payer: Managed Care, Other (non HMO) | Attending: Diagnostic Neuroimaging

## 2020-04-19 ENCOUNTER — Other Ambulatory Visit: Payer: Self-pay

## 2020-04-19 DIAGNOSIS — M5416 Radiculopathy, lumbar region: Secondary | ICD-10-CM | POA: Diagnosis not present

## 2020-04-19 DIAGNOSIS — M7062 Trochanteric bursitis, left hip: Secondary | ICD-10-CM | POA: Diagnosis present

## 2020-04-19 DIAGNOSIS — M25552 Pain in left hip: Secondary | ICD-10-CM | POA: Diagnosis present

## 2020-04-19 DIAGNOSIS — M5441 Lumbago with sciatica, right side: Secondary | ICD-10-CM | POA: Diagnosis present

## 2020-04-19 DIAGNOSIS — G8929 Other chronic pain: Secondary | ICD-10-CM | POA: Diagnosis present

## 2020-04-19 NOTE — Therapy (Signed)
[facility] [address] [place], [place], [address] Phone: [phone]   Fax:  [phone]  Physical Therapy Treatment  Patient Details  Name: Denise Gamble MRN: [id number] Date of Birth: 1966/04/17 No data recorded  Encounter Date: 04/19/2020   PT End of Session - 04/19/20 1559    Visit Number 17    Number of Visits 21    Date for PT Re-Evaluation 05/13/20    Authorization Type [employer] (30 visit max); Eval 01/26/20; Recert 0/07/12 (6 sessions)    PT Start Time 1530    PT Stop Time 1615    PT Time Calculation (min) 45 min    Activity Tolerance Patient tolerated treatment well    Behavior During Therapy WFL for tasks assessed/performed           Past Medical History:  Diagnosis Date  . Allergy   . Asthma   . GERD (gastroesophageal reflux disease)   . History of MRSA infection   . Miscarriage 2013   multiple   . Paresthesia of lower extremity   . Peripheral neuropathy 2019   left foot  . Plantar fasciitis of left foot   . Thoracic outlet syndrome   . Urinary incontinence     Past Surgical History:  Procedure Laterality Date  . DILATION AND CURETTAGE OF UTERUS    . LYMPHADENECTOMY  1979    There were no vitals filed for this visit.   Subjective Assessment - 04/19/20 1531    Subjective Symptoms are the same. She did not notice significant improvement after desensiziation last session. Last 3 days has been rough for hips even though she hasn't been to work in last 3 days. Currently R hip is more session.    Pertinent History TOS, peripheral neuropathy, chronic LBP    Limitations Lifting;Standing;Walking;House hold activities    How long can you sit comfortably? 30 min    How long can you stand comfortably? 10 min    How long can you walk comfortably? 15-20 min    Patient Stated Goals improve pain    Pain Score 4     Pain Location Hip    Pain Orientation Right    Pain Descriptors / Indicators Aching     Pain Type Chronic pain                    Seated AROM hip internal and external rotations: 10x  Seated manually resisted hip internal and external rotations: 2 x 10 R and L Prone manually resisted hip internal and external rotations: 2 x 10 R only  STM to posterior aspect of TFL and glut attachment to posterior aspect of trochanter Grade IV lumbar PA mobilization: l1-3  Lateral step ups: 4" box 2 x 10 PNE: educated patient on how tissue sensitivity gets affected in chronic pain and how exercise, PNE, proper sleep can help reduce pain sensitivity so she will experience less pain over time.                    PT Short Term Goals - 04/01/20 1847      PT SHORT TERM GOAL #1   Title Pt will tolerate sleeping on either side for 1 hour with <2/10 pain in hip to improve frequent changing of positions during sleeping.    Baseline Can't sleep on sides due to pain (eval); 1 hours + on either side 02/26/20    Time 4    Period Weeks  Status Achieved    Target Date 02/23/20      PT SHORT TERM GOAL #2   Title Pt will demo 4/5 MMT in L hip to improve strength with transfers    Baseline 3+/5 grossly, 5/5 grossly 02/26/20    Time 4    Period Weeks    Status Achieved    Target Date 02/23/20      PT SHORT TERM GOAL #3   Title Patient will report no burning sensation below her knees with bed mobility and transfers to improve function    Baseline burning sensation travels down to anterior shins bil with bed mobility and transfers; burning sensation upto knees mostly intermittently below knee -02/26/20    Time 4    Period Weeks    Status Achieved    Target Date 02/23/20             PT Long Term Goals - 04/01/20 1847      PT LONG TERM GOAL #1   Title Patient will report <3/10 pain in left hip with 30 min of standing/walking to improve shopping    Baseline 6/10 pain 15-20 min (eval); 1 hour before pain goes >3/10 02/26/20    Time 8    Period Weeks    Status  Achieved      PT LONG TERM GOAL #2   Title Patient will be able to sleep on her sides for 2-3 hours without waking up from pain to improve sleeping.    Baseline unable to sleep on sides (Eval); able to sleep 1 hour on either side before needing to turn; Patient still reports waking up 2-3x per night due to pain    Time 6    Period Weeks    Status On-going    Target Date 05/13/20      PT LONG TERM GOAL #3   Title Pt will demo 5/5 strength in L hip to improve functional strnegth with functional activities.    Baseline 3+/5 grossly (eval); 5/5 grossly    Time 8    Period Weeks    Status Achieved      PT LONG TERM GOAL #4   Title Patient will report 50% improvement in symptoms of burning sensation in her legs with bed mobility and crawling    Baseline 25% (04/01/20)    Time 6    Period Weeks    Status Revised    Target Date 05/13/20                 Plan - 04/19/20 1558    Clinical Impression Statement No significant change with desensitization from last session. Patient reported pain decreased to 20% at end of the session.    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    Rehab Potential Good    PT Frequency 1x / week    PT Duration 6 weeks    PT Treatment/Interventions ADLs/Self Care Home Management;Cryotherapy;Moist Heat;Gait training;Stair training;Functional mobility training;Therapeutic activities;Therapeutic exercise;Balance training;Neuromuscular re-education;Manual techniques;Joint Manipulations;Spinal Manipulations;Patient/family education    PT Next Visit Plan Give patient LEFS, reviewe HEP (seated neural flossing    PT Home Exercise  Plan Access Code: [email or web address]: [email or web address]: 07/16/2021Prepared by: Gwenyth Bouillon PatelExercisesProne Hip Extension - 1 x daily - 7 x weekly - 2 sets - 10 repsSidelying Hip Abduction - 1  x daily - 7 x weekly - 2 sets - 10 repsSidelying Hip Adduction - 1 x daily - 7 x weekly - 2 sets - 10 repsSingle Leg Stance - 1 x daily - 7 x weekly - 1 sets - 5 reps - 20 holdSit to Stand with Arms Crossed - 1 x daily - 7 x weekly - 2 sets - 10 reps    Consulted and Agree with Plan of Care Patient           Patient will benefit from skilled therapeutic intervention in order to improve the following deficits and impairments:  Decreased range of motion, Decreased strength, Increased muscle spasms, Impaired flexibility, Impaired sensation, Postural dysfunction, Pain  Visit Diagnosis: Lumbar radicular pain  Pain in left hip  Chronic bilateral low back pain with right-sided sciatica  Trochanteric bursitis of left hip     Problem List Patient Active Problem List   Diagnosis Date Noted  . Paresthesia of lower extremity 12/01/2019  . Urinary incontinence 10/24/2019  . Reactive airway disease 10/24/2019  . Thoracic outlet syndrome 10/24/2019  . Environmental and seasonal allergies 10/24/2019  . Caffeine addiction (HCC)-in remission 10/24/2019  . Hypervitaminosis B6 10/24/2019  . Morbid obesity ([place]) 10/24/2019  . Plantar fascia syndrome 10/24/2019  . Hyperlipidemia 10/24/2019  . Neuropathy- b/l upper legs-   seeing ortho for this-[facility] 10/24/2019  . Routine physical examination 12/14/2016  . Peripheral neuropathy 12/14/2016  . Overweight 09/15/2016  . Asthma 09/14/2016    Kerrie Pleasure, PT 04/19/2020, 4:21 PM  [facility] [address] [place], [place], [address] Phone: [phone]   Fax:  [phone]  Name: Denise Gamble MRN: [id number] Date of Birth: 1966/02/12

## 2020-04-22 ENCOUNTER — Ambulatory Visit: Payer: Managed Care, Other (non HMO)

## 2020-04-24 ENCOUNTER — Other Ambulatory Visit: Payer: Self-pay

## 2020-04-24 ENCOUNTER — Ambulatory Visit: Payer: Managed Care, Other (non HMO)

## 2020-04-24 DIAGNOSIS — M5416 Radiculopathy, lumbar region: Secondary | ICD-10-CM

## 2020-04-24 DIAGNOSIS — G8929 Other chronic pain: Secondary | ICD-10-CM

## 2020-04-24 DIAGNOSIS — M25552 Pain in left hip: Secondary | ICD-10-CM

## 2020-04-24 DIAGNOSIS — M7062 Trochanteric bursitis, left hip: Secondary | ICD-10-CM

## 2020-04-24 NOTE — Therapy (Signed)
[facility] [address] [place], [place], [address] Phone: [phone]   Fax:  [phone]  Physical Therapy Treatment  Patient Details  Name: Denise Gamble MRN: [id number] Date of Birth: [birth year] No data recorded  Encounter Date: 04/24/2020   PT End of Session - 04/24/20 1621    Visit Number 18    Number of Visits 21    Date for PT Re-Evaluation 05/13/20    Authorization Type [employer] (30 visit max); Eval 01/26/20; Recert 04/24/04 (6 sessions)    PT Start Time 1545    PT Stop Time 1615    PT Time Calculation (min) 30 min    Activity Tolerance Patient tolerated treatment well    Behavior During Therapy WFL for tasks assessed/performed           Past Medical History:  Diagnosis Date   Allergy    Asthma    GERD (gastroesophageal reflux disease)    History of MRSA infection    Miscarriage 2013   multiple    Paresthesia of lower extremity    Peripheral neuropathy 2019   left foot   Plantar fasciitis of left foot    Thoracic outlet syndrome    Urinary incontinence     Past Surgical History:  Procedure Laterality Date   DILATION AND CURETTAGE OF UTERUS     LYMPHADENECTOMY  1979    There were no vitals filed for this visit.   Subjective Assessment - 04/24/20 1620    Subjective Symptoms are up and down.    Pertinent History TOS, peripheral neuropathy, chronic LBP    Limitations Lifting;Standing;Walking;House hold activities    How long can you sit comfortably? 30 min    How long can you stand comfortably? 10 min    How long can you walk comfortably? 15-20 min    Patient Stated Goals improve pain                  Pt educated on tissue healing inchronic state. Pt educated that at this point tissue have maximally healed and her pain is due to heightened sensitivity of nervous system. Pt educated on focusing on function by working through pain of no more than 3/10.  Prone grade IV PA  in lumbar spine Myofascial release to R sacral sulcus Prone manually resisted hip internal rotation and erectile dysfunction: 3 x 10 bil with slow reversals and quick stretch to facilitate mm contraction  Pt educated on doing seated hip internal rotation and erectile dysfunction during the day to restore mm control in bil hips. AROM only.                      PT Short Term Goals - 04/01/20 1847      PT SHORT TERM GOAL #1   Title Pt will tolerate sleeping on either side for 1 hour with <2/10 pain in hip to improve frequent changing of positions during sleeping.    Baseline Can't sleep on sides due to pain (eval); 1 hours + on either side 02/26/20    Time 4    Period Weeks    Status Achieved    Target Date 02/23/20      PT SHORT TERM GOAL #2   Title Pt will demo 4/5 MMT in L hip to improve strength with transfers    Baseline 3+/5 grossly, 5/5 grossly 02/26/20    Time 4    Period Weeks    Status Achieved  Target Date 02/23/20      PT SHORT TERM GOAL #3   Title Patient will report no burning sensation below her knees with bed mobility and transfers to improve function    Baseline burning sensation travels down to anterior shins bil with bed mobility and transfers; burning sensation upto knees mostly intermittently below knee -02/26/20    Time 4    Period Weeks    Status Achieved    Target Date 02/23/20             PT Long Term Goals - 04/01/20 1847      PT LONG TERM GOAL #1   Title Patient will report <3/10 pain in left hip with 30 min of standing/walking to improve shopping    Baseline 6/10 pain 15-20 min (eval); 1 hour before pain goes >3/10 02/26/20    Time 8    Period Weeks    Status Achieved      PT LONG TERM GOAL #2   Title Patient will be able to sleep on her sides for 2-3 hours without waking up from pain to improve sleeping.    Baseline unable to sleep on sides (Eval); able to sleep 1 hour on either side before needing to turn; Patient still  reports waking up 2-3x per night due to pain    Time 6    Period Weeks    Status On-going    Target Date 05/13/20      PT LONG TERM GOAL #3   Title Pt will demo 5/5 strength in L hip to improve functional strnegth with functional activities.    Baseline 3+/5 grossly (eval); 5/5 grossly    Time 8    Period Weeks    Status Achieved      PT LONG TERM GOAL #4   Title Patient will report 50% improvement in symptoms of burning sensation in her legs with bed mobility and crawling    Baseline 25% (04/01/20)    Time 6    Period Weeks    Status Revised    Target Date 05/13/20                 Plan - 04/24/20 1620    Clinical Impression Statement Pt continues to be hyperfocused on the "cause" of her pain. Patient was educated to de-focus her attention from "tissue pain" to more on improving function by managing pain <3/10. Internal rotators in bil hips were weaker.    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    Rehab Potential Good    PT Frequency 1x / week    PT Duration 6 weeks    PT Treatment/Interventions ADLs/Self Care Home Management;Cryotherapy;Moist Heat;Gait training;Stair training;Functional mobility training;Therapeutic activities;Therapeutic exercise;Balance training;Neuromuscular re-education;Manual techniques;Joint Manipulations;Spinal Manipulations;Patient/family education    PT Next Visit Plan Give patient LEFS, reviewe HEP (seated neural flossing    PT Home Exercise Plan Access Code: [email or web address]: [email or web address]: 07/16/2021Prepared by: Gwenyth Bouillon PatelExercisesProne Hip Extension - 1 x daily - 7 x weekly - 2 sets - 10  repsSidelying Hip Abduction - 1 x daily - 7 x weekly - 2 sets - 10 repsSidelying Hip Adduction - 1 x daily - 7 x weekly - 2 sets - 10 repsSingle Leg Stance - 1 x daily - 7 x weekly - 1 sets - 5  reps - 20 holdSit to Stand with Arms Crossed - 1 x daily - 7 x weekly - 2 sets - 10 reps    Consulted and Agree with Plan of Care Patient           Patient will benefit from skilled therapeutic intervention in order to improve the following deficits and impairments:  Decreased range of motion, Decreased strength, Increased muscle spasms, Impaired flexibility, Impaired sensation, Postural dysfunction, Pain  Visit Diagnosis: Pain in left hip  Lumbar radicular pain  Chronic bilateral low back pain with right-sided sciatica  Trochanteric bursitis of left hip     Problem List Patient Active Problem List   Diagnosis Date Noted   Paresthesia of lower extremity 12/01/2019   Urinary incontinence 10/24/2019   Reactive airway disease 10/24/2019   Thoracic outlet syndrome 10/24/2019   Environmental and seasonal allergies 10/24/2019   Caffeine addiction (HCC)-in remission 10/24/2019   Hypervitaminosis B6 10/24/2019   Morbid obesity ([place]) 10/24/2019   Plantar fascia syndrome 10/24/2019   Hyperlipidemia 10/24/2019   Neuropathy- b/l upper legs-   seeing ortho for this-[facility] 10/24/2019   Routine physical examination 12/14/2016   Peripheral neuropathy 12/14/2016   Overweight 09/15/2016   Asthma 09/14/2016    Kerrie Pleasure, PT 04/24/2020, 4:24 PM  [facility] [address] [place], [place], [address] Phone: [phone]   Fax:  [phone]  Name: Denise Gamble MRN: [id number] Date of Birth: [birth year]

## 2020-04-29 ENCOUNTER — Ambulatory Visit: Payer: Managed Care, Other (non HMO)

## 2020-04-29 ENCOUNTER — Other Ambulatory Visit: Payer: Self-pay

## 2020-04-29 DIAGNOSIS — M5416 Radiculopathy, lumbar region: Secondary | ICD-10-CM

## 2020-04-29 DIAGNOSIS — G8929 Other chronic pain: Secondary | ICD-10-CM

## 2020-04-29 DIAGNOSIS — M25552 Pain in left hip: Secondary | ICD-10-CM

## 2020-04-29 DIAGNOSIS — M7062 Trochanteric bursitis, left hip: Secondary | ICD-10-CM

## 2020-04-29 NOTE — Therapy (Signed)
[facility] [address] [place], [place], [address] Phone: [phone]   Fax:  [phone]  Physical Therapy Treatment  Patient Details  Name: Denise Gamble MRN: [id number] Date of Birth: 12-20-1965 No data recorded  Encounter Date: 04/29/2020   PT End of Session - 04/29/20 1603    Visit Number 19    Number of Visits 21    Date for PT Re-Evaluation 05/13/20    Authorization Type [employer] (30 visit max); Eval 01/26/20; Recert 10/28/20 (6 sessions)    PT Start Time 1545    PT Stop Time 1615    PT Time Calculation (min) 30 min    Equipment Utilized During Treatment Gait belt    Activity Tolerance Patient tolerated treatment well    Behavior During Therapy WFL for tasks assessed/performed           Past Medical History:  Diagnosis Date  . Allergy   . Asthma   . GERD (gastroesophageal reflux disease)   . History of MRSA infection   . Miscarriage 2013   multiple   . Paresthesia of lower extremity   . Peripheral neuropathy 2019   left foot  . Plantar fasciitis of left foot   . Thoracic outlet syndrome   . Urinary incontinence     Past Surgical History:  Procedure Laterality Date  . DILATION AND CURETTAGE OF UTERUS    . LYMPHADENECTOMY  1979    There were no vitals filed for this visit.   Subjective Assessment - 04/29/20 1554    Subjective I am really impressed with how much I am able to do compared to before. Yesterday, I spent lot of hours working in the yard. when i was done, I was little sore but I tried to "convince" my body (nerves) that yes, I worked and it should be sore but not painful. I feel little sore but not as bad as I would have guessed. Sleeping at night it still bothersome as she aches all over when trying to go to sleep.    Pertinent History TOS, peripheral neuropathy, chronic LBP    Limitations Lifting;Standing;Walking;House hold activities    How long can you sit comfortably? 30 min     How long can you stand comfortably? 10 min    How long can you walk comfortably? 15-20 min    Patient Stated Goals improve pain            Treatment: Discussed and affirmed patient that she did the right thing by actively thinking about whether or not if pain is proportional to her activities over the weekend.  We focused on meditation strategies to locate area of pain and to reduce the pain Pt lying in supine hooklying position. Pt focuses on spot of pain, visualizes perception of pain by comparing it to size of the fruit. Then she takes deep breath in, takes a bite of fruit (pain) and then exhales as she brings it out of body. 20' working on different body parts that felt discomfort or pain    Then patient was lying in supine with legs extended: tried to focus on most painful spot and tried to reduce it with above meditation strategy. 5'                         PT Short Term Goals - 04/01/20 1847      PT SHORT TERM GOAL #1   Title Pt will tolerate sleeping  on either side for 1 hour with <2/10 pain in hip to improve frequent changing of positions during sleeping.    Baseline Can't sleep on sides due to pain (eval); 1 hours + on either side 02/26/20    Time 4    Period Weeks    Status Achieved    Target Date 02/23/20      PT SHORT TERM GOAL #2   Title Pt will demo 4/5 MMT in L hip to improve strength with transfers    Baseline 3+/5 grossly, 5/5 grossly 02/26/20    Time 4    Period Weeks    Status Achieved    Target Date 02/23/20      PT SHORT TERM GOAL #3   Title Patient will report no burning sensation below her knees with bed mobility and transfers to improve function    Baseline burning sensation travels down to anterior shins bil with bed mobility and transfers; burning sensation upto knees mostly intermittently below knee -02/26/20    Time 4    Period Weeks    Status Achieved    Target Date 02/23/20             PT Long Term Goals - 04/01/20  1847      PT LONG TERM GOAL #1   Title Patient will report <3/10 pain in left hip with 30 min of standing/walking to improve shopping    Baseline 6/10 pain 15-20 min (eval); 1 hour before pain goes >3/10 02/26/20    Time 8    Period Weeks    Status Achieved      PT LONG TERM GOAL #2   Title Patient will be able to sleep on her sides for 2-3 hours without waking up from pain to improve sleeping.    Baseline unable to sleep on sides (Eval); able to sleep 1 hour on either side before needing to turn; Patient still reports waking up 2-3x per night due to pain    Time 6    Period Weeks    Status On-going    Target Date 05/13/20      PT LONG TERM GOAL #3   Title Pt will demo 5/5 strength in L hip to improve functional strnegth with functional activities.    Baseline 3+/5 grossly (eval); 5/5 grossly    Time 8    Period Weeks    Status Achieved      PT LONG TERM GOAL #4   Title Patient will report 50% improvement in symptoms of burning sensation in her legs with bed mobility and crawling    Baseline 25% (04/01/20)    Time 6    Period Weeks    Status Revised    Target Date 05/13/20                 Plan - 04/29/20 1555    Clinical Impression Statement Today's skilled session was focused on continued education on pain management strategies to improve function and sleeping habits. Patient is verbalizing understanding and willing to practice mindfull ness and activity based approach to manage his pain.    Personal Factors and Comorbidities Comorbidity 2    Comorbidities LBP, L hip pain, L plantar fasciitis    Examination-Activity Limitations Bed Mobility;Carry;Sleep;Squat;Stairs;Stand;Transfers;Toileting    Examination-Participation Restrictions Church;Cleaning;Community Activity;Driving;Laundry;Meal Prep;Shop;Yard Work    Stability/Clinical Decision Making Stable/Uncomplicated    Rehab Potential Good    PT Frequency 1x / week    PT Duration 6 weeks    PT Treatment/Interventions  ADLs/Self Care Home Management;Cryotherapy;Moist Heat;Gait training;Stair training;Functional mobility training;Therapeutic activities;Therapeutic exercise;Balance training;Neuromuscular re-education;Manual techniques;Joint Manipulations;Spinal Manipulations;Patient/family education    PT Next Visit Plan Give patient LEFS, reviewe HEP (seated neural flossing    PT Home Exercise Plan Access Code: [email or web address]: [email or web address]: 07/16/2021Prepared by: Gwenyth Bouillon PatelExercisesProne Hip Extension - 1 x daily - 7 x weekly - 2 sets - 10 repsSidelying Hip Abduction - 1 x daily - 7 x weekly - 2 sets - 10 repsSidelying Hip Adduction - 1 x daily - 7 x weekly - 2 sets - 10 repsSingle Leg Stance - 1 x daily - 7 x weekly - 1 sets - 5 reps - 20 holdSit to Stand with Arms Crossed - 1 x daily - 7 x weekly - 2 sets - 10 reps    Consulted and Agree with Plan of Care Patient           Patient will benefit from skilled therapeutic intervention in order to improve the following deficits and impairments:  Decreased range of motion, Decreased strength, Increased muscle spasms, Impaired flexibility, Impaired sensation, Postural dysfunction, Pain  Visit Diagnosis: Pain in left hip  Lumbar radicular pain  Chronic bilateral low back pain with right-sided sciatica  Trochanteric bursitis of left hip     Problem List Patient Active Problem List   Diagnosis Date Noted  . Paresthesia of lower extremity 12/01/2019  . Urinary incontinence 10/24/2019  . Reactive airway disease 10/24/2019  . Thoracic outlet syndrome 10/24/2019  . Environmental and seasonal allergies 10/24/2019  . Caffeine addiction (HCC)-in remission 10/24/2019  . Hypervitaminosis B6 10/24/2019  . Morbid obesity ([place]) 10/24/2019  . Plantar fascia syndrome 10/24/2019  . Hyperlipidemia 10/24/2019  . Neuropathy- b/l upper legs-   seeing ortho for this-[facility] 10/24/2019  . Routine physical examination 12/14/2016  . Peripheral  neuropathy 12/14/2016  . Overweight 09/15/2016  . Asthma 09/14/2016    Kerrie Pleasure 04/29/2020, 4:13 PM  [facility] [address] [place], [place], [address] Phone: [phone]   Fax:  [phone]  Name: Denise Gamble MRN: [id number] Date of Birth: 1966-05-14

## 2020-05-06 ENCOUNTER — Other Ambulatory Visit: Payer: Self-pay

## 2020-05-06 ENCOUNTER — Ambulatory Visit: Payer: Managed Care, Other (non HMO)

## 2020-05-06 DIAGNOSIS — G8929 Other chronic pain: Secondary | ICD-10-CM

## 2020-05-06 DIAGNOSIS — M25552 Pain in left hip: Secondary | ICD-10-CM

## 2020-05-06 DIAGNOSIS — M7062 Trochanteric bursitis, left hip: Secondary | ICD-10-CM

## 2020-05-06 DIAGNOSIS — M5416 Radiculopathy, lumbar region: Secondary | ICD-10-CM

## 2020-05-06 NOTE — Therapy (Signed)
[facility] [address] [place], [place], [address] Phone: [phone]   Fax:  [phone]  Physical Therapy Treatment  Patient Details  Name: Denise Gamble MRN: [id number] Date of Birth: [birth year] No data recorded  Encounter Date: 05/06/2020   PT End of Session - 05/06/20 1554    Visit Number 20    Number of Visits 21    Date for PT Re-Evaluation 05/13/20    Authorization Type [employer] (30 visit max); Eval 01/26/20; Recert 9/52/84 (6 sessions)    PT Start Time 1545    PT Stop Time 1615    PT Time Calculation (min) 30 min    Equipment Utilized During Treatment Gait belt    Activity Tolerance Patient tolerated treatment well    Behavior During Therapy WFL for tasks assessed/performed           Past Medical History:  Diagnosis Date  . Allergy   . Asthma   . GERD (gastroesophageal reflux disease)   . History of MRSA infection   . Miscarriage 2013   multiple   . Paresthesia of lower extremity   . Peripheral neuropathy 2019   left foot  . Plantar fasciitis of left foot   . Thoracic outlet syndrome   . Urinary incontinence     Past Surgical History:  Procedure Laterality Date  . DILATION AND CURETTAGE OF UTERUS    . LYMPHADENECTOMY  1979    There were no vitals filed for this visit.   Subjective Assessment - 05/06/20 1544    Subjective I had more pain on Saturday in my lower back but I was fine on Sunday. I did lot of yard work on Sunday. My back is feeling better. I am able to cross my ankle over my knee so much easier.                cues to maintain lumbar spine and pelvis with following stretches - knee to chest: 3 x 30" R and L - piriformis stretch: 3 x 30" R and L - Figure 4: 1 x 30" R and L - 90/90 hamstring stretch: 5 x 10" R and L Lower trunk rotation with knee glides to self mobilize lumbar spine: 20x each side. Press up: 5x                       PT Short Term  Goals - 04/01/20 1847      PT SHORT TERM GOAL #1   Title Pt will tolerate sleeping on either side for 1 hour with <2/10 pain in hip to improve frequent changing of positions during sleeping.    Baseline Can't sleep on sides due to pain (eval); 1 hours + on either side 02/26/20    Time 4    Period Weeks    Status Achieved    Target Date 02/23/20      PT SHORT TERM GOAL #2   Title Pt will demo 4/5 MMT in L hip to improve strength with transfers    Baseline 3+/5 grossly, 5/5 grossly 02/26/20    Time 4    Period Weeks    Status Achieved    Target Date 02/23/20      PT SHORT TERM GOAL #3   Title Patient will report no burning sensation below her knees with bed mobility and transfers to improve function    Baseline burning sensation travels down to anterior shins bil with bed mobility and transfers;  burning sensation upto knees mostly intermittently below knee -02/26/20    Time 4    Period Weeks    Status Achieved    Target Date 02/23/20             PT Long Term Goals - 04/01/20 1847      PT LONG TERM GOAL #1   Title Patient will report <3/10 pain in left hip with 30 min of standing/walking to improve shopping    Baseline 6/10 pain 15-20 min (eval); 1 hour before pain goes >3/10 02/26/20    Time 8    Period Weeks    Status Achieved      PT LONG TERM GOAL #2   Title Patient will be able to sleep on her sides for 2-3 hours without waking up from pain to improve sleeping.    Baseline unable to sleep on sides (Eval); able to sleep 1 hour on either side before needing to turn; Patient still reports waking up 2-3x per night due to pain    Time 6    Period Weeks    Status On-going    Target Date 05/13/20      PT LONG TERM GOAL #3   Title Pt will demo 5/5 strength in L hip to improve functional strnegth with functional activities.    Baseline 3+/5 grossly (eval); 5/5 grossly    Time 8    Period Weeks    Status Achieved      PT LONG TERM GOAL #4   Title Patient will report 50%  improvement in symptoms of burning sensation in her legs with bed mobility and crawling    Baseline 25% (04/01/20)    Time 6    Period Weeks    Status Revised    Target Date 05/13/20                 Plan - 05/06/20 1554    Clinical Impression Statement today's session was focused on maintaining neutral lumbar spine pelvis positioning while performing stretches to improve stretching. Pt does have increased sensitivity over R>L greater trochanter. patient was educated on getting evaluated by ortho for potential cortisone injection. Pt demo decreased sensitivity over R greater trochanter after stretching today.    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    Rehab Potential Good    PT Frequency 1x / week    PT Duration 6 weeks    PT Treatment/Interventions ADLs/Self Care Home Management;Cryotherapy;Moist Heat;Gait training;Stair training;Functional mobility training;Therapeutic activities;Therapeutic exercise;Balance training;Neuromuscular re-education;Manual techniques;Joint Manipulations;Spinal Manipulations;Patient/family education    PT Next Visit Plan Give patient LEFS, reviewe HEP (seated neural flossing    PT Home Exercise Plan Access Code: [email or web address]: [email or web address]: 07/16/2021Prepared by: Gwenyth Bouillon PatelExercisesProne Hip Extension - 1 x daily - 7 x weekly - 2 sets - 10 repsSidelying Hip Abduction - 1 x daily - 7 x weekly - 2 sets - 10 repsSidelying Hip Adduction - 1 x daily - 7 x weekly - 2 sets - 10 repsSingle Leg Stance - 1 x daily - 7 x weekly - 1 sets - 5 reps - 20 holdSit to Stand with Arms Crossed - 1 x daily - 7 x weekly  - 2 sets - 10 reps    Consulted and Agree with Plan of Care Patient  Patient will benefit from skilled therapeutic intervention in order to improve the following deficits and impairments:  Decreased range of motion, Decreased strength, Increased muscle spasms, Impaired flexibility, Impaired sensation, Postural dysfunction, Pain  Visit Diagnosis: Pain in left hip  Lumbar radicular pain  Chronic bilateral low back pain with right-sided sciatica  Trochanteric bursitis of left hip     Problem List Patient Active Problem List   Diagnosis Date Noted  . Paresthesia of lower extremity 12/01/2019  . Urinary incontinence 10/24/2019  . Reactive airway disease 10/24/2019  . Thoracic outlet syndrome 10/24/2019  . Environmental and seasonal allergies 10/24/2019  . Caffeine addiction (HCC)-in remission 10/24/2019  . Hypervitaminosis B6 10/24/2019  . Morbid obesity ([place]) 10/24/2019  . Plantar fascia syndrome 10/24/2019  . Hyperlipidemia 10/24/2019  . Neuropathy- b/l upper legs-   seeing ortho for this-[facility] 10/24/2019  . Routine physical examination 12/14/2016  . Peripheral neuropathy 12/14/2016  . Overweight 09/15/2016  . Asthma 09/14/2016    Kerrie Pleasure, PT 05/06/2020, 4:15 PM  [facility] [address] [place], [place], [address] Phone: [phone]   Fax:  [phone]  Name: Emerly Prak MRN: [id number] Date of Birth: January 12, 1966

## 2020-05-07 IMAGING — CR DG CHEST 2V
2 series · 2 of 2 positions shown · non-contrast
Comparison: 09/23/2018

CLINICAL DATA: Pt reports that she was seen last week and diagnosed
with the flu. After following up with her doctor, she was sent to
the [REDACTED] for a follow up x-ray [REDACTED] and was told that
she had pneumonia. Pt states that she is having chest pain that
radiates down her sternum and into her right side at this time. Pt
also complains of abdominal tenderness.

EXAM:
CHEST - 2 VIEW

[chest pa]
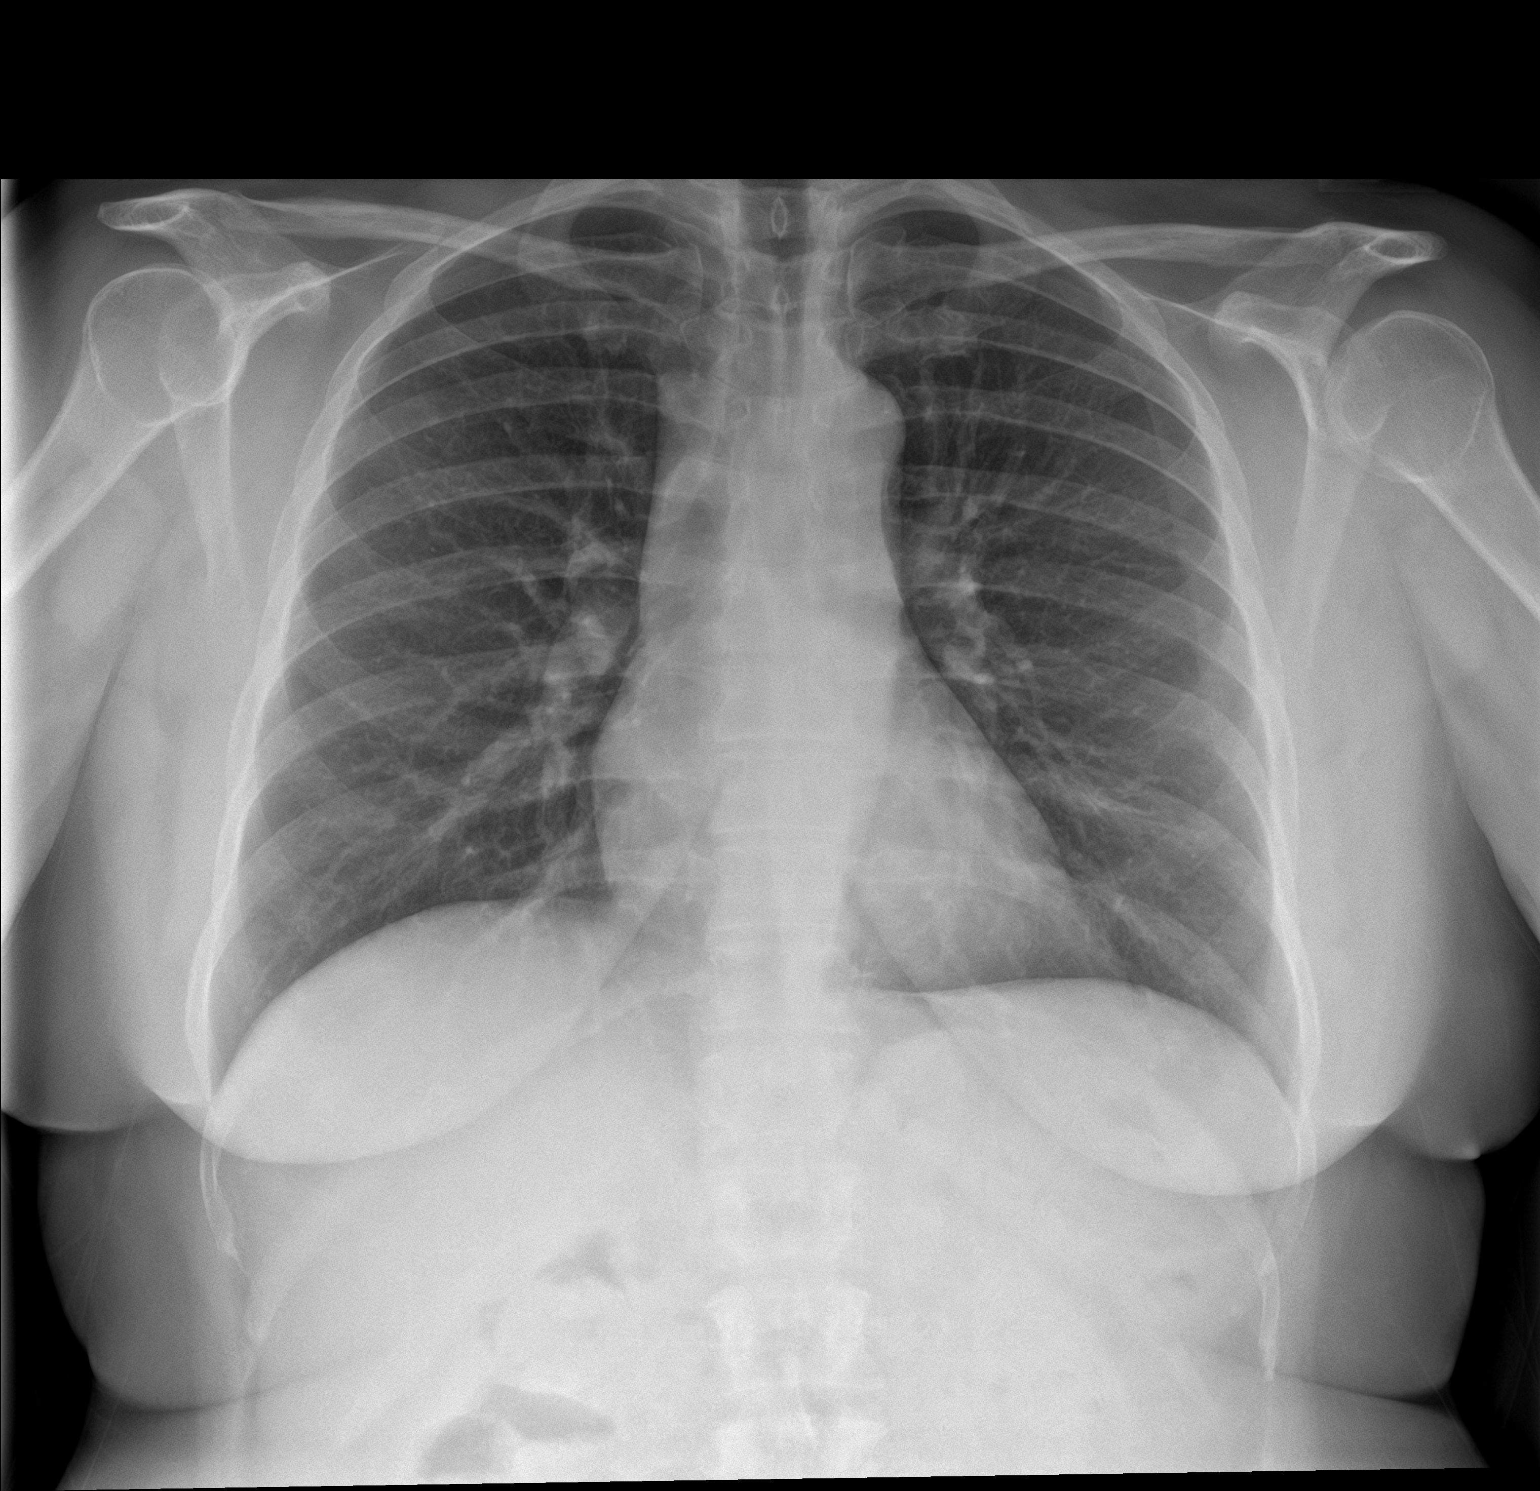

[chest lat]
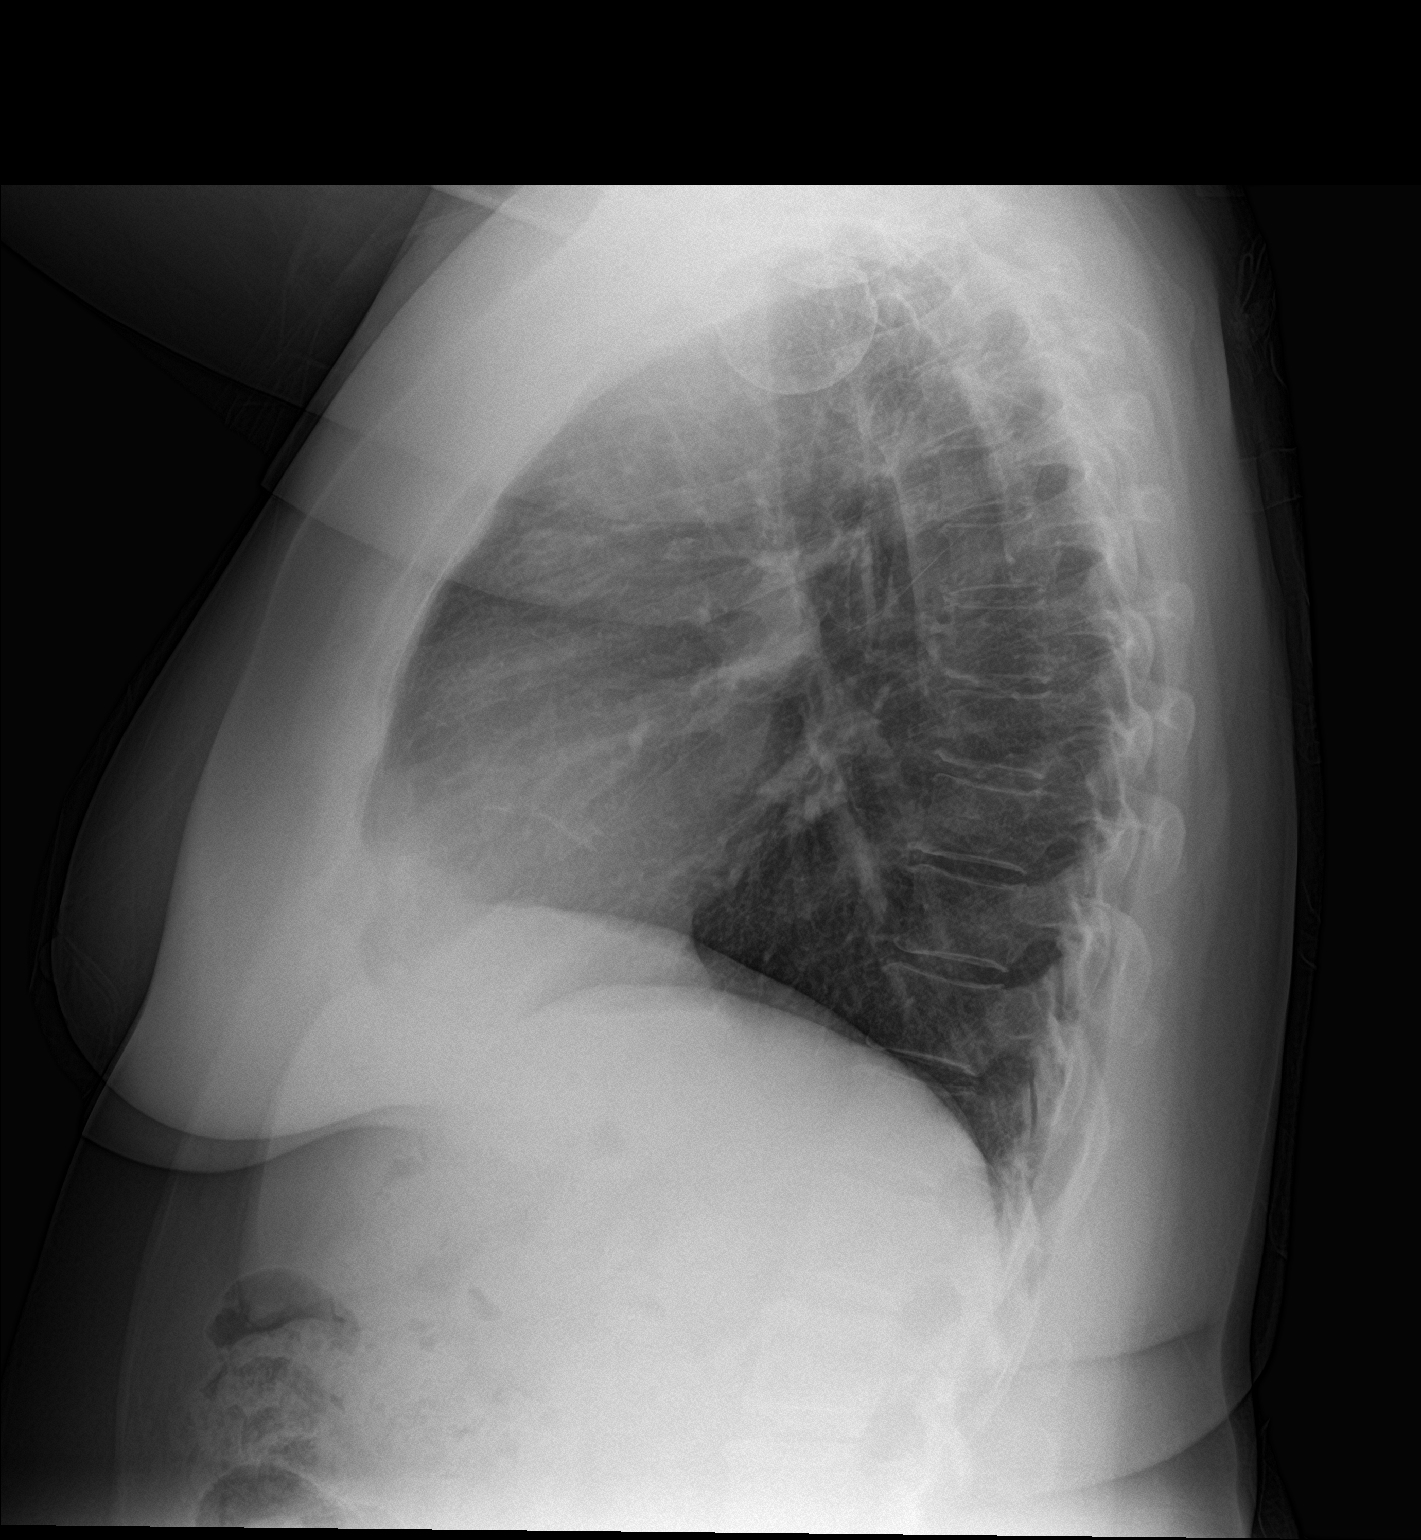

[2 of 2 positions shown; findings below may reference images not displayed]

FINDINGS: Cardiac silhouette is normal in size and configuration. Normal
mediastinal and hilar contours.

Clear lungs.  No pleural effusion or pneumothorax.

Skeletal structures are unremarkable.
IMPRESSION: Normal chest radiographs.

## 2020-05-16 ENCOUNTER — Other Ambulatory Visit: Payer: Self-pay

## 2020-05-16 ENCOUNTER — Ambulatory Visit: Payer: Managed Care, Other (non HMO) | Attending: Diagnostic Neuroimaging

## 2020-05-16 ENCOUNTER — Encounter: Payer: Self-pay | Admitting: Internal Medicine

## 2020-05-16 DIAGNOSIS — M5416 Radiculopathy, lumbar region: Secondary | ICD-10-CM

## 2020-05-16 DIAGNOSIS — M25552 Pain in left hip: Secondary | ICD-10-CM | POA: Diagnosis not present

## 2020-05-16 DIAGNOSIS — G8929 Other chronic pain: Secondary | ICD-10-CM | POA: Diagnosis present

## 2020-05-16 DIAGNOSIS — M5441 Lumbago with sciatica, right side: Secondary | ICD-10-CM | POA: Insufficient documentation

## 2020-05-16 DIAGNOSIS — M7062 Trochanteric bursitis, left hip: Secondary | ICD-10-CM | POA: Diagnosis present

## 2020-05-16 NOTE — Therapy (Signed)
[facility] [address] [place], [place], [address] Phone: [phone]   Fax:  [phone]  Physical Therapy Discharge Note Patient Details  Name: Denise Gamble MRN: [id number] Date of Birth: 07-18-1966 No data recorded  Encounter Date: 05/16/2020   PT End of Session - 05/16/20 1812    Visit Number 21    Number of Visits 21    Date for PT Re-Evaluation 05/13/20    Authorization Type [employer] (30 visit max); Eval 01/26/20; Recert 6/62/94 (6 sessions)    PT Start Time 1700    PT Stop Time 1745    PT Time Calculation (min) 45 min    Equipment Utilized During Treatment Gait belt    Activity Tolerance Patient tolerated treatment well    Behavior During Therapy WFL for tasks assessed/performed           Past Medical History:  Diagnosis Date  . Allergy   . Asthma   . GERD (gastroesophageal reflux disease)   . History of MRSA infection   . Miscarriage 2013   multiple   . Paresthesia of lower extremity   . Peripheral neuropathy 2019   left foot  . Plantar fasciitis of left foot   . Thoracic outlet syndrome   . Urinary incontinence     Past Surgical History:  Procedure Laterality Date  . DILATION AND CURETTAGE OF UTERUS    . LYMPHADENECTOMY  1979    There were no vitals filed for this visit.   Subjective Assessment - 05/16/20 1810    Subjective Pt reports she has been more cognicient of Pain neuroscience education (PNE) and trying her best to implement it.                Pain neuroscince education: Pt reports of increased pain when she is lying in bed randomly: pt educated that sometimes environment can trigger pain, try sleeping in different bed and change which beds she sleeps in to see if frequent changing helps with automatic neuropathic pain We reviewed Tissue sensitivity and how it increases and how tissue tolerance decreaseds with prolonged pain. When she feels intense pain or burning, she was  educated to try out practicing her understanding of PNE to see if she should be feeling that much pain without making significant movement or tissue stress. Pt educated on tissue healting and how her pain is outside her tissue healing and how it affects her increased tissue sensitivity Pt educated on continue to find exercises and movements, activities that do not exaggerate pain and that makes her feel better for positive reinforcement. Pt educated that what helped last week may not help this week and continue to explore different exercises (from HEP) and movements and modification of activities to continue to find "happy medium".  Emphasized not "avoiding" activities but learning how to modify them to make them more tolerable.                       PT Short Term Goals - 04/01/20 1847      PT SHORT TERM GOAL #1   Title Pt will tolerate sleeping on either side for 1 hour with <2/10 pain in hip to improve frequent changing of positions during sleeping.    Baseline Can't sleep on sides due to pain (eval); 1 hours + on either side 02/26/20    Time 4    Period Weeks    Status Achieved    Target Date 02/23/20  PT SHORT TERM GOAL #2   Title Pt will demo 4/5 MMT in L hip to improve strength with transfers    Baseline 3+/5 grossly, 5/5 grossly 02/26/20    Time 4    Period Weeks    Status Achieved    Target Date 02/23/20      PT SHORT TERM GOAL #3   Title Patient will report no burning sensation below her knees with bed mobility and transfers to improve function    Baseline burning sensation travels down to anterior shins bil with bed mobility and transfers; burning sensation upto knees mostly intermittently below knee -02/26/20    Time 4    Period Weeks    Status Achieved    Target Date 02/23/20             PT Long Term Goals - 05/16/20 1811      PT LONG TERM GOAL #1   Title Patient will report <3/10 pain in left hip with 30 min of standing/walking to improve  shopping    Baseline 6/10 pain 15-20 min (eval); 1 hour before pain goes >3/10 02/26/20    Time 8    Period Weeks    Status Achieved      PT LONG TERM GOAL #2   Title Patient will be able to sleep on her sides for 2-3 hours without waking up from pain to improve sleeping.    Baseline unable to sleep on sides (Eval); able to sleep 1 hour on either side before needing to turn; Patient still reports waking up 2-3x per night due to pain    Time 6    Period Weeks    Status Achieved      PT LONG TERM GOAL #3   Title Pt will demo 5/5 strength in L hip to improve functional strnegth with functional activities.    Baseline 3+/5 grossly (eval); 5/5 grossly    Time 8    Period Weeks    Status Achieved      PT LONG TERM GOAL #4   Title Patient will report 50% improvement in symptoms of burning sensation in her legs with bed mobility and crawling    Baseline 25% (04/01/20); inconsistent 05/16/20 25-50%    Time 6    Period Weeks    Status Not Met                 Plan - 05/16/20 1812    Clinical Impression Statement Patient has been seen for total of 21 sessions from 01/26/20 to 05/16/20 for chronic bil hip pain. Patient has progressed well. patient has met all of her short term goals and has met 3/4 long term goals. Patient has responded very well with pain neuroscience educations as it has helped her manager her pain while continuing to be active at home and work and modifying activities to prevent flare ups. Patient has reached maximum potential in physical therapy at this point and will be discharged from skilled PT.    Personal Factors and Comorbidities Comorbidity 2    Comorbidities LBP, L hip pain, L plantar fasciitis    Examination-Activity Limitations Bed Mobility;Carry;Sleep;Squat;Stairs;Stand;Transfers;Toileting    Examination-Participation Restrictions Church;Cleaning;Community Activity;Driving;Laundry;Meal Prep;Shop;Yard Work    Stability/Clinical Decision Making  Stable/Uncomplicated    Rehab Potential Good    PT Treatment/Interventions Neuromuscular re-education;Patient/family education    PT Next Visit Plan Discharge    PT Home Exercise Plan Access Code: [email or web address]: [email or web address]: 07/16/2021Prepared by: Gwenyth Bouillon PatelExercisesProne Hip Extension - 1 x daily -  7 x weekly - 2 sets - 10 repsSidelying Hip Abduction - 1 x daily - 7 x weekly - 2 sets - 10 repsSidelying Hip Adduction - 1 x daily - 7 x weekly - 2 sets - 10 repsSingle Leg Stance - 1 x daily - 7 x weekly - 1 sets - 5 reps - 20 holdSit to Stand with Arms Crossed - 1 x daily - 7 x weekly - 2 sets - 10 reps    Consulted and Agree with Plan of Care Patient           Patient will benefit from skilled therapeutic intervention in order to improve the following deficits and impairments:  Decreased range of motion, Decreased strength, Increased muscle spasms, Impaired flexibility, Impaired sensation, Postural dysfunction, Pain  Visit Diagnosis: Pain in left hip  Lumbar radicular pain  Chronic bilateral low back pain with right-sided sciatica  Trochanteric bursitis of left hip     Problem List Patient Active Problem List   Diagnosis Date Noted  . Paresthesia of lower extremity 12/01/2019  . Urinary incontinence 10/24/2019  . Reactive airway disease 10/24/2019  . Thoracic outlet syndrome 10/24/2019  . Environmental and seasonal allergies 10/24/2019  . Caffeine addiction (HCC)-in remission 10/24/2019  . Hypervitaminosis B6 10/24/2019  . Morbid obesity ([place]) 10/24/2019  . Plantar fascia syndrome 10/24/2019  . Hyperlipidemia 10/24/2019  . Neuropathy- b/l upper legs-   seeing ortho for this-[facility] 10/24/2019  . Routine physical examination 12/14/2016  . Peripheral neuropathy 12/14/2016  . Overweight 09/15/2016  . Asthma 09/14/2016    Kerrie Pleasure, PT 05/16/2020, 6:15 PM  [facility] [address]  [place], [place], [address] Phone: [phone]   Fax:  [phone]  Name: Denise Gamble MRN: [id number] Date of Birth: 10-Feb-1966

## 2020-05-31 ENCOUNTER — Other Ambulatory Visit: Payer: Self-pay | Admitting: Physician Assistant

## 2020-05-31 DIAGNOSIS — R32 Unspecified urinary incontinence: Secondary | ICD-10-CM

## 2020-05-31 DIAGNOSIS — J302 Other seasonal allergic rhinitis: Secondary | ICD-10-CM

## 2020-06-05 ENCOUNTER — Encounter: Payer: Self-pay | Admitting: Physician Assistant

## 2020-06-05 ENCOUNTER — Other Ambulatory Visit: Payer: Self-pay

## 2020-06-05 ENCOUNTER — Ambulatory Visit: Payer: Managed Care, Other (non HMO) | Admitting: Physician Assistant

## 2020-06-05 VITALS — BP 137/82 | HR 78 | Temp 98.8°F | Resp 16 | Ht 62.0 in | Wt 230.0 lb

## 2020-06-05 DIAGNOSIS — L089 Local infection of the skin and subcutaneous tissue, unspecified: Secondary | ICD-10-CM

## 2020-06-05 MED ORDER — DOXYCYCLINE HYCLATE 100 MG PO TABS
100.0000 mg | ORAL_TABLET | Freq: Two times a day (BID) | ORAL | Status: DC
Start: 2020-06-05 — End: 2021-09-11

## 2020-06-05 NOTE — Progress Notes (Signed)
  Subjective:     Patient ID: Denise Gamble, female   DOB: February 15, 1966, 54 y.o.   MRN: [id number]  HPI Pt complains of swelling and pain to left 1st toe.  Pt reports shoes rubbed her toe.  Pt reports she had corner of nail break off.  Pt started soaking in epsom salt yesterday.  Pt reports some decrease in swelling. Pt also reports some shortness of breath. Pt reports she has gained approx 20 pounds during covid   Past Medical History:  Diagnosis Date  . Allergy   . Asthma   . GERD (gastroesophageal reflux disease)   . History of MRSA infection   . Miscarriage 2013   multiple   . Paresthesia of lower extremity   . Peripheral neuropathy 2019   left foot  . Plantar fasciitis of left foot   . Thoracic outlet syndrome   . Urinary incontinence      Review of Systems  Respiratory: Positive for shortness of breath.   Skin: Positive for wound.  All other systems reviewed and are negative.      Objective:   Physical Exam Vitals and nursing note reviewed.  Constitutional:      Appearance: She is well-developed.  HENT:     Head: Normocephalic.  Cardiovascular:     Rate and Rhythm: Normal rate and regular rhythm.     Pulses: Normal pulses.     Heart sounds: Normal heart sounds.  Pulmonary:     Effort: Pulmonary effort is normal.  Abdominal:     General: Abdomen is flat. There is no distension.  Musculoskeletal:     Cervical back: Normal range of motion.  Neurological:     General: No focal deficit present.     Mental Status: She is alert and oriented to person, place, and time.  Psychiatric:        Mood and Affect: Mood normal.    MDM:  Pt's lungs and heart are normal.  Pt advised to use inhalers as directed.   Rx for doxycycline for toe infection.  See Podiatrist on Monday for recheck as scheduled  Increase exercise/diet changes to improve breathing     Assessment: Infection left 1st toe         Plan:

## 2020-06-06 ENCOUNTER — Encounter: Payer: Self-pay | Admitting: Physician Assistant

## 2020-06-06 ENCOUNTER — Telehealth: Payer: Self-pay | Admitting: Physician Assistant

## 2020-06-06 DIAGNOSIS — M488X9 Other specified spondylopathies, site unspecified: Secondary | ICD-10-CM

## 2020-06-06 DIAGNOSIS — E785 Hyperlipidemia, unspecified: Secondary | ICD-10-CM

## 2020-06-06 DIAGNOSIS — E559 Vitamin D deficiency, unspecified: Secondary | ICD-10-CM

## 2020-06-06 DIAGNOSIS — M064 Inflammatory polyarthropathy: Secondary | ICD-10-CM

## 2020-06-06 DIAGNOSIS — E539 Vitamin B deficiency, unspecified: Secondary | ICD-10-CM

## 2020-06-06 NOTE — Telephone Encounter (Signed)
Labs ordered per order request form given by patient. Please see mychart message. AS, CMA

## 2020-06-11 ENCOUNTER — Other Ambulatory Visit: Payer: Self-pay

## 2020-06-11 ENCOUNTER — Other Ambulatory Visit: Payer: Managed Care, Other (non HMO)

## 2020-06-11 DIAGNOSIS — E559 Vitamin D deficiency, unspecified: Secondary | ICD-10-CM

## 2020-06-11 DIAGNOSIS — M488X9 Other specified spondylopathies, site unspecified: Secondary | ICD-10-CM

## 2020-06-11 DIAGNOSIS — E785 Hyperlipidemia, unspecified: Secondary | ICD-10-CM

## 2020-06-11 DIAGNOSIS — M064 Inflammatory polyarthropathy: Secondary | ICD-10-CM

## 2020-06-11 DIAGNOSIS — E539 Vitamin B deficiency, unspecified: Secondary | ICD-10-CM

## 2020-06-14 ENCOUNTER — Encounter: Payer: Self-pay | Admitting: Physician Assistant

## 2020-06-14 ENCOUNTER — Ambulatory Visit (INDEPENDENT_AMBULATORY_CARE_PROVIDER_SITE_OTHER): Payer: Managed Care, Other (non HMO) | Admitting: Physician Assistant

## 2020-06-14 ENCOUNTER — Other Ambulatory Visit: Payer: Self-pay

## 2020-06-14 VITALS — BP 119/82 | HR 63 | Temp 98.4°F | Ht 62.0 in | Wt 232.1 lb

## 2020-06-14 DIAGNOSIS — R059 Cough, unspecified: Secondary | ICD-10-CM

## 2020-06-14 DIAGNOSIS — R8290 Unspecified abnormal findings in urine: Secondary | ICD-10-CM | POA: Diagnosis not present

## 2020-06-14 DIAGNOSIS — R109 Unspecified abdominal pain: Secondary | ICD-10-CM | POA: Diagnosis not present

## 2020-06-14 DIAGNOSIS — E785 Hyperlipidemia, unspecified: Secondary | ICD-10-CM

## 2020-06-14 DIAGNOSIS — R202 Paresthesia of skin: Secondary | ICD-10-CM

## 2020-06-14 DIAGNOSIS — L299 Pruritus, unspecified: Secondary | ICD-10-CM | POA: Diagnosis not present

## 2020-06-14 LAB — POCT URINALYSIS DIPSTICK
Bilirubin, UA: NEGATIVE
Blood, UA: NEGATIVE
Glucose, UA: NEGATIVE
Ketones, UA: NEGATIVE
Nitrite, UA: NEGATIVE
Protein, UA: NEGATIVE
Spec Grav, UA: <=1.005 — AB (ref 1.010–1.025)
Urobilinogen, UA: 0.2 E.U./dL
pH, UA: 5.5 (ref 5.0–8.0)

## 2020-06-14 MED ORDER — HYDROCORTISONE-ACETIC ACID 1-2 % OT SOLN: 4.0000 [drp] | Freq: Two times a day (BID) | OTIC | 0 refills | Status: DC

## 2020-06-14 NOTE — Progress Notes (Signed)
ac  Established Patient Office Visit  Subjective:  Patient ID: Denise Gamble, female    DOB: 1965-12-06  Age: 54 y.o. MRN: [id number]  CC:  Chief Complaint  Patient presents with  . Hyperlipidemia    HPI Denise Gamble presents for follow up on hyperlipidemia and discuss most recent labs. Patient reports PT has been helpful and chronic back pain has improved. Continues to have burning sensation of lower extremities and questions if it could possibly be related to chronic use of Ibuprofen. Complains of right flank pain > 1 week. Reports a history of UTI with atypical presentation. Denies dysuria. Has a history of incontinence. Continues to have dry intermittent cough. Also complains of left ear pruritis. Denies otalgia.  HLD: Pt trying to manage with diet and lifestyle changes. Patient has not been as active due to chronic pain which is improving and is hopeful she will be able to resume her activity level to help improve cholesterol.    Past Medical History:  Diagnosis Date  . Allergy   . Asthma   . GERD (gastroesophageal reflux disease)   . History of MRSA infection   . Miscarriage 2013   multiple   . Paresthesia of lower extremity   . Peripheral neuropathy 2019   left foot  . Plantar fasciitis of left foot   . Thoracic outlet syndrome   . Urinary incontinence     Past Surgical History:  Procedure Laterality Date  . DILATION AND CURETTAGE OF UTERUS    . LYMPHADENECTOMY  1979    Family History  Problem Relation Age of Onset  . Heart disease Mother 9       epstein anolmay   . Dementia Mother   . Colon cancer Father 7  . Skin cancer Father        ? thinks melanoma?   . Leukemia Father        AAL  . Hyperlipidemia Father   . Hypertension Father   . Heart disease Brother   . Alcohol abuse Brother   . Diabetes Brother   . Hypertension Brother   . Prostate cancer Brother   . Coronary artery disease Brother   . Hyperlipidemia Sister   . Hypertension Sister    . Stroke Sister   . Cancer Sister        Lung Ca w/Mets to brain  . Heart attack Maternal Grandfather   . Hypertension Sister   . Alzheimer's disease Maternal Aunt   . Breast cancer Neg Hx     Social History   Socioeconomic History  . Marital status: Married    Spouse name: Eddie Dibbles  . Number of children: 0  . Years of education: Not on file  . Highest education level: Master's degree (e.g., MA, MS, MEng, MEd, MSW, MBA)  Occupational History  . Not on file  Tobacco Use  . Smoking status: Never Smoker  . Smokeless tobacco: Never Used  Vaping Use  . Vaping Use: Never used  Substance and Sexual Activity  . Alcohol use: No    Alcohol/week: 0.0 standard drinks    Comment: quit 2010  . Drug use: No  . Sexual activity: Yes    Comment: perimenopausal  Other Topics Concern  . Not on file  Social History Narrative   Works for [employer] to [employer]   Married   No caffeine            Social Determinants of Engineer, drilling  Resource Strain:   . Difficulty of Paying Living Expenses: Not on file  Food Insecurity:   . Worried About Charity fundraiser in the Last Year: Not on file  . Ran Out of Food in the Last Year: Not on file  Transportation Needs:   . Lack of Transportation (Medical): Not on file  . Lack of Transportation (Non-Medical): Not on file  Physical Activity:   . Days of Exercise per Week: Not on file  . Minutes of Exercise per Session: Not on file  Stress:   . Feeling of Stress : Not on file  Social Connections:   . Frequency of Communication with Friends and Family: Not on file  . Frequency of Social Gatherings with Friends and Family: Not on file  . Attends Religious Services: Not on file  . Active Member of Clubs or Organizations: Not on file  . Attends Archivist Meetings: Not on file  . Marital Status: Not on file  Intimate Partner Violence:   . Fear of Current or Ex-Partner: Not on file  . Emotionally Abused: Not on  file  . Physically Abused: Not on file  . Sexually Abused: Not on file    Outpatient Medications Prior to Visit  Medication Sig Dispense Refill  . ADVAIR HFA 45-21 MCG/ACT inhaler Inhale 2 puffs into the lungs 2 (two) times daily. 1 Inhaler 2  . albuterol (PROAIR HFA) 108 (90 Base) MCG/ACT inhaler Inhale 2 puffs into the lungs every 4 (four) hours as needed for wheezing or shortness of breath. 18 g 2  . cetirizine (ZYRTEC) 10 MG tablet Take 10 mg by mouth.    Marland Kitchen ibuprofen (ADVIL,MOTRIN) 200 MG tablet Take 200 mg by mouth every 6 (six) hours as needed.    . montelukast (SINGULAIR) 10 MG tablet TAKE 1 TABLET AT BEDTIME 90 tablet 0  . omeprazole (PRILOSEC) 20 MG capsule Take 20 mg by mouth daily.    Marland Kitchen oxybutynin (DITROPAN-XL) 10 MG 24 hr tablet TAKE 1 TABLET AT BEDTIME 90 tablet 0   Facility-Administered Medications Prior to Visit  Medication Dose Route Frequency Provider Last Rate Last Admin  . doxycycline (VIBRA-TABS) tablet 100 mg  100 mg Oral Q12H Fransico Meadow, [place]        Allergies  Allergen Reactions  . Pollen Extract Hives  . Latex Hives    ROS Review of Systems A fourteen system review of systems was performed and found to be positive as per HPI.  Objective:    Physical Exam General:  Well Developed, well nourished, appropriate for stated age.  Neuro:  Alert and oriented,  extra-ocular muscles intact  HEENT:  Normocephalic, atraumatic, normal TM's of both ears, no adenopathy  Skin:  no gross rash, warm, pink. Cardiac:  RRR, S1 S2 Respiratory:  ECTA B/L, Not using accessory muscles, speaking in full sentences- unlabored. Vascular:  Ext warm, no cyanosis apprec.; cap RF less 2 sec. Psych:  No HI/SI, judgement and insight good, Euthymic mood. Full Affect.  BP 119/82   Pulse 63   Temp 98.4 F (36.9 C) (Oral)   Ht 5' 2"  (1.575 m)   Wt 232 lb 1.6 oz (105.3 kg)   LMP 03/29/2019   SpO2 100% Comment: on RA  BMI 42.45 kg/m  Wt Readings from Last 3 Encounters:   06/14/20 232 lb 1.6 oz (105.3 kg)  06/05/20 230 lb (104.3 kg)  01/09/20 222 lb 3.2 oz (100.8 kg)     Health Maintenance Due  Topic  Date Due  . COLONOSCOPY  Never done  . PAP SMEAR-Modifier  12/15/2019  . INFLUENZA VACCINE  03/10/2020    There are no preventive care reminders to display for this patient.  Lab Results  Component Value Date   TSH 3.370 10/27/2019   Lab Results  Component Value Date   WBC 6.1 06/11/2020   HGB 12.3 06/11/2020   HCT 37.2 06/11/2020   MCV 93 06/11/2020   PLT 289 06/11/2020   Lab Results  Component Value Date   NA 140 10/27/2019   K 4.8 10/27/2019   CO2 25 10/27/2019   GLUCOSE 85 10/27/2019   BUN 11 10/27/2019   CREATININE 0.91 10/27/2019   BILITOT 0.4 10/27/2019   ALKPHOS 113 10/27/2019   AST 17 10/27/2019   ALT 13 10/27/2019   PROT 6.7 10/27/2019   ALBUMIN 4.0 10/27/2019   CALCIUM 9.5 10/27/2019   ANIONGAP 4 (L) 09/27/2018   GFR 76.70 12/14/2016   Lab Results  Component Value Date   CHOL 218 (H) 06/11/2020   Lab Results  Component Value Date   HDL 42 06/11/2020   Lab Results  Component Value Date   LDLCALC 158 (H) 06/11/2020   Lab Results  Component Value Date   TRIG 101 06/11/2020   Lab Results  Component Value Date   CHOLHDL 5.2 (H) 06/11/2020   Lab Results  Component Value Date   HGBA1C 5.2 10/27/2019      Assessment & Plan:   Problem List Items Addressed This Visit      Other   Hyperlipidemia - Primary   Paresthesia of lower extremity    Other Visit Diagnoses    Ear itching       Relevant Medications   acetic acid-hydrocortisone (VOSOL-HC) OTIC solution   Flank pain       Relevant Orders   POCT urinalysis dipstick (Completed)   CULTURE, URINE COMPREHENSIVE (Completed)   Abnormal urinalysis       Relevant Orders   CULTURE, URINE COMPREHENSIVE (Completed)   Cough         Hyperlipidemia: -Most recent lipid panel: Total cholesterol 218, triglycerides 101, HDL 42, LDL 158 -The 10-year ASCVD  risk score Mikey Bussing DC Jr., et al., 2013) is: 1.9%  -Recommend to follow a heart healthy diet low in saturated and transfats. Increase physical activity as tolerable. -Will continue to monitor.  Flank pain: -Will collect urinalysis to evaluate for possible UTI.  If UA abnormal will send for urine culture and pending results will start antibiotic therapy if indicated. -Recommend to apply heat/ice and massage the area.  Ear itching:  -No signs of ruptured TM on exam so will start acetic acid-hydrocortisone for symptom relief.  Cough: -Discussed with patient various etiology for intermittent cough including laryngotracheal reflux, upper respiratory conditions, medication side effects, or pulmonary disorders.  Patient prefers to monitor symptoms before proceeding with imaging studies (chest x-ray). -Recommend to keep a journal of cough onset to evaluate for possible triggers and/or causes.  Paresthesia of lower extremity: -Discussed with patient paresthesias can potentially be a side effect of NSAIDs. Recommend to trial Tylenol for pain relief instead of ibuprofen to see if symptoms improve or resolve.  -Patient has been evaluated by Neurology and recommended PT.  -Continue with PT.   Meds ordered this encounter  Medications  . acetic acid-hydrocortisone (VOSOL-HC) OTIC solution    Sig: Place 4 drops into the left ear 2 (two) times daily.    Dispense:  10 mL  Refill:  0    Order Specific Question:   Supervising Provider    Answer:   Beatrice Lecher D [[id number]]    Follow-up: Return in about 6 months (around 12/12/2020) for CPE and FBW prior to visit  .   Note:  This note was prepared with assistance of Dragon voice recognition software. Occasional wrong-word or sound-a-like substitutions may have occurred due to the inherent limitations of voice recognition software.  Denise Reid, PA-C

## 2020-06-14 NOTE — Patient Instructions (Signed)

## 2020-06-18 LAB — CULTURE, URINE COMPREHENSIVE

## 2020-06-19 LAB — LIPID PANEL
Chol/HDL Ratio: 5.2 ratio — ABNORMAL HIGH (ref 0.0–4.4)
Cholesterol, Total: 218 mg/dL — ABNORMAL HIGH (ref 100–199)
HDL: 42 mg/dL (ref >39)
LDL Chol Calc (NIH): 158 mg/dL — ABNORMAL HIGH (ref 0–99)
Triglycerides: 101 mg/dL (ref 0–149)
VLDL Cholesterol Cal: 18 mg/dL (ref 5–40)

## 2020-06-19 LAB — CBC WITH DIFFERENTIAL/PLATELET
Basophils Absolute: 0.1 10*3/uL (ref 0.0–0.2)
Basos: 1 %
EOS (ABSOLUTE): 0.4 10*3/uL (ref 0.0–0.4)
Eos: 6 %
Hematocrit: 37.2 % (ref 34.0–46.6)
Hemoglobin: 12.3 g/dL (ref 11.1–15.9)
Immature Grans (Abs): 0 10*3/uL (ref 0.0–0.1)
Immature Granulocytes: 0 %
Lymphocytes Absolute: 1.6 10*3/uL (ref 0.7–3.1)
Lymphs: 27 %
MCH: 30.8 pg (ref 26.6–33.0)
MCHC: 33.1 g/dL (ref 31.5–35.7)
MCV: 93 fL (ref 79–97)
Monocytes Absolute: 0.4 10*3/uL (ref 0.1–0.9)
Monocytes: 7 %
Neutrophils Absolute: 3.6 10*3/uL (ref 1.4–7.0)
Neutrophils: 59 %
Platelets: 289 10*3/uL (ref 150–450)
RBC: 4 x10E6/uL (ref 3.77–5.28)
RDW: 12.8 % (ref 11.7–15.4)
WBC: 6.1 10*3/uL (ref 3.4–10.8)

## 2020-06-19 LAB — HOMOCYSTEINE: Homocysteine: 15.4 umol/L — ABNORMAL HIGH (ref 0.0–14.5)

## 2020-06-19 LAB — HLA-B27 ANTIGEN: HLA B27: NEGATIVE

## 2020-06-19 LAB — RHEUMATOID FACTOR: Rheumatoid fact SerPl-aCnc: <10 IU/mL (ref 0.0–13.9)

## 2020-06-19 LAB — URIC ACID: Uric Acid: 5.1 mg/dL (ref 3.0–7.2)

## 2020-06-19 LAB — VITAMIN D 25 HYDROXY (VIT D DEFICIENCY, FRACTURES): Vit D, 25-Hydroxy: 35.5 ng/mL (ref 30.0–100.0)

## 2020-06-19 LAB — VITAMIN B12: Vitamin B-12: 646 pg/mL (ref 232–1245)

## 2020-06-19 LAB — SEDIMENTATION RATE: Sed Rate: 18 mm/hr (ref 0–40)

## 2020-06-28 ENCOUNTER — Ambulatory Visit
Admission: RE | Admit: 2020-06-28 | Discharge: 2020-06-28 | Disposition: A | Discharge status: Home / Self Care | Payer: Managed Care, Other (non HMO) | Source: Ambulatory Visit | Attending: Physician Assistant | Admitting: Physician Assistant

## 2020-06-28 ENCOUNTER — Other Ambulatory Visit: Payer: Self-pay

## 2020-06-28 DIAGNOSIS — Z1231 Encounter for screening mammogram for malignant neoplasm of breast: Secondary | ICD-10-CM | POA: Diagnosis not present

## 2020-07-04 ENCOUNTER — Other Ambulatory Visit: Payer: Self-pay | Admitting: Physician Assistant

## 2020-07-04 DIAGNOSIS — J4520 Mild intermittent asthma, uncomplicated: Secondary | ICD-10-CM

## 2020-07-08 ENCOUNTER — Other Ambulatory Visit: Payer: Self-pay

## 2020-07-08 ENCOUNTER — Ambulatory Visit (AMBULATORY_SURGERY_CENTER): Payer: Self-pay

## 2020-07-08 ENCOUNTER — Encounter: Payer: Self-pay | Admitting: Internal Medicine

## 2020-07-08 VITALS — Ht 62.0 in | Wt 231.0 lb

## 2020-07-08 DIAGNOSIS — Z1211 Encounter for screening for malignant neoplasm of colon: Secondary | ICD-10-CM

## 2020-07-08 MED ORDER — SUTAB 1479-225-188 MG PO TABS: 1.0000 | ORAL_TABLET | ORAL | 0 refills | Status: DC

## 2020-07-08 NOTE — Progress Notes (Signed)
No egg or soy allergy known to patient  Issues with past sedation with any surgeries or procedures---patient reports she has had a "tramautic reaction to anesthesia post operation when being moved from operating table to stretcher"; - patient reports she was combative;  No intubation problems in the past  No FH of Malignant Hyperthermia No diet pills per patient No home 02 use per patient  No blood thinners per patient  Pt denies issues with constipation  No A fib or A flutter  EMMI video via [place] 19 guidelines implemented in PV today with Pt and RN  Coupon given to pt in PV today , Code to Pharmacy  COVID vaccines completed on 11/2019 per pt; Due to the COVID-19 pandemic we are asking patients to follow these guidelines. Please only bring one care partner. Please be aware that your care partner may wait in the car in the parking lot or if they feel like they will be too hot to wait in the car, they may wait in the lobby on the 4th floor. All care partners are required to wear a mask the entire time (we do not have any that we can provide them), they need to practice social distancing, and we will do a Covid check for all patient's and care partners when you arrive. Also we will check their temperature and your temperature. If the care partner waits in their car they need to stay in the parking lot the entire time and we will call them on their cell phone when the patient is ready for discharge so they can bring the car to the front of the building. Also all patient's will need to wear a mask into building.

## 2020-07-09 ENCOUNTER — Other Ambulatory Visit: Payer: Self-pay | Admitting: Physician Assistant

## 2020-07-09 ENCOUNTER — Encounter: Payer: Self-pay | Admitting: Physician Assistant

## 2020-07-09 DIAGNOSIS — Z0000 Encounter for general adult medical examination without abnormal findings: Secondary | ICD-10-CM

## 2020-07-09 DIAGNOSIS — M064 Inflammatory polyarthropathy: Secondary | ICD-10-CM

## 2020-07-10 ENCOUNTER — Other Ambulatory Visit: Payer: Self-pay

## 2020-07-10 ENCOUNTER — Other Ambulatory Visit: Payer: Managed Care, Other (non HMO)

## 2020-07-10 DIAGNOSIS — M064 Inflammatory polyarthropathy: Secondary | ICD-10-CM

## 2020-07-10 DIAGNOSIS — Z0000 Encounter for general adult medical examination without abnormal findings: Secondary | ICD-10-CM

## 2020-07-11 LAB — TSH: TSH: 3.5 u[IU]/mL (ref 0.450–4.500)

## 2020-07-11 LAB — ANA: Anti Nuclear Antibody (ANA): NEGATIVE

## 2020-07-14 ENCOUNTER — Encounter: Payer: Self-pay | Admitting: Physician Assistant

## 2020-07-14 DIAGNOSIS — J302 Other seasonal allergic rhinitis: Secondary | ICD-10-CM

## 2020-07-14 DIAGNOSIS — R32 Unspecified urinary incontinence: Secondary | ICD-10-CM

## 2020-07-18 ENCOUNTER — Encounter: Payer: Self-pay | Admitting: Physician Assistant

## 2020-07-22 ENCOUNTER — Ambulatory Visit (AMBULATORY_SURGERY_CENTER): Payer: Managed Care, Other (non HMO) | Admitting: Internal Medicine

## 2020-07-22 ENCOUNTER — Other Ambulatory Visit: Payer: Self-pay

## 2020-07-22 ENCOUNTER — Encounter: Payer: Self-pay | Admitting: Internal Medicine

## 2020-07-22 VITALS — BP 110/62 | HR 63 | Temp 97.5°F | Resp 18 | Ht 62.0 in | Wt 231.0 lb

## 2020-07-22 DIAGNOSIS — Z1211 Encounter for screening for malignant neoplasm of colon: Secondary | ICD-10-CM | POA: Diagnosis present

## 2020-07-22 DIAGNOSIS — D122 Benign neoplasm of ascending colon: Secondary | ICD-10-CM

## 2020-07-22 MED ORDER — SODIUM CHLORIDE 0.9 % IV SOLN: 500.0000 mL | Freq: Once | INTRAVENOUS | Status: DC

## 2020-07-22 NOTE — Op Note (Signed)
[facility] Patient Name: Denise Gamble Procedure Date: 07/22/2020 11:34 AM MRN: [id number] Endoscopist: Docia Chuck. Henrene Pastor , MD Age: 54 Referring MD:  Date of Birth: Feb 24, 1966 Gender: Female Account #: [id number] Procedure:                Colonoscopy with cold snare polypectomy x 1 Indications:              Screening in patient at increased risk: Colorectal                            cancer in father 40 or older (late 90s) Medicines:                Monitored Anesthesia Care Procedure:                Pre-Anesthesia Assessment:                           - Prior to the procedure, a History and Physical                            was performed, and patient medications and                            allergies were reviewed. The patient's tolerance of                            previous anesthesia was also reviewed. The risks                            and benefits of the procedure and the sedation                            options and risks were discussed with the patient.                            All questions were answered, and informed consent                            was obtained. Prior Anticoagulants: The patient has                            taken no previous anticoagulant or antiplatelet                            agents. ASA Grade Assessment: II - A patient with                            mild systemic disease. After reviewing the risks                            and benefits, the patient was deemed in                            satisfactory condition to undergo the procedure.  After obtaining informed consent, the colonoscope                            was passed under direct vision. Throughout the                            procedure, the patient's blood pressure, pulse, and                            oxygen saturations were monitored continuously. The                            Colonoscope was introduced through the anus and                             advanced to the the cecum, identified by                            appendiceal orifice and ileocecal valve. The                            ileocecal valve, appendiceal orifice, and rectum                            were photographed. The quality of the bowel                            preparation was good. The colonoscopy was performed                            without difficulty. The patient tolerated the                            procedure well. The bowel preparation used was                            SUPREP via split dose instruction. Scope In: 11:41:20 AM Scope Out: 11:54:00 AM Scope Withdrawal Time: 0 hours 9 minutes 12 seconds  Total Procedure Duration: 0 hours 12 minutes 40 seconds  Findings:                 A 2 mm polyp was found in the ascending colon. The                            polyp was removed with a cold snare. Resection and                            retrieval were complete.                           A few small-mouthed diverticula were found in the                            sigmoid colon.  The exam was otherwise without abnormality on                            direct and retroflexion views. Complications:            No immediate complications. Estimated blood loss:                            None. Estimated Blood Loss:     Estimated blood loss: none. Impression:               - One 2 mm polyp in the ascending colon, removed                            with a cold snare. Resected and retrieved.                           - Diverticulosis in the sigmoid colon.                           - The examination was otherwise normal on direct                            and retroflexion views. Recommendation:           - Repeat colonoscopy in 5-10 years for surveillance.                           - Patient has a contact number available for                            emergencies. The signs and symptoms of potential                             delayed complications were discussed with the                            patient. Return to normal activities tomorrow.                            Written discharge instructions were provided to the                            patient.                           - Resume previous diet.                           - Continue present medications.                           - Await pathology results. Docia Chuck. Henrene Pastor, MD 07/22/2020 12:02:23 PM This report has been signed electronically.

## 2020-07-22 NOTE — Progress Notes (Signed)
VS-CW  Pt's states no medical or surgical changes since previsit or office visit.

## 2020-07-22 NOTE — Progress Notes (Signed)
Called to room to assist during endoscopic procedure.  Patient ID and intended procedure confirmed with present staff. Received instructions for my participation in the procedure from the performing physician.  

## 2020-07-22 NOTE — Progress Notes (Signed)
A and O x3. Report to RN. Tolerated MAC anesthesia well.

## 2020-07-22 NOTE — Patient Instructions (Signed)
YOU HAD AN ENDOSCOPIC PROCEDURE TODAY AT THE [facility]:   Refer to the procedure report that was given to you for any specific questions about what was found during the examination.  If the procedure report does not answer your questions, please call your gastroenterologist to clarify.  If you requested that your care partner not be given the details of your procedure findings, then the procedure report has been included in a sealed envelope for you to review at your convenience later.  YOU SHOULD EXPECT: Some feelings of bloating in the abdomen. Passage of more gas than usual.  Walking can help get rid of the air that was put into your GI tract during the procedure and reduce the bloating. If you had a lower endoscopy (such as a colonoscopy or flexible sigmoidoscopy) you may notice spotting of blood in your stool or on the toilet paper. If you underwent a bowel prep for your procedure, you may not have a normal bowel movement for a few days.  Please Note:  You might notice some irritation and congestion in your nose or some drainage.  This is from the oxygen used during your procedure.  There is no need for concern and it should clear up in a day or so.  SYMPTOMS TO REPORT IMMEDIATELY:   Following lower endoscopy (colonoscopy or flexible sigmoidoscopy):  Excessive amounts of blood in the stool  Significant tenderness or worsening of abdominal pains  Swelling of the abdomen that is new, acute  Fever of 100F or higher  For urgent or emergent issues, a gastroenterologist can be reached at any hour by calling [phone]. Do not use MyChart messaging for urgent concerns.    DIET:  We do recommend a small meal at first, but then you may proceed to your regular diet.  Drink plenty of fluids but you should avoid alcoholic beverages for 24 hours.  ACTIVITY:  You should plan to take it easy for the rest of today and you should NOT DRIVE or use heavy machinery until tomorrow (because  of the sedation medicines used during the test).    FOLLOW UP: Our staff will call the number listed on your records 48-72 hours following your procedure to check on you and address any questions or concerns that you may have regarding the information given to you following your procedure. If we do not reach you, we will leave a message.  We will attempt to reach you two times.  During this call, we will ask if you have developed any symptoms of COVID 19. If you develop any symptoms (ie: fever, flu-like symptoms, shortness of breath, cough etc.) before then, please call [phone].  If you test positive for Covid 19 in the 2 weeks post procedure, please call and report this information to us.    If any biopsies were taken you will be contacted by phone or by letter within the next 1-3 weeks.  Please call us at [phone] if you have not heard about the biopsies in 3 weeks.    SIGNATURES/CONFIDENTIALITY: You and/or your care partner have signed paperwork which will be entered into your electronic medical record.  These signatures attest to the fact that that the information above on your After Visit Summary has been reviewed and is understood.  Full responsibility of the confidentiality of this discharge information lies with you and/or your care-partner. 

## 2020-07-24 ENCOUNTER — Telehealth: Payer: Self-pay | Admitting: *Deleted

## 2020-07-24 ENCOUNTER — Telehealth: Payer: Self-pay

## 2020-07-24 NOTE — Telephone Encounter (Signed)
Second attempt, left VM.

## 2020-07-24 NOTE — Telephone Encounter (Signed)
NO ANSWER, MESSAGE LEFT FOR PATIENT.

## 2020-07-26 ENCOUNTER — Encounter: Payer: Self-pay | Admitting: Internal Medicine

## 2020-07-29 MED ORDER — MONTELUKAST SODIUM 10 MG PO TABS: 10.0000 mg | ORAL_TABLET | Freq: Every day | ORAL | 0 refills | Status: DC

## 2020-07-29 MED ORDER — OXYBUTYNIN CHLORIDE ER 10 MG PO TB24: 10.0000 mg | ORAL_TABLET | Freq: Every day | ORAL | 0 refills | Status: DC

## 2020-08-07 ENCOUNTER — Telehealth: Payer: Self-pay | Admitting: Physician Assistant

## 2020-08-07 NOTE — Telephone Encounter (Signed)
Attempted to call patient with no answer. Left message for patient to call back. Patient has not made a follow up apt as discussed in mychart message concerning the same issue. That advise was per [facility]. Patient should schedule an appointment to discuss current concerns.

## 2020-09-16 ENCOUNTER — Other Ambulatory Visit: Payer: Self-pay | Admitting: Physician Assistant

## 2020-09-16 DIAGNOSIS — J4520 Mild intermittent asthma, uncomplicated: Secondary | ICD-10-CM

## 2020-11-07 ENCOUNTER — Other Ambulatory Visit: Payer: Self-pay | Admitting: Physician Assistant

## 2020-11-07 DIAGNOSIS — J302 Other seasonal allergic rhinitis: Secondary | ICD-10-CM

## 2020-11-07 DIAGNOSIS — R32 Unspecified urinary incontinence: Secondary | ICD-10-CM

## 2020-12-12 ENCOUNTER — Other Ambulatory Visit: Payer: Self-pay | Admitting: Physician Assistant

## 2020-12-12 DIAGNOSIS — Z0000 Encounter for general adult medical examination without abnormal findings: Secondary | ICD-10-CM

## 2020-12-12 DIAGNOSIS — E785 Hyperlipidemia, unspecified: Secondary | ICD-10-CM

## 2020-12-12 DIAGNOSIS — E559 Vitamin D deficiency, unspecified: Secondary | ICD-10-CM

## 2020-12-16 ENCOUNTER — Other Ambulatory Visit: Payer: Managed Care, Other (non HMO)

## 2020-12-16 ENCOUNTER — Other Ambulatory Visit: Payer: Self-pay

## 2020-12-16 DIAGNOSIS — Z0000 Encounter for general adult medical examination without abnormal findings: Secondary | ICD-10-CM

## 2020-12-16 DIAGNOSIS — E559 Vitamin D deficiency, unspecified: Secondary | ICD-10-CM

## 2020-12-16 DIAGNOSIS — E785 Hyperlipidemia, unspecified: Secondary | ICD-10-CM

## 2020-12-17 ENCOUNTER — Encounter: Payer: Self-pay | Admitting: Physician Assistant

## 2020-12-17 ENCOUNTER — Telehealth: Payer: Self-pay | Admitting: Physician Assistant

## 2020-12-17 LAB — HEMOGLOBIN A1C
Est. average glucose Bld gHb Est-mCnc: 114 mg/dL
Hgb A1c MFr Bld: 5.6 % (ref 4.8–5.6)

## 2020-12-17 LAB — COMPREHENSIVE METABOLIC PANEL
ALT: 15 IU/L (ref 0–32)
AST: 21 IU/L (ref 0–40)
Albumin/Globulin Ratio: 1.3 (ref 1.2–2.2)
Albumin: 4 g/dL (ref 3.8–4.9)
Alkaline Phosphatase: 138 IU/L — ABNORMAL HIGH (ref 44–121)
BUN/Creatinine Ratio: 17 (ref 9–23)
BUN: 14 mg/dL (ref 6–24)
Bilirubin Total: 0.4 mg/dL (ref 0.0–1.2)
CO2: 24 mmol/L (ref 20–29)
Calcium: 9.1 mg/dL (ref 8.7–10.2)
Chloride: 104 mmol/L (ref 96–106)
Creatinine, Ser: 0.84 mg/dL (ref 0.57–1.00)
Globulin, Total: 3.2 g/dL (ref 1.5–4.5)
Glucose: 89 mg/dL (ref 65–99)
Potassium: 4.7 mmol/L (ref 3.5–5.2)
Sodium: 141 mmol/L (ref 134–144)
Total Protein: 7.2 g/dL (ref 6.0–8.5)
eGFR: 84 mL/min/{1.73_m2} (ref >59)

## 2020-12-17 LAB — LIPID PANEL
Chol/HDL Ratio: 5.5 ratio — ABNORMAL HIGH (ref 0.0–4.4)
Cholesterol, Total: 249 mg/dL — ABNORMAL HIGH (ref 100–199)
HDL: 45 mg/dL (ref >39)
LDL Chol Calc (NIH): 186 mg/dL — ABNORMAL HIGH (ref 0–99)
Triglycerides: 101 mg/dL (ref 0–149)
VLDL Cholesterol Cal: 18 mg/dL (ref 5–40)

## 2020-12-17 LAB — CBC
Hematocrit: 40 % (ref 34.0–46.6)
Hemoglobin: 13.2 g/dL (ref 11.1–15.9)
MCH: 30.7 pg (ref 26.6–33.0)
MCHC: 33 g/dL (ref 31.5–35.7)
MCV: 93 fL (ref 79–97)
Platelets: 275 10*3/uL (ref 150–450)
RBC: 4.3 x10E6/uL (ref 3.77–5.28)
RDW: 12.9 % (ref 11.7–15.4)
WBC: 6.1 10*3/uL (ref 3.4–10.8)

## 2020-12-17 LAB — TSH: TSH: 5.45 u[IU]/mL — ABNORMAL HIGH (ref 0.450–4.500)

## 2020-12-17 LAB — VITAMIN D 25 HYDROXY (VIT D DEFICIENCY, FRACTURES): Vit D, 25-Hydroxy: 53.1 ng/mL (ref 30.0–100.0)

## 2020-12-17 NOTE — Telephone Encounter (Signed)
-----   Message from [name], [place] sent at 12/17/2020 11:43 AM EDT ----- Please call [facility] and add free T4 and T3.  Thank you, Herb Grays

## 2020-12-17 NOTE — Telephone Encounter (Signed)
Labs have been added. AS, CMA 

## 2020-12-19 LAB — SPECIMEN STATUS REPORT

## 2020-12-19 LAB — T3: T3, Total: 118 ng/dL (ref 71–180)

## 2020-12-19 LAB — T4, FREE: Free T4: 1.03 ng/dL (ref 0.82–1.77)

## 2020-12-20 ENCOUNTER — Ambulatory Visit (INDEPENDENT_AMBULATORY_CARE_PROVIDER_SITE_OTHER): Payer: Managed Care, Other (non HMO) | Admitting: Physician Assistant

## 2020-12-20 ENCOUNTER — Other Ambulatory Visit: Payer: Self-pay

## 2020-12-20 ENCOUNTER — Encounter: Payer: Self-pay | Admitting: Physician Assistant

## 2020-12-20 VITALS — BP 122/85 | HR 86 | Temp 99.4°F | Ht 63.0 in | Wt 242.7 lb

## 2020-12-20 DIAGNOSIS — G6289 Other specified polyneuropathies: Secondary | ICD-10-CM

## 2020-12-20 DIAGNOSIS — E038 Other specified hypothyroidism: Secondary | ICD-10-CM

## 2020-12-20 DIAGNOSIS — R29898 Other symptoms and signs involving the musculoskeletal system: Secondary | ICD-10-CM | POA: Diagnosis not present

## 2020-12-20 DIAGNOSIS — Z0000 Encounter for general adult medical examination without abnormal findings: Secondary | ICD-10-CM

## 2020-12-20 DIAGNOSIS — H60502 Unspecified acute noninfective otitis externa, left ear: Secondary | ICD-10-CM

## 2020-12-20 MED ORDER — OFLOXACIN 0.3 % OT SOLN: 10.0000 [drp] | Freq: Every day | OTIC | 0 refills | Status: DC

## 2020-12-20 NOTE — Progress Notes (Signed)
Subjective:     Denise Gamble is a 55 y.o. female and is here for a comprehensive physical exam. The patient reports problems - continues to have weakness of lower extremity with intermittent paresthesia, dizziness, ear fullnes.  Social History   Socioeconomic History  . Marital status: Married    Spouse name: Eddie Dibbles  . Number of children: 0  . Years of education: Not on file  . Highest education level: Master's degree (e.g., MA, MS, MEng, MEd, MSW, MBA)  Occupational History  . Not on file  Tobacco Use  . Smoking status: Never Smoker  . Smokeless tobacco: Never Used  Vaping Use  . Vaping Use: Never used  Substance and Sexual Activity  . Alcohol use: No    Alcohol/week: 0.0 standard drinks    Comment: quit 2010  . Drug use: No  . Sexual activity: Yes    Comment: perimenopausal  Other Topics Concern  . Not on file  Social History Narrative   Works for [employer] to [employer] health   Married   No caffeine            Social Determinants of Radio broadcast assistant Strain: Not on file  Food Insecurity: Not on file  Transportation Needs: Not on file  Physical Activity: Not on file  Stress: Not on file  Social Connections: Not on file  Intimate Partner Violence: Not on file   Health Maintenance  Topic Date Due  . PAP SMEAR-Modifier  12/15/2019  . COVID-19 Vaccine (2 - Booster for [employer] series) 01/13/2020  . INFLUENZA VACCINE  03/10/2021  . MAMMOGRAM  06/28/2022  . COLONOSCOPY (Pts 45-12yr Insurance coverage will need to be confirmed)  07/22/2025  . TETANUS/TDAP  12/31/2029  . Hepatitis C Screening  Completed  . HIV Screening  Completed  . HPV VACCINES  Aged Out    The following portions of the patient's history were reviewed and updated as appropriate: allergies, current medications, past family history, past medical history, past social history, past surgical history and problem list.  Review of Systems Pertinent items noted in HPI and  remainder of comprehensive ROS otherwise negative.   Objective:    BP 122/85   Pulse 86   Temp 99.4 F (37.4 C)   Ht 5' 3"  (1.6 m)   Wt 242 lb 11.2 oz (110.1 kg)   LMP 03/29/2019   SpO2 99%   BMI 42.99 kg/m  General appearance: alert, cooperative, no distress and morbidly obese Head: Normocephalic, without obvious abnormality, atraumatic Eyes: conjunctivae/corneas clear. PERRL, EOM's intact. Fundi benign. Ears: normal TM and external ear canal right ear and abnormal external canal left ear - erythematous and tender tragus and external canal  Nose: Nares normal. Septum midline. Mucosa normal. No drainage or sinus tenderness. Throat: lips, mucosa, and tongue normal; teeth and gums normal Neck: no adenopathy, no JVD, supple, symmetrical, trachea midline and thyroid not enlarged, symmetric, no tenderness/mass/nodules Back: symmetric, no curvature. ROM normal. No CVA tenderness. Lungs: clear to auscultation bilaterally Heart: regular rate and rhythm, S1, S2 normal, no murmur, click, rub or gallop Abdomen: soft, non-tender; bowel sounds normal; no masses,  no organomegaly Extremities: extremities normal, atraumatic, no cyanosis or edema Pulses: 2+ and symmetric Skin: Skin color, texture, turgor normal. No rashes or lesions Lymph nodes: Cervical adenopathy: normal and Supraclavicular adenopathy: normal Neurologic: Grossly normal    Assessment:    Healthy female exam.     Plan:  -Discussed with patient recent lab  results, most are essentially within normal limits or stable from prior with the exception of lipid panel.  Bad cholesterol has increased from 158-186. Recommend to consider statin therapy if LDL fails to improve. TSH is mildly elevated and free T4 and T3 are within normal range indicating subclinical hypothyroidism.  Alkaline phosphatase mildly elevated.  Will repeat thyroid labs and CMP in 8 weeks. -UTD on mammogram, colonoscopy, Tdap, hep C screening.  Declined HIV screening.   Will request most recent Pap results from OB/GYN -Encouraged to resume weight loss efforts with dietary changes and physical activity. -Will place new referral to Neurology to evaluate LE weakness and paresthesia. 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 -Follow up in 8 weeks for Wt, LE weakness and labs (CMP, thyroid labs)   See After Visit Summary for Counseling Recommendations

## 2020-12-20 NOTE — Patient Instructions (Addendum)
Healthy Weight and Wellness   Preventive Care 98-53 Years Old, Female Preventive care refers to lifestyle choices and visits with your health care provider that can promote health and wellness. This includes:  A yearly physical exam. This is also called an annual wellness visit.  Regular dental and eye exams.  Immunizations.  Screening for certain conditions.  Healthy lifestyle choices, such as: ? Eating a healthy diet. ? Getting regular exercise. ? Not using drugs or products that contain nicotine and tobacco. ? Limiting alcohol use. What can I expect for my preventive care visit? Physical exam Your health care provider will check your:  Height and weight. These may be used to calculate your BMI (body mass index). BMI is a measurement that tells if you are at a healthy weight.  Heart rate and blood pressure.  Body temperature.  Skin for abnormal spots. Counseling Your health care provider may ask you questions about your:  Past medical problems.  Family's medical history.  Alcohol, tobacco, and drug use.  Emotional well-being.  Home life and relationship well-being.  Sexual activity.  Diet, exercise, and sleep habits.  Work and work Statistician.  Access to firearms.  Method of birth control.  Menstrual cycle.  Pregnancy history. What immunizations do I need? Vaccines are usually given at various ages, according to a schedule. Your health care provider will recommend vaccines for you based on your age, medical history, and lifestyle or other factors, such as travel or where you work.   What tests do I need? Blood tests  Lipid and cholesterol levels. These may be checked every 5 years, or more often if you are over 60 years old.  Hepatitis C test.  Hepatitis B test. Screening  Lung cancer screening. You may have this screening every year starting at age 82 if you have a 30-pack-year history of smoking and currently smoke or have quit within the past  15 years.  Colorectal cancer screening. ? All adults should have this screening starting at age 20 and continuing until age 57. ? Your health care provider may recommend screening at age 38 if you are at increased risk. ? You will have tests every 1-10 years, depending on your results and the type of screening test.  Diabetes screening. ? This is done by checking your blood sugar (glucose) after you have not eaten for a while (fasting). ? You may have this done every 1-3 years.  Mammogram. ? This may be done every 1-2 years. ? Talk with your health care provider about when you should start having regular mammograms. This may depend on whether you have a family history of breast cancer.  BRCA-related cancer screening. This may be done if you have a family history of breast, ovarian, tubal, or peritoneal cancers.  Pelvic exam and Pap test. ? This may be done every 3 years starting at age 60. ? Starting at age 61, this may be done every 5 years if you have a Pap test in combination with an HPV test. Other tests  STD (sexually transmitted disease) testing, if you are at risk.  Bone density scan. This is done to screen for osteoporosis. You may have this scan if you are at high risk for osteoporosis. Talk with your health care provider about your test results, treatment options, and if necessary, the need for more tests. Follow these instructions at home: Eating and drinking  Eat a diet that includes fresh fruits and vegetables, whole grains, lean protein, and low-fat dairy products.  Take vitamin and mineral supplements as recommended by your health care provider.  Do not drink alcohol if: ? Your health care provider tells you not to drink. ? You are pregnant, may be pregnant, or are planning to become pregnant.  If you drink alcohol: ? Limit how much you have to 0-1 drink a day. ? Be aware of how much alcohol is in your drink. In the [place] one drink equals one 12 oz bottle of beer  (355 mL), one 5 oz glass of wine (148 mL), or one 1 oz glass of hard liquor (44 mL).   Lifestyle  Take daily care of your teeth and gums. Brush your teeth every morning and night with fluoride toothpaste. Floss one time each day.  Stay active. Exercise for at least 30 minutes 5 or more days each week.  Do not use any products that contain nicotine or tobacco, such as cigarettes, e-cigarettes, and chewing tobacco. If you need help quitting, ask your health care provider.  Do not use drugs.  If you are sexually active, practice safe sex. Use a condom or other form of protection to prevent STIs (sexually transmitted infections).  If you do not wish to become pregnant, use a form of birth control. If you plan to become pregnant, see your health care provider for a prepregnancy visit.  If told by your health care provider, take low-dose aspirin daily starting at age 35.  Find healthy ways to cope with stress, such as: ? Meditation, yoga, or listening to music. ? Journaling. ? Talking to a trusted person. ? Spending time with friends and family. Safety  Always wear your seat belt while driving or riding in a vehicle.  Do not drive: ? If you have been drinking alcohol. Do not ride with someone who has been drinking. ? When you are tired or distracted. ? While texting.  Wear a helmet and other protective equipment during sports activities.  If you have firearms in your house, make sure you follow all gun safety procedures. What's next?  Visit your health care provider once a year for an annual wellness visit.  Ask your health care provider how often you should have your eyes and teeth checked.  Stay up to date on all vaccines. This information is not intended to replace advice given to you by your health care provider. Make sure you discuss any questions you have with your health care provider. Document Revised: 04/30/2020 Document Reviewed: 04/07/2018 Elsevier Patient Education   2021 [employer].

## 2021-01-07 ENCOUNTER — Ambulatory Visit (INDEPENDENT_AMBULATORY_CARE_PROVIDER_SITE_OTHER): Payer: Managed Care, Other (non HMO) | Admitting: Nurse Practitioner

## 2021-01-07 ENCOUNTER — Encounter: Payer: Self-pay | Admitting: Nurse Practitioner

## 2021-01-07 ENCOUNTER — Other Ambulatory Visit: Payer: Self-pay

## 2021-01-07 VITALS — BP 114/79 | HR 81 | Temp 97.7°F | Ht 63.0 in | Wt 239.5 lb

## 2021-01-07 DIAGNOSIS — H66002 Acute suppurative otitis media without spontaneous rupture of ear drum, left ear: Secondary | ICD-10-CM | POA: Diagnosis not present

## 2021-01-07 MED ORDER — AZITHROMYCIN 250 MG PO TABS: ORAL_TABLET | ORAL | 0 refills | Status: DC

## 2021-01-07 MED ORDER — NEOMYCIN-POLYMYXIN-HC 3.5-10000-1 OT SOLN: 4.0000 [drp] | Freq: Two times a day (BID) | OTIC | 0 refills | Status: DC

## 2021-01-07 NOTE — Patient Instructions (Signed)
Otitis Media, Adult  Otitis media is a condition in which the middle ear is red and swollen (inflamed) and full of fluid. The middle ear is the part of the ear that contains bones for hearing as well as air that helps send sounds to the brain. The condition usually goes away on its own. What are the causes? This condition is caused by a blockage in the eustachian tube. The eustachian tube connects the middle ear to the back of the nose. It normally allows air into the middle ear. The blockage is caused by fluid or swelling. Problems that can cause blockage include:  A cold or infection that affects the nose, mouth, or throat.  Allergies.  An irritant, such as tobacco smoke.  Adenoids that have become large. The adenoids are soft tissue located in the back of the throat, behind the nose and the roof of the mouth.  Growth or swelling in the upper part of the throat, just behind the nose (nasopharynx).  Damage to the ear caused by change in pressure. This is called barotrauma. What are the signs or symptoms? Symptoms of this condition include:  Ear pain.  Fever.  Problems with hearing.  Being tired.  Fluid leaking from the ear.  Ringing in the ear. How is this treated? This condition can go away on its own within 3-5 days. But if the condition is caused by bacteria or does not go away on its own, or if it keeps coming back, your doctor may:  Give you antibiotic medicines.  Give you medicines for pain. Follow these instructions at home:  Take over-the-counter and prescription medicines only as told by your doctor.  If you were prescribed an antibiotic medicine, take it as told by your doctor. Do not stop taking the antibiotic even if you start to feel better.  Keep all follow-up visits as told by your doctor. This is important. Contact a doctor if:  You have bleeding from your nose.  There is a lump on your neck.  You are not feeling better in 5 days.  You feel worse  instead of better. Get help right away if:  You have pain that is not helped with medicine.  You have swelling, redness, or pain around your ear.  You get a stiff neck.  You cannot move part of your face (paralysis).  You notice that the bone behind your ear hurts when you touch it.  You get a very bad headache. Summary  Otitis media means that the middle ear is red, swollen, and full of fluid.  This condition usually goes away on its own.  If the problem does not go away, treatment may be needed. You may be given medicines to treat the infection or to treat your pain.  If you were prescribed an antibiotic medicine, take it as told by your doctor. Do not stop taking the antibiotic even if you start to feel better.  Keep all follow-up visits as told by your doctor. This is important. This information is not intended to replace advice given to you by your health care provider. Make sure you discuss any questions you have with your health care provider. Document Revised: 06/29/2019 Document Reviewed: 06/29/2019 Elsevier Patient Education  2021 [employer].

## 2021-01-07 NOTE — Progress Notes (Signed)
Acute Office Visit  Subjective:    Patient ID: Denise Gamble, female    DOB: 1965/08/21, 55 y.o.   MRN: [id number]  Chief Complaint  Patient presents with  . Ear Pain    HPI Patient is in today for evaluation of left ear pain. She states that this initially started several months ago. She was seen for this about a month ago, as dizziness had started to develop. Ear canal was bulging and full. Was started on ofloxacin ear drops. She states that she used this for a few weeks and symptoms, including the dizziness did go away. She states that on Saturday, she started noticing some increased ear pain again. States that pain was throbbing. On Sunday, she also had some dizziness, though this has resolved. She states that she restarted using the drops as soon as symptoms started. This seems to have made the pain worse. She states that pain in the ear was so severe last night that it hurt her to sleep and she could not lay on the left side at all. She denies fever, headache, nasal or sinus congestion. She states that she no longer has any dizziness. She denies nausea or vomiting.   Past Medical History:  Diagnosis Date  . Allergy    seasonal allergies  . Asthma    adult onset (1998)  . GERD (gastroesophageal reflux disease)    for PRN use  . History of MRSA infection   . Miscarriage 2013   multiple   . Paresthesia of lower extremity   . Peripheral neuropathy 2019   left foot  . Plantar fasciitis of left foot   . Thoracic outlet syndrome   . Urinary incontinence     Past Surgical History:  Procedure Laterality Date  . DILATION AND CURETTAGE OF UTERUS  2011  . LYMPHADENECTOMY Left 1979  . WISDOM TOOTH EXTRACTION      Family History  Problem Relation Age of Onset  . Heart disease Mother 25       epstein anolmay   . Dementia Mother   . Colon polyps Mother   . Diverticulitis Mother   . Colon cancer Father 80  . Skin cancer Father        ? thinks melanoma?   . Leukemia Father         AAL  . Hyperlipidemia Father   . Hypertension Father   . Colon polyps Father 35  . Heart disease Brother   . Alcohol abuse Brother   . Diabetes Brother   . Hypertension Brother   . Prostate cancer Brother   . Coronary artery disease Brother   . Colon polyps Brother   . Hyperlipidemia Sister   . Hypertension Sister   . Stroke Sister 76  . Brain cancer Sister 60       mets from lung  . Lung cancer Sister 60       mets to brain-then stroke  . Heart attack Maternal Grandfather   . Hypertension Sister   . Hypertension Sister   . Colon polyps Brother 56  . Alzheimer's disease Maternal Aunt   . Breast cancer Neg Hx   . Esophageal cancer Neg Hx   . Stomach cancer Neg Hx   . Rectal cancer Neg Hx     Social History   Socioeconomic History  . Marital status: Married    Spouse name: Eddie Dibbles  . Number of children: 0  . Years of education: Not on file  . Highest education  level: Master's degree (e.g., MA, MS, MEng, MEd, MSW, MBA)  Occupational History  . Not on file  Tobacco Use  . Smoking status: Never Smoker  . Smokeless tobacco: Never Used  Vaping Use  . Vaping Use: Never used  Substance and Sexual Activity  . Alcohol use: No    Alcohol/week: 0.0 standard drinks    Comment: quit 2010  . Drug use: No  . Sexual activity: Yes    Comment: perimenopausal  Other Topics Concern  . Not on file  Social History Narrative   Works for [employer] to [employer] health   Married   No caffeine            Social Determinants of Radio broadcast assistant Strain: Not on file  Food Insecurity: Not on file  Transportation Needs: Not on file  Physical Activity: Not on file  Stress: Not on file  Social Connections: Not on file  Intimate Partner Violence: Not on file    Outpatient Medications Prior to Visit  Medication Sig Dispense Refill  . ADVAIR HFA 45-21 MCG/ACT inhaler Inhale 2 puffs into the lungs 2 (two) times daily. 1 Inhaler 2  . albuterol (VENTOLIN  HFA) 108 (90 Base) MCG/ACT inhaler USE 2 INHALATIONS EVERY 4 HOURS AS NEEDED FOR WHEEZING OR SHORTNESS OF BREATH 17 g 1  . cetirizine (ZYRTEC) 10 MG tablet Take 10 mg by mouth.    . cholecalciferol (VITAMIN D3) 25 MCG (1000 UNIT) tablet Take 1,000 Units by mouth daily.    . folic acid (FOLVITE) 1 MG tablet Take 1 mg by mouth daily.    Marland Kitchen ibuprofen (ADVIL,MOTRIN) 200 MG tablet Take 200 mg by mouth every 6 (six) hours as needed.    . magnesium oxide (MAG-OX) 400 MG tablet Take 400 mg by mouth daily.    . montelukast (SINGULAIR) 10 MG tablet TAKE 1 TABLET AT BEDTIME 90 tablet 0  . pyridOXINE (VITAMIN B-6) 100 MG tablet Take 100 mg by mouth daily.    . vitamin B-12 (CYANOCOBALAMIN) 100 MCG tablet Take 100 mcg by mouth daily.    Marland Kitchen ofloxacin (FLOXIN) 0.3 % OTIC solution Place 10 drops into the left ear daily. Use for 7 days. 5 mL 0   Facility-Administered Medications Prior to Visit  Medication Dose Route Frequency Provider Last Rate Last Admin  . doxycycline (VIBRA-TABS) tablet 100 mg  100 mg Oral Q12H Fransico Meadow, [place]        Allergies  Allergen Reactions  . Pollen Extract Hives    All tree pollens and dust mites  . Latex Hives    Review of Systems  Constitutional: Negative for activity change, chills, fatigue and fever.  HENT: Positive for ear pain and hearing loss. Negative for congestion, ear discharge, postnasal drip, rhinorrhea, sinus pressure and tinnitus.   Eyes: Negative.   Respiratory: Negative for cough, chest tightness, shortness of breath and wheezing.   Cardiovascular: Negative for chest pain and palpitations.  Gastrointestinal: Negative for constipation, diarrhea, nausea and vomiting.  Endocrine: Negative.   Musculoskeletal: Negative for arthralgias, back pain, myalgias and neck pain.  Skin: Negative for rash.  Allergic/Immunologic: Positive for environmental allergies.  Neurological: Negative for dizziness, weakness and headaches.  Hematological: Negative.    Psychiatric/Behavioral: Positive for sleep disturbance. The patient is not nervous/anxious.        Sleep disturbance due to ear pain.   All other systems reviewed and are negative.      Objective:  Physical Exam Vitals and nursing note reviewed.  Constitutional:      Appearance: Normal appearance. She is well-developed. She is obese.  HENT:     Head: Normocephalic and atraumatic.     Right Ear: Ear canal and external ear normal.     Left Ear: Swelling and tenderness present. Tympanic membrane is erythematous and bulging.     Nose: No congestion.     Right Turbinates: Not enlarged or swollen.     Left Turbinates: Not enlarged or swollen.     Right Sinus: No maxillary sinus tenderness or frontal sinus tenderness.     Left Sinus: No maxillary sinus tenderness or frontal sinus tenderness.     Mouth/Throat:     Mouth: Mucous membranes are moist.     Pharynx: Oropharynx is clear. Uvula midline.  Eyes:     Pupils: Pupils are equal, round, and reactive to light.  Cardiovascular:     Rate and Rhythm: Normal rate and regular rhythm.     Pulses: Normal pulses.     Heart sounds: Normal heart sounds.  Pulmonary:     Effort: Pulmonary effort is normal.     Breath sounds: Normal breath sounds.  Abdominal:     Palpations: Abdomen is soft.  Musculoskeletal:        General: Normal range of motion.     Cervical back: Normal range of motion and neck supple.  Lymphadenopathy:     Cervical: No cervical adenopathy.  Skin:    General: Skin is warm and dry.     Capillary Refill: Capillary refill takes less than 2 seconds.  Neurological:     General: No focal deficit present.     Mental Status: She is alert and oriented to person, place, and time.  Psychiatric:        Mood and Affect: Mood normal.        Behavior: Behavior normal.        Thought Content: Thought content normal.        Judgment: Judgment normal.     Today's Vitals   01/07/21 1021  BP: 114/79  Pulse: 81  Temp: 97.7  F (36.5 C)  SpO2: 97%  Weight: 239 lb 8 oz (108.6 kg)  Height: 5' 3"  (1.6 m)   Body mass index is 42.43 kg/m.   Wt Readings from Last 3 Encounters:  01/07/21 239 lb 8 oz (108.6 kg)  12/20/20 242 lb 11.2 oz (110.1 kg)  07/22/20 231 lb (104.8 kg)    Health Maintenance Due  Topic Date Due  . PAP SMEAR-Modifier  12/15/2019  . COVID-19 Vaccine (2 - Booster for [employer] series) 01/13/2020  . Zoster Vaccines- Shingrix (2 of 2) 02/07/2020    There are no preventive care reminders to display for this patient.   Lab Results  Component Value Date   TSH 5.450 (H) 12/16/2020   Lab Results  Component Value Date   WBC 6.1 12/16/2020   HGB 13.2 12/16/2020   HCT 40.0 12/16/2020   MCV 93 12/16/2020   PLT 275 12/16/2020   Lab Results  Component Value Date   NA 141 12/16/2020   K 4.7 12/16/2020   CO2 24 12/16/2020   GLUCOSE 89 12/16/2020   BUN 14 12/16/2020   CREATININE 0.84 12/16/2020   BILITOT 0.4 12/16/2020   ALKPHOS 138 (H) 12/16/2020   AST 21 12/16/2020   ALT 15 12/16/2020   PROT 7.2 12/16/2020   ALBUMIN 4.0 12/16/2020   CALCIUM 9.1 12/16/2020  ANIONGAP 4 (L) 09/27/2018   EGFR 84 12/16/2020   GFR 76.70 12/14/2016   Lab Results  Component Value Date   CHOL 249 (H) 12/16/2020   Lab Results  Component Value Date   HDL 45 12/16/2020   Lab Results  Component Value Date   LDLCALC 186 (H) 12/16/2020   Lab Results  Component Value Date   TRIG 101 12/16/2020   Lab Results  Component Value Date   CHOLHDL 5.5 (H) 12/16/2020   Lab Results  Component Value Date   HGBA1C 5.6 12/16/2020       Assessment & Plan:  1. Non-recurrent acute suppurative otitis media of left ear without spontaneous rupture of tympanic membrane Start z-pack. Take as directed for 5 days. Change ear drops to cortisporin ear drops. Use four drops in left ear twice daily for next week. Apply warm compress on ear to help relieve pain. Take OTC tylenol or ibuprofen to help reduce pain and any  fever.  - azithromycin (ZITHROMAX) 250 MG tablet; z-pack - take as directed for 5 days  Dispense: 6 tablet; Refill: 0 - neomycin-polymyxin-hydrocortisone (CORTISPORIN) OTIC solution; Place 4 drops into the left ear 2 (two) times daily.  Dispense: 10 mL; Refill: 0  Problem List Items Addressed This Visit      Nervous and Auditory   Non-recurrent acute suppurative otitis media of left ear without spontaneous rupture of tympanic membrane - Primary   Relevant Medications   azithromycin (ZITHROMAX) 250 MG tablet   neomycin-polymyxin-hydrocortisone (CORTISPORIN) OTIC solution       Meds ordered this encounter  Medications  . azithromycin (ZITHROMAX) 250 MG tablet    Sig: z-pack - take as directed for 5 days    Dispense:  6 tablet    Refill:  0    Order Specific Question:   Supervising Provider    Answer:   Beatrice Lecher D [[id number]]  . neomycin-polymyxin-hydrocortisone (CORTISPORIN) OTIC solution    Sig: Place 4 drops into the left ear 2 (two) times daily.    Dispense:  10 mL    Refill:  0    Order Specific Question:   Supervising Provider    Answer:   Beatrice Lecher D [[id number]]     Ronnell Freshwater, NP

## 2021-02-05 ENCOUNTER — Other Ambulatory Visit: Payer: Self-pay | Admitting: Physician Assistant

## 2021-02-05 DIAGNOSIS — J302 Other seasonal allergic rhinitis: Secondary | ICD-10-CM

## 2021-02-11 ENCOUNTER — Other Ambulatory Visit: Payer: Self-pay | Admitting: Physician Assistant

## 2021-02-11 DIAGNOSIS — E038 Other specified hypothyroidism: Secondary | ICD-10-CM

## 2021-02-11 DIAGNOSIS — Z0000 Encounter for general adult medical examination without abnormal findings: Secondary | ICD-10-CM

## 2021-02-12 ENCOUNTER — Other Ambulatory Visit: Payer: Managed Care, Other (non HMO)

## 2021-02-12 ENCOUNTER — Other Ambulatory Visit: Payer: Self-pay

## 2021-02-12 DIAGNOSIS — E038 Other specified hypothyroidism: Secondary | ICD-10-CM

## 2021-02-12 DIAGNOSIS — Z0000 Encounter for general adult medical examination without abnormal findings: Secondary | ICD-10-CM

## 2021-02-13 LAB — COMPREHENSIVE METABOLIC PANEL
ALT: 12 IU/L (ref 0–32)
AST: 15 IU/L (ref 0–40)
Albumin/Globulin Ratio: 1.8 (ref 1.2–2.2)
Albumin: 4.2 g/dL (ref 3.8–4.9)
Alkaline Phosphatase: 123 IU/L — ABNORMAL HIGH (ref 44–121)
BUN/Creatinine Ratio: 14 (ref 9–23)
BUN: 12 mg/dL (ref 6–24)
Bilirubin Total: 0.4 mg/dL (ref 0.0–1.2)
CO2: 24 mmol/L (ref 20–29)
Calcium: 9 mg/dL (ref 8.7–10.2)
Chloride: 102 mmol/L (ref 96–106)
Creatinine, Ser: 0.83 mg/dL (ref 0.57–1.00)
Globulin, Total: 2.4 g/dL (ref 1.5–4.5)
Glucose: 89 mg/dL (ref 65–99)
Potassium: 4.4 mmol/L (ref 3.5–5.2)
Sodium: 139 mmol/L (ref 134–144)
Total Protein: 6.6 g/dL (ref 6.0–8.5)
eGFR: 85 mL/min/{1.73_m2} (ref >59)

## 2021-02-13 LAB — T3: T3, Total: 106 ng/dL (ref 71–180)

## 2021-02-13 LAB — TSH: TSH: 3.66 u[IU]/mL (ref 0.450–4.500)

## 2021-02-13 LAB — T4, FREE: Free T4: 0.99 ng/dL (ref 0.82–1.77)

## 2021-02-14 ENCOUNTER — Ambulatory Visit (INDEPENDENT_AMBULATORY_CARE_PROVIDER_SITE_OTHER): Payer: Managed Care, Other (non HMO) | Admitting: Physician Assistant

## 2021-02-14 ENCOUNTER — Other Ambulatory Visit: Payer: Self-pay

## 2021-02-14 ENCOUNTER — Encounter: Payer: Self-pay | Admitting: Physician Assistant

## 2021-02-14 VITALS — BP 120/75 | HR 72 | Temp 98.4°F | Ht 63.0 in | Wt 239.0 lb

## 2021-02-14 DIAGNOSIS — E038 Other specified hypothyroidism: Secondary | ICD-10-CM | POA: Diagnosis not present

## 2021-02-14 DIAGNOSIS — E785 Hyperlipidemia, unspecified: Secondary | ICD-10-CM

## 2021-02-14 DIAGNOSIS — R29898 Other symptoms and signs involving the musculoskeletal system: Secondary | ICD-10-CM

## 2021-02-14 DIAGNOSIS — R7989 Other specified abnormal findings of blood chemistry: Secondary | ICD-10-CM | POA: Diagnosis not present

## 2021-02-14 NOTE — Patient Instructions (Signed)
Heart-Healthy Eating Plan Many factors influence your heart (coronary) health, including eating and exercise habits. Coronary risk increases with abnormal blood fat (lipid) levels. Heart-healthy meal planning includes limiting unhealthy fats,increasing healthy fats, and making other diet and lifestyle changes. What is my plan? Your health care provider may recommend that you: Limit your fat intake to _________% or less of your total calories each day. Limit your saturated fat intake to _________% or less of your total calories each day. Limit the amount of cholesterol in your diet to less than _________ mg per day. What are tips for following this plan? Cooking Cook foods using methods other than frying. Baking, boiling, grilling, and broiling are all good options. Other ways to reduce fat include: Removing the skin from poultry. Removing all visible fats from meats. Steaming vegetables in water or broth. Meal planning  At meals, imagine dividing your plate into fourths: Fill one-half of your plate with vegetables and green salads. Fill one-fourth of your plate with whole grains. Fill one-fourth of your plate with lean protein foods. Eat 4-5 servings of vegetables per day. One serving equals 1 cup raw or cooked vegetable, or 2 cups raw leafy greens. Eat 4-5 servings of fruit per day. One serving equals 1 medium whole fruit,  cup dried fruit,  cup fresh, frozen, or canned fruit, or  cup 100% fruit juice. Eat more foods that contain soluble fiber. Examples include apples, broccoli, carrots, beans, peas, and barley. Aim to get 25-30 g of fiber per day. Increase your consumption of legumes, nuts, and seeds to 4-5 servings per week. One serving of dried beans or legumes equals  cup cooked, 1 serving of nuts is  cup, and 1 serving of seeds equals 1 tablespoon.  Fats Choose healthy fats more often. Choose monounsaturated and polyunsaturated fats, such as olive and canola oils, flaxseeds,  walnuts, almonds, and seeds. Eat more omega-3 fats. Choose salmon, mackerel, sardines, tuna, flaxseed oil, and ground flaxseeds. Aim to eat fish at least 2 times each week. Check food labels carefully to identify foods with trans fats or high amounts of saturated fat. Limit saturated fats. These are found in animal products, such as meats, butter, and cream. Plant sources of saturated fats include palm oil, palm kernel oil, and coconut oil. Avoid foods with partially hydrogenated oils in them. These contain trans fats. Examples are stick margarine, some tub margarines, cookies, crackers, and other baked goods. Avoid fried foods. General information Eat more home-cooked food and less restaurant, buffet, and fast food. Limit or avoid alcohol. Limit foods that are high in starch and sugar. Lose weight if you are overweight. Losing just 5-10% of your body weight can help your overall health and prevent diseases such as diabetes and heart disease. Monitor your salt (sodium) intake, especially if you have high blood pressure. Talk with your health care provider about your sodium intake. Try to incorporate more vegetarian meals weekly. What foods can I eat? Fruits All fresh, canned (in natural juice), or frozen fruits. Vegetables Fresh or frozen vegetables (raw, steamed, roasted, or grilled). Green salads. Grains Most grains. Choose whole wheat and whole grains most of the time. Rice andpasta, including brown rice and pastas made with whole wheat. Meats and other proteins Lean, well-trimmed beef, veal, pork, and lamb. Chicken and [place] without skin. All fish and shellfish. Wild duck, rabbit, pheasant, and venison. Egg whites or low-cholesterol egg substitutes. Dried beans, peas, lentils, and tofu. Seedsand most nuts. Dairy Low-fat or nonfat cheeses, including ricotta  and mozzarella. Skim or 1% milk (liquid, powdered, or evaporated). Buttermilk made with low-fat milk. Nonfat orlow-fat yogurt. Fats  and oils Non-hydrogenated (trans-free) margarines. Vegetable oils, including soybean, sesame, sunflower, olive, peanut, safflower, corn, canola, and cottonseed. Salad dressings or mayonnaisemade with a vegetable oil. Beverages Water (mineral or sparkling). Coffee and tea. Diet carbonated beverages. Sweets and desserts Sherbet, gelatin, and fruit ice. Small amounts of dark chocolate. Limit all sweets and desserts. Seasonings and condiments All seasonings and condiments. The items listed above may not be a complete list of foods and beverages you can eat. Contact a dietitian for more options. What foods are not recommended? Fruits Canned fruit in heavy syrup. Fruit in cream or butter sauce. Fried fruit. Limitcoconut. Vegetables Vegetables cooked in cheese, cream, or butter sauce. Fried vegetables. Grains Breads made with saturated or trans fats, oils, or whole milk. Croissants. Sweet rolls. Donuts. High-fat crackers,such as cheese crackers. Meats and other proteins Fatty meats, such as hot dogs, ribs, sausage, bacon, rib-eye roast or steak. High-fat deli meats, such as salami and bologna. Caviar. Domestic duck andgoose. Organ meats, such as liver. Dairy Cream, sour cream, cream cheese, and creamed cottage cheese. Whole milk cheeses. Whole or 2% milk (liquid, evaporated, or condensed). Whole buttermilk.Cream sauce or high-fat cheese sauce. Whole-milk yogurt. Fats and oils Meat fat, or shortening. Cocoa butter, hydrogenated oils, palm oil, coconut oil, palm kernel oil. Solid fats and shortenings, including bacon fat, salt pork, lard, and butter. Nondairy cream substitutes. Salad dressings with cheeseor sour cream. Beverages Regular sodas and any drinks with added sugar. Sweets and desserts Frosting. Pudding. Cookies. Cakes. Pies. Milk chocolate or white chocolate.Buttered syrups. Full-fat ice cream or ice cream drinks. The items listed above may not be a complete list of foods and beverages to  avoid. Contact a dietitian for more information. Summary Heart-healthy meal planning includes limiting unhealthy fats, increasing healthy fats, and making other diet and lifestyle changes. Lose weight if you are overweight. Losing just 5-10% of your body weight can help your overall health and prevent diseases such as diabetes and heart disease. Focus on eating a balance of foods, including fruits and vegetables, low-fat or nonfat dairy, lean protein, nuts and legumes, whole grains, and heart-healthy oils and fats. This information is not intended to replace advice given to you by your health care provider. Make sure you discuss any questions you have with your healthcare provider. Document Revised: 09/03/2017 Document Reviewed: 09/03/2017 Elsevier Patient Education  2022 [employer].

## 2021-02-14 NOTE — Progress Notes (Signed)
Established Patient Office Visit  Subjective:  Patient ID: Denise Gamble, female    DOB: 10/07/1965  Age: 55 y.o. MRN: [id number]  CC:  Chief Complaint  Patient presents with   Follow-up     HPI Denise Gamble presents for follow on weight and lower extremity weakness. Patient reports continues to work on weight loss. Is trying to follow a plant based diet and reduce saturated/trans fats intake. Looked into weight loss program ([facility]) and does not feel that is a good fit for her. States in the past was able to walk 10,000 steps daily without issues which she no longer is able to do. States if she tries to get in 10,000 steps she will start to have joint pain in multiple areas including her hip and ankle. Patient completed PT for hip pain and lumbar radicular pain. Continues to have lower extremity numbness and tingling sensation which has noticed some improvement since PT. States homocysteine was abnormal when checked by neurology and inquiring about repeating lab.    Past Medical History:  Diagnosis Date   Allergy    seasonal allergies   Asthma    adult onset (1998)   GERD (gastroesophageal reflux disease)    for PRN use   History of MRSA infection    Miscarriage 2013   multiple    Paresthesia of lower extremity    Peripheral neuropathy 2019   left foot   Plantar fasciitis of left foot    Thoracic outlet syndrome    Urinary incontinence     Past Surgical History:  Procedure Laterality Date   DILATION AND CURETTAGE OF UTERUS  2011   LYMPHADENECTOMY Left 1979   WISDOM TOOTH EXTRACTION      Family History  Problem Relation Age of Onset   Heart disease Mother 64       epstein anolmay    Dementia Mother    Colon polyps Mother    Diverticulitis Mother    Colon cancer Father 42   Skin cancer Father        ? thinks melanoma?    Leukemia Father        AAL   Hyperlipidemia Father    Hypertension Father    Colon polyps Father 33   Heart  disease Brother    Alcohol abuse Brother    Diabetes Brother    Hypertension Brother    Prostate cancer Brother    Coronary artery disease Brother    Colon polyps Brother    Hyperlipidemia Sister    Hypertension Sister    Stroke Sister 61   Brain cancer Sister 44       mets from lung   Lung cancer Sister 74       mets to brain-then stroke   Heart attack Maternal Grandfather    Hypertension Sister    Hypertension Sister    Colon polyps Brother 27   Alzheimer's disease Maternal Aunt    Breast cancer Neg Hx    Esophageal cancer Neg Hx    Stomach cancer Neg Hx    Rectal cancer Neg Hx     Social History   Socioeconomic History   Marital status: Married    Spouse name: Eddie Dibbles   Number of children: 0   Years of education: Not on file   Highest education level: Master's degree (e.g., MA, MS, MEng, MEd, MSW, MBA)  Occupational History   Not on file  Tobacco Use   Smoking status: Never  Smokeless tobacco: Never  Vaping Use   Vaping Use: Never used  Substance and Sexual Activity   Alcohol use: No    Alcohol/week: 0.0 standard drinks    Comment: quit 2010   Drug use: No   Sexual activity: Yes    Comment: perimenopausal  Other Topics Concern   Not on file  Social History Narrative   Works for [employer] to [employer] health   Married   No caffeine            Social Determinants of Radio broadcast assistant Strain: Not on file  Food Insecurity: Not on file  Transportation Needs: Not on file  Physical Activity: Not on file  Stress: Not on file  Social Connections: Not on file  Intimate Partner Violence: Not on file    Outpatient Medications Prior to Visit  Medication Sig Dispense Refill   ADVAIR HFA 45-21 MCG/ACT inhaler Inhale 2 puffs into the lungs 2 (two) times daily. (Patient taking differently: Inhale 2 puffs into the lungs 2 (two) times daily as needed.) 1 Inhaler 2   albuterol (VENTOLIN HFA) 108 (90 Base) MCG/ACT inhaler USE 2 INHALATIONS  EVERY 4 HOURS AS NEEDED FOR WHEEZING OR SHORTNESS OF BREATH 17 g 1   cetirizine (ZYRTEC) 10 MG tablet Take 10 mg by mouth.     cholecalciferol (VITAMIN D3) 25 MCG (1000 UNIT) tablet Take 1,000 Units by mouth daily.     montelukast (SINGULAIR) 10 MG tablet TAKE 1 TABLET AT BEDTIME 90 tablet 0   vitamin B-12 (CYANOCOBALAMIN) 100 MCG tablet Take 100 mcg by mouth daily.     magnesium oxide (MAG-OX) 400 MG tablet Take 400 mg by mouth daily.     azithromycin (ZITHROMAX) 250 MG tablet z-pack - take as directed for 5 days 6 tablet 0   folic acid (FOLVITE) 1 MG tablet Take 1 mg by mouth daily.     ibuprofen (ADVIL,MOTRIN) 200 MG tablet Take 200 mg by mouth every 6 (six) hours as needed.     neomycin-polymyxin-hydrocortisone (CORTISPORIN) OTIC solution Place 4 drops into the left ear 2 (two) times daily. 10 mL 0   pyridOXINE (VITAMIN B-6) 100 MG tablet Take 100 mg by mouth daily.     Facility-Administered Medications Prior to Visit  Medication Dose Route Frequency Provider Last Rate Last Admin   doxycycline (VIBRA-TABS) tablet 100 mg  100 mg Oral Q12H Sofia, Leslie K, PA-C        Allergies  Allergen Reactions   Pollen Extract Hives    All tree pollens and dust mites   Latex Hives    ROS Review of Systems A fourteen system review of systems was performed and found to be positive as per HPI.   Objective:    Physical Exam General:  Well Developed, well nourished, in no acute distress  Neuro:  Alert and oriented,  extra-ocular muscles intact  HEENT:  Normocephalic, atraumatic, neck supple Skin:  no gross rash, warm, pink. Cardiac:  RRR Respiratory:  ECTA B/L, Not using accessory muscles, speaking in full sentences- unlabored. Vascular:  Ext warm, no cyanosis apprec.; cap RF less 2 sec. Psych:  No HI/SI, judgement and insight good, Euthymic mood. Full Affect.  BP 120/75   Pulse 72   Temp 98.4 F (36.9 C)   Ht 5' 3" (1.6 m)   Wt 239 lb (108.4 kg)   LMP 03/29/2019   SpO2 98%   BMI  42.34 kg/m  Wt Readings from Last  3 Encounters:  02/14/21 239 lb (108.4 kg)  01/07/21 239 lb 8 oz (108.6 kg)  12/20/20 242 lb 11.2 oz (110.1 kg)     Health Maintenance Due  Topic Date Due   PAP SMEAR-Modifier  12/15/2019   COVID-19 Vaccine (2 - Booster for Janssen series) 01/13/2020   Zoster Vaccines- Shingrix (2 of 2) 02/07/2020    There are no preventive care reminders to display for this patient.  Lab Results  Component Value Date   TSH 3.660 02/12/2021   Lab Results  Component Value Date   WBC 6.1 12/16/2020   HGB 13.2 12/16/2020   HCT 40.0 12/16/2020   MCV 93 12/16/2020   PLT 275 12/16/2020   Lab Results  Component Value Date   NA 139 02/12/2021   K 4.4 02/12/2021   CO2 24 02/12/2021   GLUCOSE 89 02/12/2021   BUN 12 02/12/2021   CREATININE 0.83 02/12/2021   BILITOT 0.4 02/12/2021   ALKPHOS 123 (H) 02/12/2021   AST 15 02/12/2021   ALT 12 02/12/2021   PROT 6.6 02/12/2021   ALBUMIN 4.2 02/12/2021   CALCIUM 9.0 02/12/2021   ANIONGAP 4 (L) 09/27/2018   EGFR 85 02/12/2021   GFR 76.70 12/14/2016   Lab Results  Component Value Date   CHOL 249 (H) 12/16/2020   Lab Results  Component Value Date   HDL 45 12/16/2020   Lab Results  Component Value Date   LDLCALC 186 (H) 12/16/2020   Lab Results  Component Value Date   TRIG 101 12/16/2020   Lab Results  Component Value Date   CHOLHDL 5.5 (H) 12/16/2020   Lab Results  Component Value Date   HGBA1C 5.6 12/16/2020      Assessment & Plan:   Problem List Items Addressed This Visit       Other   Morbid obesity ([place])   Hyperlipidemia   Other Visit Diagnoses     Subclinical hypothyroidism    -  Primary   Weakness of right lower extremity       Elevated homocysteine          Hyperlipidemia: -Last lipid panel: total cholesterol 249, triglycerides 101. HDL 45, LDL 186. The 10-year ASCVD risk score Mikey Bussing DC Brooke Bonito., et al., 2013) is: 2.2% -Encourage to continue weight loss efforts and follow a low  fat diet. -Will continue to monitor and repeat lipid panel at follow visit.  Subclinical hypothyroidism: -Recent thyroid labs wnl's, TSH normalized. -Will continue to monitor.  Morbid obesity: -Associated with hyperlipidemia. -Encourage to continue weight loss efforts with dietary changes and increase physical activity.  Weakness of right lower extremity: -Patient has completed PT and been evaluated by Neurology for paresthesia with no clear etiology for symptoms. Slight improvement. 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 Patient verbalized understanding.  Elevated homocysteine: -Homocysteine mildly elevated (15.4) 06/11/2020, will repeat with lab visit in 6 months.  Discussed recent CMP, alk phosphatase improved, other parameters normal.  No orders of the defined types were placed in this encounter.   Follow-up: Return in about 6 months (around 08/17/2021) for HLD, allergies, wt and FBW (include homocysteine, vit b6, b12/folate, Vit D).   Note:  This note was prepared with assistance of Dragon voice recognition software. Occasional wrong-word or sound-a-like substitutions may have occurred due to the inherent limitations of voice recognition software.  [name], PA-C

## 2021-02-19 ENCOUNTER — Other Ambulatory Visit: Payer: Managed Care, Other (non HMO)

## 2021-05-06 ENCOUNTER — Other Ambulatory Visit: Payer: Self-pay | Admitting: Physician Assistant

## 2021-05-06 DIAGNOSIS — J302 Other seasonal allergic rhinitis: Secondary | ICD-10-CM

## 2021-06-03 ENCOUNTER — Other Ambulatory Visit: Payer: Self-pay | Admitting: Physician Assistant

## 2021-06-03 DIAGNOSIS — J4520 Mild intermittent asthma, uncomplicated: Secondary | ICD-10-CM

## 2021-06-09 DIAGNOSIS — Z23 Encounter for immunization: Secondary | ICD-10-CM | POA: Diagnosis not present

## 2021-08-04 ENCOUNTER — Other Ambulatory Visit: Payer: Self-pay | Admitting: Physician Assistant

## 2021-08-04 DIAGNOSIS — J302 Other seasonal allergic rhinitis: Secondary | ICD-10-CM

## 2021-08-10 DIAGNOSIS — K900 Celiac disease: Secondary | ICD-10-CM

## 2021-08-10 HISTORY — DX: Celiac disease: K90.0

## 2021-08-15 ENCOUNTER — Other Ambulatory Visit: Payer: Managed Care, Other (non HMO)

## 2021-08-20 ENCOUNTER — Ambulatory Visit: Payer: Managed Care, Other (non HMO) | Admitting: Physician Assistant

## 2021-09-11 ENCOUNTER — Observation Stay (HOSPITAL_BASED_OUTPATIENT_CLINIC_OR_DEPARTMENT_OTHER): Payer: Managed Care, Other (non HMO)

## 2021-09-11 ENCOUNTER — Emergency Department (HOSPITAL_COMMUNITY): Payer: Managed Care, Other (non HMO)

## 2021-09-11 ENCOUNTER — Other Ambulatory Visit: Payer: Self-pay

## 2021-09-11 ENCOUNTER — Encounter (HOSPITAL_COMMUNITY): Payer: Self-pay

## 2021-09-11 ENCOUNTER — Observation Stay (HOSPITAL_COMMUNITY): Payer: Managed Care, Other (non HMO)

## 2021-09-11 ENCOUNTER — Observation Stay (HOSPITAL_COMMUNITY)
Admission: EM | Admit: 2021-09-11 | Discharge: 2021-09-12 | Disposition: A | Discharge status: Home / Self Care | Payer: Managed Care, Other (non HMO) | Attending: Family Medicine | Admitting: Family Medicine

## 2021-09-11 DIAGNOSIS — R200 Anesthesia of skin: Secondary | ICD-10-CM

## 2021-09-11 DIAGNOSIS — Z79899 Other long term (current) drug therapy: Secondary | ICD-10-CM | POA: Diagnosis not present

## 2021-09-11 DIAGNOSIS — J45909 Unspecified asthma, uncomplicated: Secondary | ICD-10-CM | POA: Diagnosis not present

## 2021-09-11 DIAGNOSIS — G459 Transient cerebral ischemic attack, unspecified: Principal | ICD-10-CM | POA: Insufficient documentation

## 2021-09-11 DIAGNOSIS — R202 Paresthesia of skin: Secondary | ICD-10-CM

## 2021-09-11 DIAGNOSIS — R531 Weakness: Secondary | ICD-10-CM

## 2021-09-11 DIAGNOSIS — G629 Polyneuropathy, unspecified: Secondary | ICD-10-CM

## 2021-09-11 DIAGNOSIS — E785 Hyperlipidemia, unspecified: Secondary | ICD-10-CM | POA: Diagnosis present

## 2021-09-11 DIAGNOSIS — Z20822 Contact with and (suspected) exposure to covid-19: Secondary | ICD-10-CM | POA: Diagnosis not present

## 2021-09-11 DIAGNOSIS — Z91040 Latex allergy status: Secondary | ICD-10-CM | POA: Diagnosis not present

## 2021-09-11 DIAGNOSIS — G540 Brachial plexus disorders: Secondary | ICD-10-CM | POA: Diagnosis present

## 2021-09-11 DIAGNOSIS — R29701 NIHSS score 1: Secondary | ICD-10-CM

## 2021-09-11 HISTORY — DX: Transient cerebral ischemic attack, unspecified: G45.9

## 2021-09-11 LAB — COMPREHENSIVE METABOLIC PANEL
ALT: 15 U/L (ref 0–44)
AST: 20 U/L (ref 15–41)
Albumin: 3.6 g/dL (ref 3.5–5.0)
Alkaline Phosphatase: 102 U/L (ref 38–126)
Anion gap: 10 (ref 5–15)
BUN: 11 mg/dL (ref 6–20)
CO2: 23 mmol/L (ref 22–32)
Calcium: 8.8 mg/dL — ABNORMAL LOW (ref 8.9–10.3)
Chloride: 106 mmol/L (ref 98–111)
Creatinine, Ser: 0.9 mg/dL (ref 0.44–1.00)
GFR, Estimated: >60 mL/min (ref >60)
Glucose, Bld: 90 mg/dL (ref 70–99)
Potassium: 3.8 mmol/L (ref 3.5–5.1)
Sodium: 139 mmol/L (ref 135–145)
Total Bilirubin: 0.5 mg/dL (ref 0.3–1.2)
Total Protein: 7 g/dL (ref 6.5–8.1)

## 2021-09-11 LAB — DIFFERENTIAL
Abs Immature Granulocytes: 0.02 10*3/uL (ref 0.00–0.07)
Basophils Absolute: 0.1 10*3/uL (ref 0.0–0.1)
Basophils Relative: 1 %
Eosinophils Absolute: 0.3 10*3/uL (ref 0.0–0.5)
Eosinophils Relative: 4 %
Immature Granulocytes: 0 %
Lymphocytes Relative: 29 %
Lymphs Abs: 2 10*3/uL (ref 0.7–4.0)
Monocytes Absolute: 0.4 10*3/uL (ref 0.1–1.0)
Monocytes Relative: 6 %
Neutro Abs: 4.2 10*3/uL (ref 1.7–7.7)
Neutrophils Relative %: 60 %

## 2021-09-11 LAB — URINALYSIS, ROUTINE W REFLEX MICROSCOPIC
Bilirubin Urine: NEGATIVE
Glucose, UA: NEGATIVE mg/dL
Ketones, ur: NEGATIVE mg/dL
Leukocytes,Ua: NEGATIVE
Nitrite: NEGATIVE
Protein, ur: NEGATIVE mg/dL
Specific Gravity, Urine: <1.005 — ABNORMAL LOW (ref 1.005–1.030)
pH: 6 (ref 5.0–8.0)

## 2021-09-11 LAB — I-STAT CHEM 8, ED
BUN: 12 mg/dL (ref 6–20)
Calcium, Ion: 1.05 mmol/L — ABNORMAL LOW (ref 1.15–1.40)
Chloride: 105 mmol/L (ref 98–111)
Creatinine, Ser: 0.8 mg/dL (ref 0.44–1.00)
Glucose, Bld: 90 mg/dL (ref 70–99)
HCT: 42 % (ref 36.0–46.0)
Hemoglobin: 14.3 g/dL (ref 12.0–15.0)
Potassium: 3.6 mmol/L (ref 3.5–5.1)
Sodium: 141 mmol/L (ref 135–145)
TCO2: 26 mmol/L (ref 22–32)

## 2021-09-11 LAB — CBC
HCT: 42 % (ref 36.0–46.0)
Hemoglobin: 13.3 g/dL (ref 12.0–15.0)
MCH: 31.1 pg (ref 26.0–34.0)
MCHC: 31.7 g/dL (ref 30.0–36.0)
MCV: 98.4 fL (ref 80.0–100.0)
Platelets: 253 10*3/uL (ref 150–400)
RBC: 4.27 MIL/uL (ref 3.87–5.11)
RDW: 13.4 % (ref 11.5–15.5)
WBC: 7 10*3/uL (ref 4.0–10.5)
nRBC: 0 % (ref 0.0–0.2)

## 2021-09-11 LAB — I-STAT BETA HCG BLOOD, ED (MC, WL, AP ONLY): I-stat hCG, quantitative: 10.1 m[IU]/mL — ABNORMAL HIGH (ref <5)

## 2021-09-11 LAB — RAPID URINE DRUG SCREEN, HOSP PERFORMED
Amphetamines: NOT DETECTED
Barbiturates: NOT DETECTED
Benzodiazepines: NOT DETECTED
Cocaine: NOT DETECTED
Opiates: NOT DETECTED
Tetrahydrocannabinol: NOT DETECTED

## 2021-09-11 LAB — URINALYSIS, MICROSCOPIC (REFLEX)

## 2021-09-11 LAB — RESP PANEL BY RT-PCR (FLU A&B, COVID) ARPGX2
Influenza A by PCR: NEGATIVE
Influenza B by PCR: NEGATIVE
SARS Coronavirus 2 by RT PCR: NEGATIVE

## 2021-09-11 LAB — ECHOCARDIOGRAM COMPLETE
Area-P 1/2: 3.37 cm2
Height: 63 in
S' Lateral: 2.9 cm
Weight: 3113.6 oz

## 2021-09-11 LAB — ETHANOL: Alcohol, Ethyl (B): <10 mg/dL (ref <10)

## 2021-09-11 LAB — HEMOGLOBIN A1C
Hgb A1c MFr Bld: 5.2 % (ref 4.8–5.6)
Mean Plasma Glucose: 102.54 mg/dL

## 2021-09-11 LAB — TSH: TSH: 2.034 u[IU]/mL (ref 0.350–4.500)

## 2021-09-11 LAB — LIPID PANEL
Cholesterol: 236 mg/dL — ABNORMAL HIGH (ref 0–200)
HDL: 50 mg/dL (ref >40)
LDL Cholesterol: 169 mg/dL — ABNORMAL HIGH (ref 0–99)
Total CHOL/HDL Ratio: 4.7 RATIO
Triglycerides: 85 mg/dL (ref <150)
VLDL: 17 mg/dL (ref 0–40)

## 2021-09-11 LAB — PROTIME-INR
INR: 1 (ref 0.8–1.2)
Prothrombin Time: 13.1 seconds (ref 11.4–15.2)

## 2021-09-11 LAB — HIV ANTIBODY (ROUTINE TESTING W REFLEX): HIV Screen 4th Generation wRfx: NONREACTIVE

## 2021-09-11 LAB — APTT: aPTT: 26 seconds (ref 24–36)

## 2021-09-11 LAB — CBG MONITORING, ED: Glucose-Capillary: 100 mg/dL — ABNORMAL HIGH (ref 70–99)

## 2021-09-11 MED ORDER — ASPIRIN EC 81 MG PO TBEC
81.0000 mg | DELAYED_RELEASE_TABLET | Freq: Every day | ORAL | Status: DC
  Administered 2021-09-12: 81 mg via ORAL
  Filled 2021-09-11: qty 1

## 2021-09-11 MED ORDER — LORATADINE 10 MG PO TABS
10.0000 mg | ORAL_TABLET | Freq: Every day | ORAL | Status: DC
  Administered 2021-09-12: 10 mg via ORAL
  Filled 2021-09-11: qty 1

## 2021-09-11 MED ORDER — CLOPIDOGREL BISULFATE 75 MG PO TABS
75.0000 mg | ORAL_TABLET | Freq: Every day | ORAL | Status: DC
  Administered 2021-09-11 – 2021-09-12 (×2): 75 mg via ORAL
  Filled 2021-09-11 (×2): qty 1

## 2021-09-11 MED ORDER — ACETAMINOPHEN 160 MG/5ML PO SOLN: 650.0000 mg | ORAL | Status: DC | PRN

## 2021-09-11 MED ORDER — MONTELUKAST SODIUM 10 MG PO TABS
10.0000 mg | ORAL_TABLET | Freq: Every day | ORAL | Status: DC
  Administered 2021-09-11: 10 mg via ORAL
  Filled 2021-09-11: qty 1

## 2021-09-11 MED ORDER — STROKE: EARLY STAGES OF RECOVERY BOOK
Freq: Once | Status: DC
  Filled 2021-09-11: qty 1

## 2021-09-11 MED ORDER — ALBUTEROL SULFATE (2.5 MG/3ML) 0.083% IN NEBU: 2.5000 mg | INHALATION_SOLUTION | RESPIRATORY_TRACT | Status: DC | PRN

## 2021-09-11 MED ORDER — ACETAMINOPHEN 325 MG PO TABS: 650.0000 mg | ORAL_TABLET | ORAL | Status: DC | PRN

## 2021-09-11 MED ORDER — FLUTICASONE FUROATE-VILANTEROL 100-25 MCG/ACT IN AEPB
1.0000 | INHALATION_SPRAY | Freq: Every day | RESPIRATORY_TRACT | Status: DC | PRN
  Filled 2021-09-11: qty 28

## 2021-09-11 MED ORDER — SENNOSIDES-DOCUSATE SODIUM 8.6-50 MG PO TABS: 1.0000 | ORAL_TABLET | Freq: Every evening | ORAL | Status: DC | PRN

## 2021-09-11 MED ORDER — ACETAMINOPHEN 650 MG RE SUPP: 650.0000 mg | RECTAL | Status: DC | PRN

## 2021-09-11 MED ORDER — ENOXAPARIN SODIUM 40 MG/0.4ML IJ SOSY
40.0000 mg | PREFILLED_SYRINGE | INTRAMUSCULAR | Status: DC
  Administered 2021-09-12: 40 mg via SUBCUTANEOUS
  Filled 2021-09-11: qty 0.4

## 2021-09-11 MED ORDER — ASPIRIN 325 MG PO TABS
325.0000 mg | ORAL_TABLET | Freq: Once | ORAL | Status: AC
  Administered 2021-09-11: 325 mg via ORAL
  Filled 2021-09-11: qty 1

## 2021-09-11 NOTE — Code Documentation (Signed)
Stroke Response Nurse Documentation Code Documentation  Denise Gamble is a 56 y.o. female arriving to [facility] ED via [facility] on 09/11/2021 with past medical hx of neuropathy. On No antithrombotic. Code stroke was activated by EMS.   Patient from home where she was LKW at 1030. Started having numbness/decreased sensation of her left face. She reports she called her PCP and they suggested she call 911. Patient reports at this time she began having decreased sensation of her left arm and leg. She reports that decreased sensation in her arm and leg are not abnormal due to her neuropathy and   Stroke team at the bedside on patient arrival. Labs drawn and patient cleared for CT by EDP. Patient to CT with team. NIHSS 1, see documentation for details and code stroke times. Patient with left decreased sensation on exam. The following imaging was completed: CT. Patient is not a candidate for IV Thrombolytic. Patient is not a candidate for IR due to no suspected LVO.  Care/Plan: Per MD, return to ED room and complete NIHSS Q4 for possible TIA work up.  Bedside handoff with ED RN Meghan.  Meda Klinefelter  Stroke Response RN

## 2021-09-11 NOTE — Assessment & Plan Note (Addendum)
On no medication. Last lipid panel in 5/22 showed LDL over 180, now 169, though HDL good at 50.  - Recommend continued shared-decision making. High intensity statin was recommended by neurology and prescribed. 10 year ASCVD score is 2.9%.

## 2021-09-11 NOTE — ED Provider Notes (Signed)
[facility] EMERGENCY DEPARTMENT Provider Note   CSN: [id number] Arrival date & time: 09/11/21  1200  An emergency department physician performed an initial assessment on this suspected stroke patient at 1200.  History  Chief Complaint  Patient presents with   Code Stroke    Denise Gamble is a 56 y.o. female who reports a history of asthma and thoracic outlet syndrome otherwise healthy presented by EMS today as a code stroke.  Patient reports she was walking her dog around 930-10 AM this morning when she had decreased peripheral vision, left facial numbness and left arm weakness.  Patient reports left-sided weakness as a heavy sensation which is mild and constant.  She denies similar symptoms in the past, reports on my exam symptoms are slightly improved.  Patient was met at the bridge by neurology, she was taken immediately to the CT scanner, CT was negative for acute findings.  Currently awaiting neurology recommendations, patient has received full dose aspirin.  Patient denies headache, fall/injury, fever/chills, neck stiffness, chest pain/shortness of breath, abdominal pain, vomiting, diarrhea, recent illness or any additional concerns.  HPI     Home Medications Prior to Admission medications   Medication Sig Start Date End Date Taking? Authorizing Provider  ADVAIR HFA [phone] MCG/ACT inhaler USE 2 INHALATIONS TWICE A DAY Patient taking differently: 2 puffs 2 (two) times daily as needed (shortness of breath). 06/03/21  Yes Abonza, Maritza, PA-C  albuterol (VENTOLIN HFA) 108 (90 Base) MCG/ACT inhaler USE 2 INHALATIONS EVERY 4 HOURS AS NEEDED FOR WHEEZING OR SHORTNESS OF BREATH Patient taking differently: 2 puffs every 4 (four) hours as needed for wheezing or shortness of breath. 09/16/20  Yes Abonza, Maritza, PA-C  cetirizine (ZYRTEC) 10 MG tablet Take 10 mg by mouth daily.   Yes [provider]  cholecalciferol (VITAMIN D3) 25 MCG (1000 UNIT) tablet Take  1,000 Units by mouth daily.   Yes [provider]  ibuprofen (ADVIL) 200 MG tablet Take 400 mg by mouth every 6 (six) hours as needed for mild pain.   Yes [provider]  magnesium gluconate (MAGONATE) 500 MG tablet Take 500 mg by mouth 2 (two) times a week.   Yes [provider]  montelukast (SINGULAIR) 10 MG tablet TAKE 1 TABLET AT BEDTIME Patient taking differently: Take 10 mg by mouth at bedtime. 08/05/21  Yes Abonza, Maritza, PA-C  Omega-3 Fatty Acids (EQL OMEGA 3 FISH OIL) 1000 MG CPDR Take 1,000 mg by mouth daily.   Yes [provider]  vitamin B-12 (CYANOCOBALAMIN) 1000 MCG tablet Take 1,000 mcg by mouth daily.   Yes [provider]      Allergies    Pollen extract and Latex    Review of Systems   Review of Systems Ten systems are reviewed and are negative for acute change except as noted in the HPI  Physical Exam Updated Vital Signs BP 135/84    Pulse 69    Resp 17    Ht 5' 3"  (1.6 m)    Wt 88.3 kg    LMP 03/29/2019    SpO2 98%    BMI 34.47 kg/m  Physical Exam Constitutional:      General: She is not in acute distress.    Appearance: Normal appearance. She is well-developed. She is not ill-appearing or diaphoretic.  HENT:     Head: Normocephalic and atraumatic.  Eyes:     General: Vision grossly intact. Gaze aligned appropriately.     Pupils: Pupils are  equal, round, and reactive to light.  Neck:     Trachea: Trachea and phonation normal.  Pulmonary:     Effort: Pulmonary effort is normal. No respiratory distress.  Abdominal:     General: There is no distension.     Palpations: Abdomen is soft.     Tenderness: There is no abdominal tenderness. There is no guarding or rebound.  Musculoskeletal:        General: Normal range of motion.     Cervical back: Normal range of motion.  Skin:    General: Skin is warm and dry.  Neurological:     Mental Status: She is alert.     GCS: GCS eye subscore is 4. GCS verbal subscore is 5.  GCS motor subscore is 6.     Comments: Speech is clear and goal oriented, follows commands Major Cranial nerves without deficit, no facial droop Questionable slight decrease strength left upper extremity compared to right. Sensation normal to light and sharp touch Moves extremities without ataxia, coordination intact Normal finger to nose and rapid alternating movements No pronator drift   Psychiatric:        Behavior: Behavior normal.    ED Results / Procedures / Treatments   Labs (all labs ordered are listed, but only abnormal results are displayed) Labs Reviewed  COMPREHENSIVE METABOLIC PANEL - Abnormal; Notable for the following components:      Result Value   Calcium 8.8 (*)    All other components within normal limits  CBG MONITORING, ED - Abnormal; Notable for the following components:   Glucose-Capillary 100 (*)    All other components within normal limits  I-STAT CHEM 8, ED - Abnormal; Notable for the following components:   Calcium, Ion 1.05 (*)    All other components within normal limits  I-STAT BETA HCG BLOOD, ED ([facility], [facility], [facility] ONLY) - Abnormal; Notable for the following components:   I-stat hCG, quantitative 10.1 (*)    All other components within normal limits  RESP PANEL BY RT-PCR (FLU A&B, COVID) ARPGX2  PROTIME-INR  APTT  CBC  DIFFERENTIAL  RAPID URINE DRUG SCREEN, HOSP PERFORMED  ETHANOL  URINALYSIS, ROUTINE W REFLEX MICROSCOPIC  LIPID PANEL  HEMOGLOBIN A1C  TSH    EKG EKG Interpretation  Date/Time:  Thursday September 11 2021 12:32:05 EST Ventricular Rate:  79 PR Interval:  171 QRS Duration: 87 QT Interval:  387 QTC Calculation: 444 R Axis:   56 Text Interpretation: Sinus rhythm No significant change since last tracing 2/20 Confirmed by Aletta Edouard [phone]) on 09/11/2021 12:36:27 PM  Radiology CT HEAD CODE STROKE WO CONTRAST  Result Date: 09/11/2021 CLINICAL DATA:  Code stroke.  Neuro deficit EXAM: CT HEAD WITHOUT CONTRAST TECHNIQUE:  Contiguous axial images were obtained from the base of the skull through the vertex without intravenous contrast. RADIATION DOSE REDUCTION: This exam was performed according to the departmental dose-optimization program which includes automated exposure control, adjustment of the mA and/or kV according to patient size and/or use of iterative reconstruction technique. COMPARISON:  None. FINDINGS: Brain: There is no evidence of acute intracranial hemorrhage, extra-axial fluid collection, or acute infarct. Parenchymal volume is normal. The ventricles are normal in size. There is no mass lesion. There is no midline shift. Vascular: No hyperdense vessel or unexpected calcification. Skull: Normal. Negative for fracture or focal lesion. Sinuses/Orbits: The imaged paranasal sinuses are clear. The imaged globes and orbits are unremarkable. Other: None. ASPECTS [facility] Stroke Program Early CT Score) - Ganglionic level infarction (  caudate, lentiform nuclei, internal capsule, insula, M1-M3 cortex): 7 - Supraganglionic infarction (M4-M6 cortex): 3 Total score (0-10 with 10 being normal): 10 IMPRESSION: 1. No acute intracranial hemorrhage or infarct. 2. ASPECTS is 10 These results were paged via AMION at the time of interpretation on 09/11/2021 at 12:26 pm to provider Dr Theda Sers. Electronically Signed   By: Valetta Mole M.D.   On: 09/11/2021 12:27    Procedures Procedures    Medications Ordered in ED Medications  aspirin tablet 325 mg (325 mg Oral Given 09/11/21 1243)    ED Course/ Medical Decision Making/ A&P Clinical Course as of 09/11/21 1346  Thu Sep 11, 9630  [id number] 56 year old female here with left-sided stroke symptoms.  Stroke activation on arrival and was evaluated by neurology and had an emergent head CT.  Neuro is recommending admission for MRI and further work-up. [MB]    Clinical Course User Index [MB] Hayden Rasmussen, MD                           Medical Decision Making 56 year old female arrived  as code stroke by EMS today. Patient's symptoms began around 930-10 AM, she reports left-sided weakness and left facial tingling, symptoms somewhat improved  Met at the bridge by neurology, she was taken immediately to CT scanner.  CT head was negative for acute findings.  On my initial evaluation patient is well-appearing and in no acute distress.  Code stroke lab work is currently pending.  Patient has no other complaints today.  Will await neurology recommendations.  Amount and/or Complexity of Data Reviewed Independent Historian: spouse    Details: Patient's husband at bedside Labs: ordered.    Details: CMP without emergent electrolyte derangement, AKI, LFT elevations or gap. CBG 100 I-STAT hCG 10.1, suspect false positive CBC within normal limits, no leukocytosis to suggest infectious process, no anemia or thrombocytopenia. Ethanol negative, patient does not appear to be intoxicated or in withdrawal. PT/INR APTT unremarkable UDS negative  Risk Decision regarding hospitalization.  Neurology team recommends MRI brain without contrast.  Neurology asked for patient to be admitted to medicine team for CVA work-up.  I ordered the MRI brain, patient denies presence of pacemaker or any metallic foreign bodies.  I reassessed the patient, patient stated understanding of need for admission she was agreeable, she has no additional complaints or concerns.  On reassessment vital signs are stable, patient is in no acute distress.  I consulted with Dr. Rogers Blocker, patient was accepted for admission.  Patient was seen and evaluated by attending physician Dr. Melina Copa during this visit who agrees with management and admission  Note: Portions of this report may have been transcribed using voice recognition software. Every effort was made to ensure accuracy; however, inadvertent computerized transcription errors may still be present.         Final Clinical Impression(s) / ED Diagnoses Final diagnoses:   Left-sided weakness    Rx / DC Orders ED Discharge Orders     None         Gari Crown 09/11/21 1346    Hayden Rasmussen, MD 09/11/21 2027

## 2021-09-11 NOTE — Consult Note (Addendum)
Neurology Stroke Consult H&P  Denise Gamble MR# [id number] 09/11/2021   CC: left sided weakness  History is obtained from: patient and chart.  HPI: Denise Gamble is a 56 y.o. female PMHx as reviewed below, bilateral thoracic outlet syndrome and left lower extremity neuropathy was walking her dog this morning and her left face started to feel numb which was followed by her left arm and leg starting to feel heavy. This was different than her symptoms related to her thoracic outlet syndrome.  She has never felt this in the past.  EMS arrived and BP was 180/95  She   LKW: 1030 tNK given: No too mild IR Thrombectomy Not indicated Modified Rankin Scale: 0-Completely asymptomatic and back to baseline post- stroke NIHSS: 1  ROS: A complete ROS was performed and is negative except as noted in the HPI.  Past Medical History:  Diagnosis Date   Allergy    seasonal allergies   Asthma    adult onset (1998)   GERD (gastroesophageal reflux disease)    for PRN use   History of MRSA infection    Miscarriage 2013   multiple    Paresthesia of lower extremity    Peripheral neuropathy 2019   left foot   Plantar fasciitis of left foot    Thoracic outlet syndrome    Urinary incontinence      Family History  Problem Relation Age of Onset   Heart disease Mother 17       epstein anolmay    Dementia Mother    Colon polyps Mother    Diverticulitis Mother    Colon cancer Father 42   Skin cancer Father        ? thinks melanoma?    Leukemia Father        AAL   Hyperlipidemia Father    Hypertension Father    Colon polyps Father 40   Heart disease Brother    Alcohol abuse Brother    Diabetes Brother    Hypertension Brother    Prostate cancer Brother    Coronary artery disease Brother    Colon polyps Brother    Hyperlipidemia Sister    Hypertension Sister    Stroke Sister 64   Brain cancer Sister 27       mets from lung   Lung cancer Sister 66       mets to brain-then  stroke   Heart attack Maternal Grandfather    Hypertension Sister    Hypertension Sister    Colon polyps Brother 3   Alzheimer's disease Maternal Aunt    Breast cancer Neg Hx    Esophageal cancer Neg Hx    Stomach cancer Neg Hx    Rectal cancer Neg Hx     Social History:  reports that she has never smoked. She has never used smokeless tobacco. She reports that she does not drink alcohol and does not use drugs.   Prior to Admission medications   Medication Sig Start Date End Date Taking? Authorizing Provider  ADVAIR HFA [phone] MCG/ACT inhaler USE 2 INHALATIONS TWICE A DAY 06/03/21   Abonza, Maritza, PA-C  albuterol (VENTOLIN HFA) 108 (90 Base) MCG/ACT inhaler USE 2 INHALATIONS EVERY 4 HOURS AS NEEDED FOR WHEEZING OR SHORTNESS OF BREATH 09/16/20   Abonza, Maritza, PA-C  cetirizine (ZYRTEC) 10 MG tablet Take 10 mg by mouth.    [provider]  cholecalciferol (VITAMIN D3) 25 MCG (1000 UNIT) tablet Take 1,000 Units by mouth daily.  [provider]  montelukast (SINGULAIR) 10 MG tablet TAKE 1 TABLET AT BEDTIME 08/05/21   Abonza, Maritza, PA-C  vitamin B-12 (CYANOCOBALAMIN) 100 MCG tablet Take 100 mcg by mouth daily.    [provider]    Exam: Current vital signs: BP (!) 135/95    Pulse 72    Resp 18    Ht 5' 3"  (1.6 m)    Wt 88.3 kg    LMP 03/29/2019    SpO2 99%    BMI 34.47 kg/m   Physical Exam  Constitutional: Appears well-developed and well-nourished.  Psych: Affect appropriate to situation Eyes: No scleral injection HENT: No OP obstruction. Head: Normocephalic.  Cardiovascular: Normal rate and regular rhythm.  Respiratory: Effort normal, symmetric excursions bilaterally, no audible wheezing. GI: Soft.  No distension. There is no tenderness.  Skin: WDI  Neuro: Mental Status: Patient is awake, alert, oriented to person, place, month, year, and situation. Patient is able to give a clear and coherent history. Speech fluent, intact comprehension and  repetition. No signs of aphasia or neglect. Visual Fields are full. Pupils are equal, round, and reactive to light. EOMI without ptosis or diploplia.  Facial left sensation is mildly decreased to temperature Facial movement is symmetric.  Hearing is intact to voice. Uvula midline and palate elevates symmetrically. Shoulder shrug is symmetric. Tongue is midline without atrophy or fasciculations.  Tone is normal. Bulk is normal. 5/5 strength was present in all four extremities. Sensation is symmetric to light touch and temperature in the arms and legs. Deep Tendon Reflexes: 2+ and symmetric in the biceps and patellae. Toes are downgoing bilaterally. FNF and HKS are intact bilaterally. Gait - Deferred  I have reviewed labs in epic and the pertinent results are: None available  I have reviewed the images obtained: NCT head showed no acute ischemic changes  Assessment: Denise Gamble is a 56 y.o. female PMHx as noted above with left face numbness and left extremity heaviness. She does not have HTN or significant vascular risk factors and her symptoms in setting of transiently elevated blood pressure are suggestive of TIA.  Recommended aspirin 371m now.  Impression:  TIA Left face numbness - resolved Left sided weakness - resolved NIHSS 1 HLD  Plan: - MRI brain without contrast. - Recommend vascular imaging with MRA head and neck. - Recommend TTE. - Recommend labs: HbA1c, lipid panel, TSH. - Recommend Statin if LDL > 70 - Aspirin 853mdaily. - Clopidogrel 7547maily for 3 weeks. - SBP goal <180. - Telemetry monitoring for arrhythmia. - Recommend bedside Swallow screen. - Recommend Stroke education. - Recommend PT/OT/SLP consult.   This patient is critically ill and at significant risk of neurological worsening, death and care requires constant monitoring of vital signs, hemodynamics,respiratory and cardiac monitoring, neurological assessment, discussion with family,  other specialists and medical decision making of high complexity. I spent 73 minutes of neurocritical care time  in the care of  this patient. This was time spent independent of any time provided by nurse practitioner or PA.  Electronically signed by:  HunLynnae SandhoffD Page: [id number]/2023, 12:59 PM  If 7pm- 7am, please page neurology on call as listed in [facility]

## 2021-09-11 NOTE — H&P (Signed)
History and Physical    Patient: Denise Gamble LZJ:[id number] DOB: [birth year] DOA: 09/11/2021 DOS: the patient was seen and examined on 09/11/2021 PCP: [name], PA-C  Patient coming from: Home - lives with her husband.    Chief Complaint: left sided weakness  HPI: Denise Gamble is a 56 y.o. female with medical history significant of bilateral thoracic outlet syndrome and left lower extremity neuropathy, asthma, HLD who presented to Ed with complaints of left sided weakness.  She was walking her dog around 10:00am and had sudden onset dizziness and soon after she got home she had left sided facial numbness.  She called her husband and PCP.  She then had her left arm get heavy and tingling. Her legs felt heavy and week. She then called 911 around 11am. She has a history of thoracic outlet syndrome, but these symptoms in her left arm feel different from this as she only has tingling when she raises above her head. She continued to be dizzy.  Her symptoms have improved, but her cheek still feels "funny" and her arm is still not back to normal. She had no slurred speech or facial drooping. Gait was normal. She is still a little dizzy, but much better.  She denies any headaches or vision changes. No ear pain or ringing.   No fever/chills. No headaches or vision changes, no chest pain or palpitations, no shortness of breath or cough, no stomach pain or N/V/D, no dysuria or leg swelling.   +CVA in her sister, no other family history. She does not have diabetes, HTN, any smoking or drinking history.   ER Course:   Vitals: bp: 123/62, HR: 87, RR: 18, oxygen:99%RA Pertinent labs: none CTH: no acute finding. In ed code stroke initiated. Neurology consulted. TRH asked to admit. Given ASA 384m.     Review of Systems: As mentioned in the history of present illness. All other systems reviewed and are negative. Past Medical History:  Diagnosis Date   Allergy    seasonal allergies   Asthma     adult onset (1998)   GERD (gastroesophageal reflux disease)    for PRN use   History of MRSA infection    Miscarriage 2013   multiple    Paresthesia of lower extremity    Peripheral neuropathy 2019   left foot   Plantar fasciitis of left foot    Thoracic outlet syndrome    Urinary incontinence    Past Surgical History:  Procedure Laterality Date   DILATION AND CURETTAGE OF UTERUS  2011   LYMPHADENECTOMY Left 1979   WISDOM TOOTH EXTRACTION     Social History:  reports that she has never smoked. She has never used smokeless tobacco. She reports that she does not drink alcohol and does not use drugs.  Allergies  Allergen Reactions   Pollen Extract Hives    All tree pollens and dust mites   Latex Hives    Family History  Problem Relation Age of Onset   Heart disease Mother 863      epstein anolmay    Dementia Mother    Colon polyps Mother    Diverticulitis Mother    Colon cancer Father 842  Skin cancer Father        ? thinks melanoma?    Leukemia Father        AAL   Hyperlipidemia Father    Hypertension Father    Colon polyps Father 852  Heart disease Brother  Alcohol abuse Brother    Diabetes Brother    Hypertension Brother    Prostate cancer Brother    Coronary artery disease Brother    Colon polyps Brother    Hyperlipidemia Sister    Hypertension Sister    Stroke Sister 78   Brain cancer Sister 76       mets from lung   Lung cancer Sister 48       mets to brain-then stroke   Heart attack Maternal Grandfather    Hypertension Sister    Hypertension Sister    Colon polyps Brother 57   Alzheimer's disease Maternal Aunt    Breast cancer Neg Hx    Esophageal cancer Neg Hx    Stomach cancer Neg Hx    Rectal cancer Neg Hx     Prior to Admission medications   Medication Sig Start Date End Date Taking? Authorizing Provider  ADVAIR HFA [phone] MCG/ACT inhaler USE 2 INHALATIONS TWICE A DAY Patient taking differently: 2 puffs 2 (two) times daily as needed  (shortness of breath). 06/03/21  Yes Abonza, Maritza, PA-C  albuterol (VENTOLIN HFA) 108 (90 Base) MCG/ACT inhaler USE 2 INHALATIONS EVERY 4 HOURS AS NEEDED FOR WHEEZING OR SHORTNESS OF BREATH Patient taking differently: 2 puffs every 4 (four) hours as needed for wheezing or shortness of breath. 09/16/20  Yes Abonza, Maritza, PA-C  cetirizine (ZYRTEC) 10 MG tablet Take 10 mg by mouth daily.   Yes [provider]  cholecalciferol (VITAMIN D3) 25 MCG (1000 UNIT) tablet Take 1,000 Units by mouth daily.   Yes [provider]  ibuprofen (ADVIL) 200 MG tablet Take 400 mg by mouth every 6 (six) hours as needed for mild pain.   Yes [provider]  magnesium gluconate (MAGONATE) 500 MG tablet Take 500 mg by mouth 2 (two) times a week.   Yes [provider]  montelukast (SINGULAIR) 10 MG tablet TAKE 1 TABLET AT BEDTIME Patient taking differently: Take 10 mg by mouth at bedtime. 08/05/21  Yes Abonza, Maritza, PA-C  Omega-3 Fatty Acids (EQL OMEGA 3 FISH OIL) 1000 MG CPDR Take 1,000 mg by mouth daily.   Yes [provider]  vitamin B-12 (CYANOCOBALAMIN) 1000 MCG tablet Take 1,000 mcg by mouth daily.   Yes [provider]    Physical Exam: Vitals:   09/11/21 1300 09/11/21 1330 09/11/21 1412 09/11/21 1549  BP: 135/84 104/84  110/76  Pulse: 69 68  70  Resp: 17 19  16   Temp:   98 F (36.7 C)   SpO2: 98% 99%  100%  Weight:      Height:       General:  Appears calm and comfortable and is in NAD Eyes:  PERRL, EOMI, normal lids, iris ENT:  grossly normal hearing, lips & tongue, mmm; appropriate dentition. Tm pearly with light reflex bilaterally Neck:  no LAD, masses or thyromegaly; no carotid bruits Cardiovascular:  RRR, no m/r/g. No LE edema.  Respiratory:   CTA bilaterally with no wheezes/rales/rhonchi.  Normal respiratory effort. Abdomen:  soft, NT, ND, NABS Back:   normal alignment, no CVAT Skin:  no rash or induration seen on limited  exam Musculoskeletal:  grossly normal tone BUE/BLE, good ROM, no bony abnormality Lower extremity:  No LE edema.  Limited foot exam with no ulcerations.  2+ distal pulses. Psychiatric:  grossly normal mood and affect, speech fluent and appropriate, AOx3 Neurologic:  CN 2-12 grossly intact, moves all extremities in coordinated fashion, sensation intact. Intact bilateral  HTK and FTN. DTR 2+, negative pronator drift, gait slow and steady.    Radiological Exams on Admission: Independently reviewed - see discussion in A/P where applicable  MR ANGIO HEAD WO CONTRAST  Result Date: 09/11/2021 CLINICAL DATA:  Left-sided weakness EXAM: MRI HEAD WITHOUT CONTRAST MRA HEAD WITHOUT CONTRAST TECHNIQUE: Multiplanar, multi-echo pulse sequences of the brain and surrounding structures were acquired without intravenous contrast. Angiographic images of the Circle of Willis were acquired using MRA technique without intravenous contrast. COMPARISON:  Same-day noncontrast CT head FINDINGS: MRI HEAD FINDINGS Brain: There is no evidence of acute intracranial hemorrhage, extra-axial fluid collection, or acute infarct. Parenchymal volume is normal. The ventricles are normal in size. There is no significant burden of white matter microangiopathic change. There is no mass lesion.  There is no midline shift. Vascular: Normal flow voids. Skull and upper cervical spine: Normal marrow signal. Sinuses/Orbits: The paranasal sinuses are clear. The globes and orbits are unremarkable. Other: None. MRA HEAD FINDINGS Anterior circulation: The intracranial ICAs are patent Bilateral MCAs are patent. The bilateral ACAs are patent. There is no aneurysm or AVM. Posterior circulation: The right V4 segment is diminutive particularly after the PICA origin, likely developmental variant. Left V4 segment is widely patent. PICA is identified bilaterally. The basilar artery is patent but diminutive, likely related to the prominent posterior communicating  arteries which supply the PCAs, predominantly fetal type bilaterally with a diminutive right P1 segment. The bilateral PCAs are patent. There is no aneurysm or AVM. Anatomic variants: As above. IMPRESSION: 1. No acute intracranial pathology. 2. Patent intracranial vasculature. Electronically Signed   By: Valetta Mole M.D.   On: 09/11/2021 15:35   MR BRAIN WO CONTRAST  Result Date: 09/11/2021 CLINICAL DATA:  Left-sided weakness EXAM: MRI HEAD WITHOUT CONTRAST MRA HEAD WITHOUT CONTRAST TECHNIQUE: Multiplanar, multi-echo pulse sequences of the brain and surrounding structures were acquired without intravenous contrast. Angiographic images of the Circle of Willis were acquired using MRA technique without intravenous contrast. COMPARISON:  Same-day noncontrast CT head FINDINGS: MRI HEAD FINDINGS Brain: There is no evidence of acute intracranial hemorrhage, extra-axial fluid collection, or acute infarct. Parenchymal volume is normal. The ventricles are normal in size. There is no significant burden of white matter microangiopathic change. There is no mass lesion.  There is no midline shift. Vascular: Normal flow voids. Skull and upper cervical spine: Normal marrow signal. Sinuses/Orbits: The paranasal sinuses are clear. The globes and orbits are unremarkable. Other: None. MRA HEAD FINDINGS Anterior circulation: The intracranial ICAs are patent Bilateral MCAs are patent. The bilateral ACAs are patent. There is no aneurysm or AVM. Posterior circulation: The right V4 segment is diminutive particularly after the PICA origin, likely developmental variant. Left V4 segment is widely patent. PICA is identified bilaterally. The basilar artery is patent but diminutive, likely related to the prominent posterior communicating arteries which supply the PCAs, predominantly fetal type bilaterally with a diminutive right P1 segment. The bilateral PCAs are patent. There is no aneurysm or AVM. Anatomic variants: As above. IMPRESSION: 1.  No acute intracranial pathology. 2. Patent intracranial vasculature. Electronically Signed   By: Valetta Mole M.D.   On: 09/11/2021 15:35   CT HEAD CODE STROKE WO CONTRAST  Result Date: 09/11/2021 CLINICAL DATA:  Code stroke.  Neuro deficit EXAM: CT HEAD WITHOUT CONTRAST TECHNIQUE: Contiguous axial images were obtained from the base of the skull through the vertex without intravenous contrast. RADIATION DOSE REDUCTION: This exam was performed according to the departmental dose-optimization program which includes  automated exposure control, adjustment of the mA and/or kV according to patient size and/or use of iterative reconstruction technique. COMPARISON:  None. FINDINGS: Brain: There is no evidence of acute intracranial hemorrhage, extra-axial fluid collection, or acute infarct. Parenchymal volume is normal. The ventricles are normal in size. There is no mass lesion. There is no midline shift. Vascular: No hyperdense vessel or unexpected calcification. Skull: Normal. Negative for fracture or focal lesion. Sinuses/Orbits: The imaged paranasal sinuses are clear. The imaged globes and orbits are unremarkable. Other: None. ASPECTS [facility] Stroke Program Early CT Score) - Ganglionic level infarction (caudate, lentiform nuclei, internal capsule, insula, M1-M3 cortex): 7 - Supraganglionic infarction (M4-M6 cortex): 3 Total score (0-10 with 10 being normal): 10 IMPRESSION: 1. No acute intracranial hemorrhage or infarct. 2. ASPECTS is 10 These results were paged via AMION at the time of interpretation on 09/11/2021 at 12:26 pm to provider Dr Theda Sers. Electronically Signed   By: Valetta Mole M.D.   On: 09/11/2021 12:27    EKG: Independently reviewed.  NSR with rate 79; nonspecific ST changes with no evidence of acute ischemia   Labs on Admission: I have personally reviewed the available labs and imaging studies at the time of the admission.  Pertinent labs:   None     Assessment and Plan: Assessment and  Plan: * Acute left-sided weakness 56 year old female with sudden onset dizziness and left sided weakness and facial tingling with risk factors for stroke including hyperlipidemia and family history.  -place in observation on telemetry for TIA/stroke work-up -Neurochecks per protocol -Neurology consulted -MRI/MRA brain without contrast ordered  -echo -lipid/a1c pending  -ASA 24m daily and plavix 718mx 3 weeks  -Permissive hypertension first 24 hours <180 -N.p.o. until bedside swallow screen -PT/OT/ST eval    Hyperlipidemia- (present on admission) On no medication. Last lipid panel in 5/22 showed LDL over 180 Repeat pending here, here 10 year ASCVD score is 2.9%, but with such elevated LDL would consider statin therapy vs. Coronary calcium score if MRI brain is wnl.    Asthma- (present on admission) Stable, continue albuterol prn and advair prn  singulair at night   Thoracic outlet syndrome- (present on admission) Stable, diagnosed in her 2032sOpted not for intervention.  Symptoms with arms above the head movement If worsening symptoms>outpatient f/u       Advance Care Planning:   Code Status: Full Code full   Consults: neurology, Dr. CoTheda Sers DVT Prophylaxis: lovenox   Family Communication: husband at bedside: PaTarissa Gamble  Severity of Illness: The appropriate patient status for this patient is OBSERVATION. Observation status is judged to be reasonable and necessary in order to provide the required intensity of service to ensure the patient's safety. The patient's presenting symptoms, physical exam findings, and initial radiographic and laboratory data in the context of their medical condition is felt to place them at decreased risk for further clinical deterioration. Furthermore, it is anticipated that the patient will be medically stable for discharge from the hospital within 2 midnights of admission.   Author: AlOrma FlamingMD 09/11/2021 4:22 PM  For on call review  [email or web address]

## 2021-09-11 NOTE — ED Triage Notes (Signed)
Pt BIBA from home. Pt was walking dog and started having left sided weakness at 1030. Pt was able to ambulate, but felt "heavy" on that side.

## 2021-09-11 NOTE — Assessment & Plan Note (Signed)
Stable, diagnosed in her 72s. Opted not for intervention.  Symptoms with arms above the head movement If worsening symptoms>outpatient f/u

## 2021-09-11 NOTE — Assessment & Plan Note (Addendum)
56 year old female with sudden onset dizziness and left sided weakness and facial abnormal sensation. Symptoms have improved, no acute findings on MRI, MRA, carotid U/S, echocardiogram.  - DAPT x3 weeks, then ASA, start statin, f/u w/neurology in 6-8 weeks for TIA

## 2021-09-11 NOTE — ED Notes (Signed)
Pt transported to MRI 

## 2021-09-11 NOTE — Assessment & Plan Note (Addendum)
Stable, continue albuterol prn and advair prn  singulair at night

## 2021-09-11 NOTE — Progress Notes (Signed)
°  Echocardiogram 2D Echocardiogram has been performed.  Denise Gamble 09/11/2021, 3:47 PM

## 2021-09-11 NOTE — Progress Notes (Signed)
PT Cancellation Note  Patient Details Name: Denise Gamble MRN: [id number] DOB: 09-30-1965   Cancelled Treatment:    Reason Eval/Treat Not Completed: Patient at procedure or test/unavailable PT currently at MRI. Will follow up as schedule allows.   Reuel Derby, PT, DPT  Acute Rehabilitation Services  Pager: [phone] Office: [phone]    Rudean Hitt 09/11/2021, 3:12 PM

## 2021-09-12 ENCOUNTER — Observation Stay (HOSPITAL_BASED_OUTPATIENT_CLINIC_OR_DEPARTMENT_OTHER): Payer: Managed Care, Other (non HMO)

## 2021-09-12 DIAGNOSIS — G6289 Other specified polyneuropathies: Secondary | ICD-10-CM | POA: Diagnosis not present

## 2021-09-12 DIAGNOSIS — R531 Weakness: Secondary | ICD-10-CM | POA: Diagnosis not present

## 2021-09-12 DIAGNOSIS — G540 Brachial plexus disorders: Secondary | ICD-10-CM

## 2021-09-12 DIAGNOSIS — E785 Hyperlipidemia, unspecified: Secondary | ICD-10-CM | POA: Diagnosis not present

## 2021-09-12 DIAGNOSIS — J4520 Mild intermittent asthma, uncomplicated: Secondary | ICD-10-CM | POA: Diagnosis not present

## 2021-09-12 MED ORDER — CLOPIDOGREL BISULFATE 75 MG PO TABS: 75.0000 mg | ORAL_TABLET | Freq: Every day | ORAL | 0 refills | Status: AC

## 2021-09-12 MED ORDER — ATORVASTATIN CALCIUM 80 MG PO TABS
80.0000 mg | ORAL_TABLET | Freq: Every day | ORAL | Status: DC
  Administered 2021-09-12: 80 mg via ORAL
  Filled 2021-09-12: qty 1

## 2021-09-12 MED ORDER — ASPIRIN 81 MG PO TBEC: 81.0000 mg | DELAYED_RELEASE_TABLET | Freq: Every day | ORAL | 1 refills | Status: AC

## 2021-09-12 MED ORDER — ATORVASTATIN CALCIUM 80 MG PO TABS: 80.0000 mg | ORAL_TABLET | Freq: Every day | ORAL | 1 refills | Status: DC

## 2021-09-12 MED ORDER — ATORVASTATIN CALCIUM 40 MG PO TABS
40.0000 mg | ORAL_TABLET | Freq: Every day | ORAL | Status: DC
  Filled 2021-09-12: qty 1

## 2021-09-12 NOTE — Progress Notes (Signed)
IV site removed and tolerated well; site clean/dry/intact, gauze dressing in place.

## 2021-09-12 NOTE — Progress Notes (Addendum)
STROKE TEAM PROGRESS NOTE   INTERVAL HISTORY Patient reports she became dizzy (loss of equilibrium, not akin to vertigo) while walking dogs and noted numbness of R lower face, RUE, as well as tingling of fingers and then LLE weakness, which has all resolved today. Remote Hx of migraines. No HA recently. No hx of prior stroke/TIA. ECHO and MRI WNL. Carotid US pending. Denies use of anticoagulation. Unsure if she snores/has concerns for OSA.   Vitals:   09/11/21 1631 09/11/21 2035 09/11/21 2358 09/12/21 0338  BP: (!) 117/98 (!) 124/94 112/64 97/69  Pulse: 76 75 68 70  Resp: 18  17 17   Temp: 97.9 F (36.6 C) 98.2 F (36.8 C) 98.1 F (36.7 C) 97.7 F (36.5 C)  TempSrc: Oral Oral Oral Oral  SpO2: 99% 98% 99% 96%  Weight:      Height:       CBC:  Recent Labs  Lab 09/11/21 1200 09/11/21 1215  WBC 7.0  --   NEUTROABS 4.2  --   HGB 13.3 14.3  HCT 42.0 42.0  MCV 98.4  --   PLT 253  --    Basic Metabolic Panel:  Recent Labs  Lab 09/11/21 1200 09/11/21 1215  NA 139 141  K 3.8 3.6  CL 106 105  CO2 23  --   GLUCOSE 90 90  BUN 11 12  CREATININE 0.90 0.80  CALCIUM 8.8*  --    Lipid Panel:  Recent Labs  Lab 09/11/21 1619  CHOL 236*  TRIG 85  HDL 50  CHOLHDL 4.7  VLDL 17  LDLCALC 169*   HgbA1c:  Recent Labs  Lab 09/11/21 1619  HGBA1C 5.2   Urine Drug Screen:  Recent Labs  Lab 09/11/21 1214  LABOPIA NONE DETECTED  COCAINSCRNUR NONE DETECTED  LABBENZ NONE DETECTED  AMPHETMU NONE DETECTED  THCU NONE DETECTED  LABBARB NONE DETECTED    Alcohol Level  Recent Labs  Lab 09/11/21 1619  ETH <10    IMAGING past 24 hours MR ANGIO HEAD WO CONTRAST  Result Date: 09/11/2021 CLINICAL DATA:  Left-sided weakness EXAM: MRI HEAD WITHOUT CONTRAST MRA HEAD WITHOUT CONTRAST TECHNIQUE: Multiplanar, multi-echo pulse sequences of the brain and surrounding structures were acquired without intravenous contrast. Angiographic images of the Circle of Willis were acquired using MRA  technique without intravenous contrast. COMPARISON:  Same-day noncontrast CT head FINDINGS: MRI HEAD FINDINGS Brain: There is no evidence of acute intracranial hemorrhage, extra-axial fluid collection, or acute infarct. Parenchymal volume is normal. The ventricles are normal in size. There is no significant burden of white matter microangiopathic change. There is no mass lesion.  There is no midline shift. Vascular: Normal flow voids. Skull and upper cervical spine: Normal marrow signal. Sinuses/Orbits: The paranasal sinuses are clear. The globes and orbits are unremarkable. Other: None. MRA HEAD FINDINGS Anterior circulation: The intracranial ICAs are patent Bilateral MCAs are patent. The bilateral ACAs are patent. There is no aneurysm or AVM. Posterior circulation: The right V4 segment is diminutive particularly after the PICA origin, likely developmental variant. Left V4 segment is widely patent. PICA is identified bilaterally. The basilar artery is patent but diminutive, likely related to the prominent posterior communicating arteries which supply the PCAs, predominantly fetal type bilaterally with a diminutive right P1 segment. The bilateral PCAs are patent. There is no aneurysm or AVM. Anatomic variants: As above. IMPRESSION: 1. No acute intracranial pathology. 2. Patent intracranial vasculature. Electronically Signed   By: Valetta Mole M.D.   On: 09/11/2021  15:35   MR BRAIN WO CONTRAST  Result Date: 09/11/2021 CLINICAL DATA:  Left-sided weakness EXAM: MRI HEAD WITHOUT CONTRAST MRA HEAD WITHOUT CONTRAST TECHNIQUE: Multiplanar, multi-echo pulse sequences of the brain and surrounding structures were acquired without intravenous contrast. Angiographic images of the Circle of Willis were acquired using MRA technique without intravenous contrast. COMPARISON:  Same-day noncontrast CT head FINDINGS: MRI HEAD FINDINGS Brain: There is no evidence of acute intracranial hemorrhage, extra-axial fluid collection, or  acute infarct. Parenchymal volume is normal. The ventricles are normal in size. There is no significant burden of white matter microangiopathic change. There is no mass lesion.  There is no midline shift. Vascular: Normal flow voids. Skull and upper cervical spine: Normal marrow signal. Sinuses/Orbits: The paranasal sinuses are clear. The globes and orbits are unremarkable. Other: None. MRA HEAD FINDINGS Anterior circulation: The intracranial ICAs are patent Bilateral MCAs are patent. The bilateral ACAs are patent. There is no aneurysm or AVM. Posterior circulation: The right V4 segment is diminutive particularly after the PICA origin, likely developmental variant. Left V4 segment is widely patent. PICA is identified bilaterally. The basilar artery is patent but diminutive, likely related to the prominent posterior communicating arteries which supply the PCAs, predominantly fetal type bilaterally with a diminutive right P1 segment. The bilateral PCAs are patent. There is no aneurysm or AVM. Anatomic variants: As above. IMPRESSION: 1. No acute intracranial pathology. 2. Patent intracranial vasculature. Electronically Signed   By: Valetta Mole M.D.   On: 09/11/2021 15:35   ECHOCARDIOGRAM COMPLETE  Result Date: 09/11/2021    ECHOCARDIOGRAM REPORT   Patient Name:   [facility] Date of Exam: 09/11/2021 Medical Rec #:  [id number]         Height:       63.0 in Accession #:    [id number]        Weight:       194.6 lb Date of Birth:  06/04/1966          BSA:          1.911 m Patient Age:    56 years          BP:           104/84 mmHg Patient Gender: F                 HR:           74 bpm. Exam Location:  Inpatient Procedure: 2D Echo Indications:    stroke  History:        Patient has prior history of Echocardiogram examinations, most                 recent 10/10/2018. Risk Factors:Dyslipidemia.  Sonographer:    Johny Chess RDCS Referring Phys: [id number] Eunice  1. Left ventricular ejection fraction,  by estimation, is 60 to 65%. The left ventricle has normal function. The left ventricle has no regional wall motion abnormalities. Left ventricular diastolic parameters were normal.  2. Right ventricular systolic function is normal. The right ventricular size is normal.  3. The mitral valve is normal in structure. Trivial mitral valve regurgitation.  4. The aortic valve is tricuspid. Aortic valve regurgitation is not visualized. Aortic valve sclerosis is present, with no evidence of aortic valve stenosis. Comparison(s): The left ventricular function is unchanged. FINDINGS  Left Ventricle: Left ventricular ejection fraction, by estimation, is 60 to 65%. The left ventricle has normal function. The left ventricle has no regional wall motion abnormalities.  The left ventricular internal cavity size was normal in size. There is  no left ventricular hypertrophy. Left ventricular diastolic parameters were normal. Right Ventricle: The right ventricular size is normal. Right vetricular wall thickness was not assessed. Right ventricular systolic function is normal. Left Atrium: Left atrial size was normal in size. Right Atrium: Right atrial size was normal in size. Pericardium: There is no evidence of pericardial effusion. Mitral Valve: The mitral valve is normal in structure. Trivial mitral valve regurgitation. Tricuspid Valve: The tricuspid valve is normal in structure. Tricuspid valve regurgitation is trivial. Aortic Valve: The aortic valve is tricuspid. Aortic valve regurgitation is not visualized. Aortic valve sclerosis is present, with no evidence of aortic valve stenosis. Pulmonic Valve: The pulmonic valve was normal in structure. Pulmonic valve regurgitation is not visualized. Aorta: The aortic root and ascending aorta are structurally normal, with no evidence of dilitation. IAS/Shunts: No atrial level shunt detected by color flow Doppler.  LEFT VENTRICLE PLAX 2D LVIDd:         4.00 cm   Diastology LVIDs:         2.90  cm   LV e' medial:    8.27 cm/s LV PW:         0.90 cm   LV E/e' medial:  8.0 LV IVS:        1.00 cm   LV e' lateral:   13.50 cm/s LVOT diam:     1.90 cm   LV E/e' lateral: 4.9 LV SV:         73 LV SV Index:   38 LVOT Area:     2.84 cm  RIGHT VENTRICLE             IVC RV S prime:     12.70 cm/s  IVC diam: 1.70 cm TAPSE (M-mode): 2.4 cm LEFT ATRIUM             Index        RIGHT ATRIUM           Index LA diam:        3.30 cm 1.73 cm/m   RA Area:     12.40 cm LA Vol (A2C):   39.4 ml 20.61 ml/m  RA Volume:   27.80 ml  14.55 ml/m LA Vol (A4C):   33.8 ml 17.68 ml/m LA Biplane Vol: 37.4 ml 19.57 ml/m  AORTIC VALVE LVOT Vmax:   122.00 cm/s LVOT Vmean:  76.400 cm/s LVOT VTI:    0.257 m  AORTA Ao Root diam: 3.20 cm Ao Asc diam:  3.30 cm MITRAL VALVE MV Area (PHT): 3.37 cm    SHUNTS MV Decel Time: 225 msec    Systemic VTI:  0.26 m MV E velocity: 66.40 cm/s  Systemic Diam: 1.90 cm MV A velocity: 69.00 cm/s MV E/A ratio:  0.96 Dorris Carnes MD Electronically signed by Dorris Carnes MD Signature Date/Time: 09/11/2021/4:26:54 PM    Final    CT HEAD CODE STROKE WO CONTRAST  Result Date: 09/11/2021 CLINICAL DATA:  Code stroke.  Neuro deficit EXAM: CT HEAD WITHOUT CONTRAST TECHNIQUE: Contiguous axial images were obtained from the base of the skull through the vertex without intravenous contrast. RADIATION DOSE REDUCTION: This exam was performed according to the departmental dose-optimization program which includes automated exposure control, adjustment of the mA and/or kV according to patient size and/or use of iterative reconstruction technique. COMPARISON:  None. FINDINGS: Brain: There is no evidence of acute intracranial hemorrhage, extra-axial fluid collection, or acute infarct.  Parenchymal volume is normal. The ventricles are normal in size. There is no mass lesion. There is no midline shift. Vascular: No hyperdense vessel or unexpected calcification. Skull: Normal. Negative for fracture or focal lesion. Sinuses/Orbits: The  imaged paranasal sinuses are clear. The imaged globes and orbits are unremarkable. Other: None. ASPECTS [facility] Stroke Program Early CT Score) - Ganglionic level infarction (caudate, lentiform nuclei, internal capsule, insula, M1-M3 cortex): 7 - Supraganglionic infarction (M4-M6 cortex): 3 Total score (0-10 with 10 being normal): 10 IMPRESSION: 1. No acute intracranial hemorrhage or infarct. 2. ASPECTS is 10 These results were paged via AMION at the time of interpretation on 09/11/2021 at 12:26 pm to provider Dr Theda Sers. Electronically Signed   By: Valetta Mole M.D.   On: 09/11/2021 12:27    PHYSICAL EXAM  Physical Exam  Constitutional: Appears obese, but well-developed and well-nourished, pleasant middle-age Caucasian lady in no acute distress.  Psych: Affect appropriate to situation Cardiovascular: Normal rate and regular rhythm.  Respiratory: Effort normal, non-labored breathing  Neuro: Mental Status: Patient is awake, alert, oriented to person, place, month, year, and situation. Patient is able to give a clear and coherent history. No signs of aphasia or neglect Cranial Nerves: II: Visual Fields are full. Pupils are equal, round, and reactive to light.   III,IV, VI: EOMI without ptosis or diploplia.  V: Facial sensation is symmetric to light touch VII: Facial movement is symmetric resting and smiling, but mild asymmetry of L perioral region while talking. VIII: Hearing is intact to voice X: Palate elevates symmetrically XI: Shoulder shrug is symmetric. XII: Tongue protrudes midline without atrophy or fasciculations.  Motor: Tone is normal. Bulk is normal. 5/5 strength was present in all four extremities.  Sensory: Sensation is symmetric to light touch in the arms and legs. No extinction to DSS present.  Cerebellar: FNF, rapid movements intact bilaterally   ASSESSMENT/PLAN Ms. Emmanuella Mirante is a 56 y.o. female with history of thoracic outlet syndrome and left lower extremity  neuropathy presenting with left side weakness. She reported numbness in her right lower face, right upper extremity, tingling in her fingers and left lower extremity weakness which have all now resolved. Awaiting carotid doppler and PT/OT evaluation. ASA and plavix for three weeks and then ASA alone. She should plan to follow up with neurology in approximately 8 weeks. Recommend sleep apnea workup outpatient.   Posterior circulation TIA likely from secondary small vessel disease . Code Stroke -No acute abnormality.ASPECTS 10.    MRI  No acute intracranial pathology MRA  No acute intracranial pathology 2D Echo EF 60-65%, LV normal function LDL 169 HgbA1c 5.2 VTE prophylaxis - Lovenox No antithrombotic prior to admission, now on aspirin 81 mg daily and clopidogrel 75 mg daily.  For 3 weeks followed by aspirin alone Therapy recommendations:  Pending Disposition:  Pending  Hypertension Home meds:  None Stable Permissive hypertension (OK if < 220/120) but gradually normalize in 5-7 days Long-term BP goal normotensive  Hyperlipidemia Home meds:  None LDL 169, goal < 70 Add Atorvastatin 68m Continue statin at discharge   Other Stroke Risk Factors Obesity, Body mass index is 34.47 kg/m., BMI >/= 30 associated with increased stroke risk, recommend weight loss, diet and exercise as appropriate  Migraines- remote hx of headaches   Hospital day # 0  Patient seen and examined by NP/APP with MD. MD to update note as needed.   DJanine Ores DNP, FNP-BC [facility] Pager: [phone] STROKE MD NOTE : I  have personally obtained history,examined this patient, reviewed notes, independently viewed imaging studies, participated in medical decision making and plan of care.ROS completed by me personally and pertinent positives fully documented  I have made any additions or clarifications directly to the above note. Agree with note above.  Patient presented with dizziness and gait  imbalance and numbness likely from posterior circulation TIA.  She remains at risk for recurrent TIA and stroke.  Continue ongoing stroke work-up.  Recommend dual antiplatelet therapy of aspirin and Plavix for 3 weeks followed by aspirin alone and aggressive risk factor modification.  Discussed with Dr. Thurmond Butts.  Greater than 50% time during this 50-minute visit was spent on counseling and coordination of care about TIA and stroke evaluation, prevention and treatment and answering questions.  Antony Contras, MD Medical Director [facility] Pager: [phone] 09/12/2021 4:32 PM   To contact Stroke Continuity provider, please refer to [email or web address] After hours, contact General Neurology

## 2021-09-12 NOTE — Progress Notes (Signed)
Carotid artery duplex completed. Refer to "CV Proc" under chart review to view preliminary results.  09/12/2021 12:24 PM Kelby Aline., MHA, RVT, RDCS, RDMS

## 2021-09-12 NOTE — Progress Notes (Signed)
°  Transition of Care [facility]) Screening Note   Patient Details  Name: Denise Gamble Date of Birth: 1965-09-10   Transition of Care [facility]) CM/SW Contact:    Geralynn Ochs, LCSW Phone Number: 09/12/2021, 2:59 PM    Transition of Care Department [facility]) has reviewed patient and no TOC needs have been identified at this time. We will continue to monitor patient advancement through interdisciplinary progression rounds. If new patient transition needs arise, please place a TOC consult.

## 2021-09-12 NOTE — Evaluation (Signed)
Occupational Therapy Evaluation Patient Details Name: Denise Gamble MRN: [id number] DOB: 08-Oct-1965 Today's Date: 09/12/2021   History of Present Illness 56 y.o. F admitted on 09/11/21 due to Left sided weakness. MRI and CT are negative. PMH significant for Thoracic Outlet Syndrome, Asthma, GERD, and neuropathy.   Clinical Impression   Pt admitted for concerns listed above. PTA pt reported that she was independent with all ADL's and IADL's, including working. At this time, pt presents near her baseline with no safety concerns. She is able to complete all ADL's and functional mobility independently. Vision and cognition appear WFL and dizziness, per pt report, has stopped. Pt has no further OT needs and acute OT will sign off.      Recommendations for follow up therapy are one component of a multi-disciplinary discharge planning process, led by the attending physician.  Recommendations may be updated based on patient status, additional functional criteria and insurance authorization.   Follow Up Recommendations  No OT follow up    Assistance Recommended at Discharge None  Patient can return home with the following      Functional Status Assessment  Patient has had a recent decline in their functional status and demonstrates the ability to make significant improvements in function in a reasonable and predictable amount of time.  Equipment Recommendations  None recommended by OT    Recommendations for Other Services       Precautions / Restrictions Precautions Precautions: None Restrictions Weight Bearing Restrictions: No      Mobility Bed Mobility Overal bed mobility: Independent                  Transfers Overall transfer level: Modified independent Equipment used: None                      Balance Overall balance assessment: No apparent balance deficits (not formally assessed)                                         ADL either  performed or assessed with clinical judgement   ADL Overall ADL's : At baseline;Modified independent                                       General ADL Comments: Pt demonstrated no difficulties with ADL's or functional mobility this session. Able to simulate dressing and bathing standing in the room, as well as toileting in the bathroom.     Vision Baseline Vision/History: 1 Wears glasses Ability to See in Adequate Light: 0 Adequate Patient Visual Report: Other (comment) (Dizziness) Vision Assessment?: No apparent visual deficits Additional Comments: Pt reported dizziness for the past few days. Today she stated that she has been fine so far, and had no intances of dizziness during session. Tracking, peripherals, saccades, convergence, and functional acuity all appear WFL.     Perception     Praxis      Pertinent Vitals/Pain Pain Assessment Pain Assessment: No/denies pain     Hand Dominance Right   Extremity/Trunk Assessment Upper Extremity Assessment Upper Extremity Assessment: Overall WFL for tasks assessed   Lower Extremity Assessment Lower Extremity Assessment: Overall WFL for tasks assessed   Cervical / Trunk Assessment Cervical / Trunk Assessment: Normal   Communication Communication Communication: No difficulties   Cognition Arousal/Alertness:  Awake/alert Behavior During Therapy: WFL for tasks assessed/performed Overall Cognitive Status: Within Functional Limits for tasks assessed                                       General Comments  VSS on RA    Exercises     Shoulder Instructions      Home Living Family/patient expects to be discharged to:: Private residence Living Arrangements: Spouse/significant other Available Help at Discharge: Family Type of Home: House Home Access: Stairs to enter [employer] of Steps: 2-3 steps Entrance Stairs-Rails: Right;Left (Depends on which door she goes in.) Home Layout: One  level     Bathroom Shower/Tub: Teacher, early years/pre: Standard     Home Equipment: None          Prior Functioning/Environment Prior Level of Function : Independent/Modified Independent;Working/employed;Driving                        OT Problem List: Decreased strength;Decreased activity tolerance;Impaired balance (sitting and/or standing)      OT Treatment/Interventions:      OT Goals(Current goals can be found in the care plan section) Acute Rehab OT Goals Patient Stated Goal: To go home OT Goal Formulation: With patient Time For Goal Achievement: 09/12/21 Potential to Achieve Goals: Good  OT Frequency:      Co-evaluation              AM-PAC OT "6 Clicks" Daily Activity     Outcome Measure Help from another person eating meals?: None Help from another person taking care of personal grooming?: None Help from another person toileting, which includes using toliet, bedpan, or urinal?: None Help from another person bathing (including washing, rinsing, drying)?: None Help from another person to put on and taking off regular upper body clothing?: None Help from another person to put on and taking off regular lower body clothing?: None 6 Click Score: 24   End of Session Nurse Communication: Mobility status  Activity Tolerance: Patient tolerated treatment well Patient left: in bed;with call bell/phone within reach  OT Visit Diagnosis: Unsteadiness on feet (R26.81);Other abnormalities of gait and mobility (R26.89);Muscle weakness (generalized) (M62.81)                Time: 9678-9381 OT Time Calculation (min): 33 min Charges:  OT General Charges $OT Visit: 1 Visit OT Evaluation $OT Eval Moderate Complexity: 1 Mod OT Treatments $Self Care/Home Management : 8-22 mins  Kazuto Sevey H., OTR/L Acute Rehabilitation  Luca Burston Elane Kem Hensen 09/12/2021, 1:14 PM

## 2021-09-12 NOTE — Progress Notes (Signed)
PT Cancellation Note  Patient Details Name: Denise Gamble MRN: [id number] DOB: 09-Jun-1966   Cancelled Treatment:    Reason Eval/Treat Not Completed: PT screened, no needs identified, will sign off. PT checked on pt while we was off unit for ultrasound. When PT checked back in the afternoon, MD present in room for discharge. Per MD, PT did not need to evaluate patient. Will sign off. If needs change, please reconsult.    Thelma Comp 09/12/2021, 3:23 PM  Rolinda Roan, PT, DPT Acute Rehabilitation Services Pager: [phone] Office: [phone]

## 2021-09-12 NOTE — Discharge Summary (Signed)
Physician Discharge Summary   Patient: Denise Gamble MRN: [id number] DOB: 1966/05/08  Admit date:     09/11/2021  Discharge date: 09/12/21  Discharge Physician: Patrecia Pour   PCP: [name], PA-C   Recommendations at discharge:  Recommend sleep study as outpatient Monitor LFTs and lipid panel, starting high-intensity statin at discharge with LDL 169.  Follow up with neurology in 6-8 weeks for post TIA care. Discharging on DAPT x3 weeks, then aspirin 74m alone.  Discharge Diagnoses: Principal Problem:   Acute left-sided weakness due to TIA Active Problems:   Asthma   Peripheral neuropathy   Thoracic outlet syndrome   Hyperlipidemia  Hospital Course: Per HPI: Denise Macdonnellis a 56y.o. female with medical history significant of bilateral thoracic outlet syndrome and left lower extremity neuropathy, asthma, HLD who presented to Ed with complaints of left sided weakness.  She was walking her dog around 10:00am and had sudden onset dizziness and soon after she got home she had left sided facial numbness.  She called her husband and PCP.  She then had her left arm get heavy and tingling. Her legs felt heavy and week. She then called 911 around 11am. She has a history of thoracic outlet syndrome, but these symptoms in her left arm feel different from this as she only has tingling when she raises above her head. She continued to be dizzy.  Her symptoms have improved, but her cheek still feels "funny" and her arm is still not back to normal. She had no slurred speech or facial drooping. Gait was normal. She is still a little dizzy, but much better.  She denies any headaches or vision changes. No ear pain or ringing.    No fever/chills. No headaches or vision changes, no chest pain or palpitations, no shortness of breath or cough, no stomach pain or N/V/D, no dysuria or leg swelling.    +CVA in her sister, no other family history. She does not have diabetes, HTN, any smoking or  drinking history.    ER Course:   Vitals: bp: 123/62, HR: 87, RR: 18, oxygen:99%RA Pertinent labs: none CTH: no acute finding. In ed code stroke initiated. Neurology consulted. TRH asked to admit. Given ASA 3231m   Hospital Course: MRI and MRA were negative for significant or acute findings. Carotid U/S revealed no significant stenosis. PT and OT have recommended no continued follow up as her symptoms have significantly improved. Neurology has recommended initiating DAPT for 3 weeks, then aspirin alone as well as initiation of statin therapy.   Assessment and Plan: * Acute left-sided weakness [age]ear old female with sudden onset dizziness and left sided weakness and facial abnormal sensation. Symptoms have improved, no acute findings on MRI, MRA, carotid U/S, echocardiogram.  - DAPT x3 weeks, then ASA, start statin, f/u w/neurology in 6-8 weeks for TIA  Hyperlipidemia- (present on admission) On no medication. Last lipid panel in 5/22 showed LDL over 180, now 169, though HDL good at 50.  - Recommend continued shared-decision making. High intensity statin was recommended by neurology and prescribed. 10 year ASCVD score is 2.9%.   Thoracic outlet syndrome- (present on admission) Stable, diagnosed in her 2056sOpted not for intervention.  Symptoms with arms above the head movement If worsening symptoms>outpatient f/u   Asthma- (present on admission) Stable, continue albuterol prn and advair prn  singulair at night   Consultants: Neurology Procedures performed: None  Disposition: Home Diet recommendation:  Cardiac diet  DISCHARGE MEDICATION: Allergies  as of 09/12/2021       Reactions   Pollen Extract Hives   All tree pollens and dust mites   Latex Hives        Medication List     TAKE these medications    Advair HFA 45-21 MCG/ACT inhaler Generic drug: fluticasone-salmeterol USE 2 INHALATIONS TWICE A DAY What changed: See the new instructions.   albuterol 108 (90 Base)  MCG/ACT inhaler Commonly known as: VENTOLIN HFA USE 2 INHALATIONS EVERY 4 HOURS AS NEEDED FOR WHEEZING OR SHORTNESS OF BREATH What changed: See the new instructions.   aspirin 81 MG EC tablet Take 1 tablet (81 mg total) by mouth daily. Start taking on: September 13, 2021   atorvastatin 80 MG tablet Commonly known as: LIPITOR Take 1 tablet (80 mg total) by mouth daily. Start taking on: September 13, 2021   cetirizine 10 MG tablet Commonly known as: ZYRTEC Take 10 mg by mouth daily.   cholecalciferol 25 MCG (1000 UNIT) tablet Commonly known as: VITAMIN D3 Take 1,000 Units by mouth daily.   clopidogrel 75 MG tablet Commonly known as: PLAVIX Take 1 tablet (75 mg total) by mouth daily for 21 days. Start taking on: September 13, 2021   EQL Omega 3 Fish Oil 1000 MG Cpdr Take 1,000 mg by mouth daily.   ibuprofen 200 MG tablet Commonly known as: ADVIL Take 400 mg by mouth every 6 (six) hours as needed for mild pain.   magnesium gluconate 500 MG tablet Commonly known as: MAGONATE Take 500 mg by mouth 2 (two) times a week.   montelukast 10 MG tablet Commonly known as: SINGULAIR TAKE 1 TABLET AT BEDTIME   vitamin B-12 1000 MCG tablet Commonly known as: CYANOCOBALAMIN Take 1,000 mcg by mouth daily.        Follow-up Information     [name], PA-C. Schedule an appointment as soon as possible for a visit in 2 week(s).   Specialty: Physician Assistant Contact information: [place] [id number] [phone]         Garvin Fila, MD Follow up.   Specialties: Neurology, Radiology Contact information: [address] [place] [address] [phone]                 Discharge Exam: Danley Danker Weights   09/11/21 1223  Weight: 88.3 kg   Nontoxic, no distress RRR Clear, nonlabored Obese, NT ND Alert, oriented, subtle left facial droop that pt's husband has mentioned chronically. Abnormal sensation in ~V2 distribution  on left, otherwise sensorimotor intact.   Condition at discharge: good  The results of significant diagnostics from this hospitalization (including imaging, microbiology, ancillary and laboratory) are listed below for reference.   Imaging Studies: MR ANGIO HEAD WO CONTRAST  Result Date: 09/11/2021 CLINICAL DATA:  Left-sided weakness EXAM: MRI HEAD WITHOUT CONTRAST MRA HEAD WITHOUT CONTRAST TECHNIQUE: Multiplanar, multi-echo pulse sequences of the brain and surrounding structures were acquired without intravenous contrast. Angiographic images of the Circle of Willis were acquired using MRA technique without intravenous contrast. COMPARISON:  Same-day noncontrast CT head FINDINGS: MRI HEAD FINDINGS Brain: There is no evidence of acute intracranial hemorrhage, extra-axial fluid collection, or acute infarct. Parenchymal volume is normal. The ventricles are normal in size. There is no significant burden of white matter microangiopathic change. There is no mass lesion.  There is no midline shift. Vascular: Normal flow voids. Skull and upper cervical spine: Normal marrow signal. Sinuses/Orbits: The paranasal sinuses are clear. The  globes and orbits are unremarkable. Other: None. MRA HEAD FINDINGS Anterior circulation: The intracranial ICAs are patent Bilateral MCAs are patent. The bilateral ACAs are patent. There is no aneurysm or AVM. Posterior circulation: The right V4 segment is diminutive particularly after the PICA origin, likely developmental variant. Left V4 segment is widely patent. PICA is identified bilaterally. The basilar artery is patent but diminutive, likely related to the prominent posterior communicating arteries which supply the PCAs, predominantly fetal type bilaterally with a diminutive right P1 segment. The bilateral PCAs are patent. There is no aneurysm or AVM. Anatomic variants: As above. IMPRESSION: 1. No acute intracranial pathology. 2. Patent intracranial vasculature. Electronically Signed    By: Valetta Mole M.D.   On: 09/11/2021 15:35   MR BRAIN WO CONTRAST  Result Date: 09/11/2021 CLINICAL DATA:  Left-sided weakness EXAM: MRI HEAD WITHOUT CONTRAST MRA HEAD WITHOUT CONTRAST TECHNIQUE: Multiplanar, multi-echo pulse sequences of the brain and surrounding structures were acquired without intravenous contrast. Angiographic images of the Circle of Willis were acquired using MRA technique without intravenous contrast. COMPARISON:  Same-day noncontrast CT head FINDINGS: MRI HEAD FINDINGS Brain: There is no evidence of acute intracranial hemorrhage, extra-axial fluid collection, or acute infarct. Parenchymal volume is normal. The ventricles are normal in size. There is no significant burden of white matter microangiopathic change. There is no mass lesion.  There is no midline shift. Vascular: Normal flow voids. Skull and upper cervical spine: Normal marrow signal. Sinuses/Orbits: The paranasal sinuses are clear. The globes and orbits are unremarkable. Other: None. MRA HEAD FINDINGS Anterior circulation: The intracranial ICAs are patent Bilateral MCAs are patent. The bilateral ACAs are patent. There is no aneurysm or AVM. Posterior circulation: The right V4 segment is diminutive particularly after the PICA origin, likely developmental variant. Left V4 segment is widely patent. PICA is identified bilaterally. The basilar artery is patent but diminutive, likely related to the prominent posterior communicating arteries which supply the PCAs, predominantly fetal type bilaterally with a diminutive right P1 segment. The bilateral PCAs are patent. There is no aneurysm or AVM. Anatomic variants: As above. IMPRESSION: 1. No acute intracranial pathology. 2. Patent intracranial vasculature. Electronically Signed   By: Valetta Mole M.D.   On: 09/11/2021 15:35   ECHOCARDIOGRAM COMPLETE  Result Date: 09/11/2021    ECHOCARDIOGRAM REPORT   Patient Name:   [facility] Date of Exam: 09/11/2021 Medical Rec #:   [id number]         Height:       63.0 in Accession #:    [id number]        Weight:       194.6 lb Date of Birth:  Jan 22, 1966          BSA:          1.911 m Patient Age:    40 years          BP:           104/84 mmHg Patient Gender: F                 HR:           74 bpm. Exam Location:  Inpatient Procedure: 2D Echo Indications:    stroke  History:        Patient has prior history of Echocardiogram examinations, most                 recent 10/10/2018. Risk Factors:Dyslipidemia.  Sonographer:    Ray Referring Phys:  [id number] WOLFE IMPRESSIONS  1. Left ventricular ejection fraction, by estimation, is 60 to 65%. The left ventricle has normal function. The left ventricle has no regional wall motion abnormalities. Left ventricular diastolic parameters were normal.  2. Right ventricular systolic function is normal. The right ventricular size is normal.  3. The mitral valve is normal in structure. Trivial mitral valve regurgitation.  4. The aortic valve is tricuspid. Aortic valve regurgitation is not visualized. Aortic valve sclerosis is present, with no evidence of aortic valve stenosis. Comparison(s): The left ventricular function is unchanged. FINDINGS  Left Ventricle: Left ventricular ejection fraction, by estimation, is 60 to 65%. The left ventricle has normal function. The left ventricle has no regional wall motion abnormalities. The left ventricular internal cavity size was normal in size. There is  no left ventricular hypertrophy. Left ventricular diastolic parameters were normal. Right Ventricle: The right ventricular size is normal. Right vetricular wall thickness was not assessed. Right ventricular systolic function is normal. Left Atrium: Left atrial size was normal in size. Right Atrium: Right atrial size was normal in size. Pericardium: There is no evidence of pericardial effusion. Mitral Valve: The mitral valve is normal in structure. Trivial mitral valve regurgitation. Tricuspid Valve:  The tricuspid valve is normal in structure. Tricuspid valve regurgitation is trivial. Aortic Valve: The aortic valve is tricuspid. Aortic valve regurgitation is not visualized. Aortic valve sclerosis is present, with no evidence of aortic valve stenosis. Pulmonic Valve: The pulmonic valve was normal in structure. Pulmonic valve regurgitation is not visualized. Aorta: The aortic root and ascending aorta are structurally normal, with no evidence of dilitation. IAS/Shunts: No atrial level shunt detected by color flow Doppler.  LEFT VENTRICLE PLAX 2D LVIDd:         4.00 cm   Diastology LVIDs:         2.90 cm   LV e' medial:    8.27 cm/s LV PW:         0.90 cm   LV E/e' medial:  8.0 LV IVS:        1.00 cm   LV e' lateral:   13.50 cm/s LVOT diam:     1.90 cm   LV E/e' lateral: 4.9 LV SV:         73 LV SV Index:   38 LVOT Area:     2.84 cm  RIGHT VENTRICLE             IVC RV S prime:     12.70 cm/s  IVC diam: 1.70 cm TAPSE (M-mode): 2.4 cm LEFT ATRIUM             Index        RIGHT ATRIUM           Index LA diam:        3.30 cm 1.73 cm/m   RA Area:     12.40 cm LA Vol (A2C):   39.4 ml 20.61 ml/m  RA Volume:   27.80 ml  14.55 ml/m LA Vol (A4C):   33.8 ml 17.68 ml/m LA Biplane Vol: 37.4 ml 19.57 ml/m  AORTIC VALVE LVOT Vmax:   122.00 cm/s LVOT Vmean:  76.400 cm/s LVOT VTI:    0.257 m  AORTA Ao Root diam: 3.20 cm Ao Asc diam:  3.30 cm MITRAL VALVE MV Area (PHT): 3.37 cm    SHUNTS MV Decel Time: 225 msec    Systemic VTI:  0.26 m MV E velocity: 66.40 cm/s  Systemic Diam: 1.90  cm MV A velocity: 69.00 cm/s MV E/A ratio:  0.96 Dorris Carnes MD Electronically signed by Dorris Carnes MD Signature Date/Time: 09/11/2021/4:26:54 PM    Final    CT HEAD CODE STROKE WO CONTRAST  Result Date: 09/11/2021 CLINICAL DATA:  Code stroke.  Neuro deficit EXAM: CT HEAD WITHOUT CONTRAST TECHNIQUE: Contiguous axial images were obtained from the base of the skull through the vertex without intravenous contrast. RADIATION DOSE REDUCTION: This exam  was performed according to the departmental dose-optimization program which includes automated exposure control, adjustment of the mA and/or kV according to patient size and/or use of iterative reconstruction technique. COMPARISON:  None. FINDINGS: Brain: There is no evidence of acute intracranial hemorrhage, extra-axial fluid collection, or acute infarct. Parenchymal volume is normal. The ventricles are normal in size. There is no mass lesion. There is no midline shift. Vascular: No hyperdense vessel or unexpected calcification. Skull: Normal. Negative for fracture or focal lesion. Sinuses/Orbits: The imaged paranasal sinuses are clear. The imaged globes and orbits are unremarkable. Other: None. ASPECTS [facility] Stroke Program Early CT Score) - Ganglionic level infarction (caudate, lentiform nuclei, internal capsule, insula, M1-M3 cortex): 7 - Supraganglionic infarction (M4-M6 cortex): 3 Total score (0-10 with 10 being normal): 10 IMPRESSION: 1. No acute intracranial hemorrhage or infarct. 2. ASPECTS is 10 These results were paged via AMION at the time of interpretation on 09/11/2021 at 12:26 pm to provider Dr Theda Sers. Electronically Signed   By: Valetta Mole M.D.   On: 09/11/2021 12:27   VAS US CAROTID  Result Date: 09/12/2021 Carotid Arterial Duplex Study Patient Name:  [facility]  Date of Exam:   09/12/2021 Medical Rec #: [id number]          Accession #:    [id number] Date of Birth: Oct 30, 1965           Patient Gender: F Patient Age:   51 years Exam Location:  [facility] Procedure:      VAS US CAROTID Referring Phys: Janine Ores --------------------------------------------------------------------------------  Indications:       TIA, Numbness and Weakness. Risk Factors:      None. Comparison Study:  No prior study Performing Technologist: Maudry Mayhew MHA, RDMS, RVT, RDCS  Examination Guidelines: A complete evaluation includes B-mode imaging, spectral Doppler, color Doppler, and power Doppler  as needed of all accessible portions of each vessel. Bilateral testing is considered an integral part of a complete examination. Limited examinations for reoccurring indications may be performed as noted.  Right Carotid Findings: +----------+--------+-------+--------+----------------------+------------------+             PSV cm/s EDV     Stenosis Plaque Description     Comments                                 cm/s                                                        +----------+--------+-------+--------+----------------------+------------------+  CCA Prox   70       21                                                          +----------+--------+-------+--------+----------------------+------------------+  CCA Distal 61       19                                      intimal thickening  +----------+--------+-------+--------+----------------------+------------------+  ICA Prox   50       20                                                          +----------+--------+-------+--------+----------------------+------------------+  ICA Distal 123      38                                                          +----------+--------+-------+--------+----------------------+------------------+  ECA        90       19               smooth and                                                                       heterogenous                               +----------+--------+-------+--------+----------------------+------------------+ +----------+--------+-------+----------------+-------------------+             PSV cm/s EDV cms Describe         Arm Pressure (mmHG)  +----------+--------+-------+----------------+-------------------+  Subclavian 99               Multiphasic, WNL                      +----------+--------+-------+----------------+-------------------+ +---------+--------+--+--------+--+----------+  Vertebral PSV cm/s 40 EDV cm/s 14 Retrograde  +---------+--------+--+--------+--+----------+  Left Carotid Findings:  +----------+--------+--------+--------+-----------------------+--------+             PSV cm/s EDV cm/s Stenosis Plaque Description      Comments  +----------+--------+--------+--------+-----------------------+--------+  CCA Prox   88       23                                                  +----------+--------+--------+--------+-----------------------+--------+  CCA Distal 56       20                                                  +----------+--------+--------+--------+-----------------------+--------+  ICA Prox   37       12                                                  +----------+--------+--------+--------+-----------------------+--------+  ICA Distal 90       40                                                  +----------+--------+--------+--------+-----------------------+--------+  ECA        87       16                smooth and heterogenous           +----------+--------+--------+--------+-----------------------+--------+ +----------+--------+--------+----------------+-------------------+             PSV cm/s EDV cm/s Describe         Arm Pressure (mmHG)  +----------+--------+--------+----------------+-------------------+  Subclavian 98                Multiphasic, WNL                      +----------+--------+--------+----------------+-------------------+ +---------+--------+--+--------+--+---------+  Vertebral PSV cm/s 43 EDV cm/s 18 Antegrade  +---------+--------+--+--------+--+---------+   Summary: Right Carotid: The extracranial vessels were near-normal with only minimal wall                thickening or plaque. Left Carotid: The extracranial vessels were near-normal with only minimal wall               thickening or plaque. Vertebrals:  Bilateral vertebral arteries demonstrate antegrade flow. Subclavians: Normal flow hemodynamics were seen in bilateral subclavian              arteries. *See table(s) above for measurements and observations.  Electronically signed by Antony Contras MD on 09/12/2021 at  12:54:42 PM.    Final     Microbiology: Results for orders placed or performed during the hospital encounter of 09/11/21  Resp Panel by RT-PCR (Flu A&B, Covid) Nasopharyngeal Swab     Status: None   Collection Time: 09/11/21 12:14 PM   Specimen: Nasopharyngeal Swab; Nasopharyngeal(NP) swabs in vial transport medium  Result Value Ref Range Status   SARS Coronavirus 2 by RT PCR NEGATIVE NEGATIVE Final    Comment: (NOTE) SARS-CoV-2 target nucleic acids are NOT DETECTED.  The SARS-CoV-2 RNA is generally detectable in upper respiratory specimens during the acute phase of infection. The lowest concentration of SARS-CoV-2 viral copies this assay can detect is 138 copies/mL. A negative result does not preclude SARS-Cov-2 infection and should not be used as the sole basis for treatment or other patient management decisions. A negative result may occur with  improper specimen collection/handling, submission of specimen other than nasopharyngeal swab, presence of viral mutation(s) within the areas targeted by this assay, and inadequate number of viral copies(<138 copies/mL). A negative result must be combined with clinical observations, patient history, and epidemiological information. The expected result is Negative.  Fact Sheet for Patients:  EntrepreneurPulse.com.au  Fact Sheet for Healthcare Providers:  IncredibleEmployment.be  This test is no t yet approved or cleared by the [place] FDA and  has been authorized for detection and/or diagnosis of SARS-CoV-2 by FDA under an Emergency Use Authorization (EUA). This EUA will remain  in effect (meaning this test can be used) for the duration of the COVID-19 declaration under Section 564(b)(1) of the Act, 21 U.S.C.section 360bbb-3(b)(1), unless the authorization is terminated  or revoked sooner.       Influenza A by PCR NEGATIVE NEGATIVE Final   Influenza B by  PCR NEGATIVE NEGATIVE Final     Comment: (NOTE) The Xpert Xpress SARS-CoV-2/FLU/RSV plus assay is intended as an aid in the diagnosis of influenza from Nasopharyngeal swab specimens and should not be used as a sole basis for treatment. Nasal washings and aspirates are unacceptable for Xpert Xpress SARS-CoV-2/FLU/RSV testing.  Fact Sheet for Patients: EntrepreneurPulse.com.au  Fact Sheet for Healthcare Providers: IncredibleEmployment.be  This test is not yet approved or cleared by the [place] FDA and has been authorized for detection and/or diagnosis of SARS-CoV-2 by FDA under an Emergency Use Authorization (EUA). This EUA will remain in effect (meaning this test can be used) for the duration of the COVID-19 declaration under Section 564(b)(1) of the Act, 21 U.S.C. section 360bbb-3(b)(1), unless the authorization is terminated or revoked.  Performed at [facility], [place] [address] [place], [place] [address]     Labs: CBC: Recent Labs  Lab 09/11/21 1200 09/11/21 1215  WBC 7.0  --   NEUTROABS 4.2  --   HGB 13.3 14.3  HCT 42.0 42.0  MCV 98.4  --   PLT 253  --    Basic Metabolic Panel: Recent Labs  Lab 09/11/21 1200 09/11/21 1215  NA 139 141  K 3.8 3.6  CL 106 105  CO2 23  --   GLUCOSE 90 90  BUN 11 12  CREATININE 0.90 0.80  CALCIUM 8.8*  --    Liver Function Tests: Recent Labs  Lab 09/11/21 1200  AST 20  ALT 15  ALKPHOS 102  BILITOT 0.5  PROT 7.0  ALBUMIN 3.6   CBG: Recent Labs  Lab 09/11/21 1205  GLUCAP 100*    Discharge time spent: greater than 30 minutes.  Signed: Patrecia Pour, MD [facility] 09/12/2021

## 2021-09-12 NOTE — Progress Notes (Signed)
Pt discharged to home with husband at her side. MD in to visit, answered all Pt's and her husbands' questions. Progress and plan also discussed. Discharge papers given, teaching provided, upcoming appointments/medications discussed and material given to husband.

## 2021-09-18 ENCOUNTER — Inpatient Hospital Stay: Payer: Managed Care, Other (non HMO) | Admitting: Physician Assistant

## 2021-09-19 ENCOUNTER — Other Ambulatory Visit: Payer: Self-pay

## 2021-09-19 ENCOUNTER — Ambulatory Visit (INDEPENDENT_AMBULATORY_CARE_PROVIDER_SITE_OTHER): Payer: Managed Care, Other (non HMO) | Admitting: Physician Assistant

## 2021-09-19 ENCOUNTER — Encounter: Payer: Self-pay | Admitting: Physician Assistant

## 2021-09-19 VITALS — BP 113/75 | HR 76 | Temp 98.0°F | Ht 63.0 in | Wt 233.0 lb

## 2021-09-19 DIAGNOSIS — J4520 Mild intermittent asthma, uncomplicated: Secondary | ICD-10-CM | POA: Diagnosis not present

## 2021-09-19 DIAGNOSIS — Z09 Encounter for follow-up examination after completed treatment for conditions other than malignant neoplasm: Secondary | ICD-10-CM

## 2021-09-19 DIAGNOSIS — E785 Hyperlipidemia, unspecified: Secondary | ICD-10-CM | POA: Diagnosis not present

## 2021-09-19 DIAGNOSIS — G540 Brachial plexus disorders: Secondary | ICD-10-CM

## 2021-09-19 DIAGNOSIS — R319 Hematuria, unspecified: Secondary | ICD-10-CM

## 2021-09-19 DIAGNOSIS — R531 Weakness: Secondary | ICD-10-CM | POA: Diagnosis not present

## 2021-09-19 DIAGNOSIS — R42 Dizziness and giddiness: Secondary | ICD-10-CM

## 2021-09-19 LAB — POCT URINALYSIS DIPSTICK
Bilirubin, UA: NEGATIVE
Glucose, UA: NEGATIVE
Ketones, UA: NEGATIVE
Nitrite, UA: NEGATIVE
Protein, UA: NEGATIVE
Spec Grav, UA: 1.01 (ref 1.010–1.025)
Urobilinogen, UA: 0.2 E.U./dL
pH, UA: 5.5 (ref 5.0–8.0)

## 2021-09-19 NOTE — Progress Notes (Signed)
Established Patient Office Visit  Subjective:  Patient ID: Denise Gamble, female    DOB: 09-Jul-1966  Age: 56 y.o. MRN: [id number]  CC:  Chief Complaint  Patient presents with   Follow-up    HPI Denise Gamble presents for hospital follow-up. Patient reports having motions sickness which is triggered with computer/laptop or handwriting. Laying down helps with the motion sickness. States her left arm had lightening bolt sensation when she had venipuncture down and continues to have weird sensations of left arm. Denies re-occurrence of facial numbness or grimacing. No chest pain, headache, shortness of breath, syncope, palpitations or slurred speech.    Discharge summary: Admit date:     09/11/2021  Discharge date: 09/12/21  Discharge Physician: Patrecia Pour    PCP: [name], PA-C    Recommendations at discharge:  Recommend sleep study as outpatient Monitor LFTs and lipid panel, starting high-intensity statin at discharge with LDL 169.  Follow up with neurology in 6-8 weeks for post TIA care. Discharging on DAPT x3 weeks, then aspirin 13m alone.   Discharge Diagnoses: Principal Problem:   Acute left-sided weakness due to TIA Active Problems:   Asthma   Peripheral neuropathy   Thoracic outlet syndrome   Hyperlipidemia   Hospital Course: Per HPI: [name] a 56y.o. female with medical history significant of bilateral thoracic outlet syndrome and left lower extremity neuropathy, asthma, HLD who presented to Ed with complaints of left sided weakness.  She was walking her dog around 10:00am and had sudden onset dizziness and soon after she got home she had left sided facial numbness.  She called her husband and PCP.  She then had her left arm get heavy and tingling. Her legs felt heavy and week. She then called 911 around 11am. She has a history of thoracic outlet syndrome, but these symptoms in her left arm feel different from this as she only has tingling when  she raises above her head. She continued to be dizzy.  Her symptoms have improved, but her cheek still feels "funny" and her arm is still not back to normal. She had no slurred speech or facial drooping. Gait was normal. She is still a little dizzy, but much better.  She denies any headaches or vision changes. No ear pain or ringing.    No fever/chills. No headaches or vision changes, no chest pain or palpitations, no shortness of breath or cough, no stomach pain or N/V/D, no dysuria or leg swelling.    +CVA in her sister, no other family history. She does not have diabetes, HTN, any smoking or drinking history.    ER Course:   Vitals: bp: 123/62, HR: 87, RR: 18, oxygen:99%RA Pertinent labs: none CTH: no acute finding. In ed code stroke initiated. Neurology consulted. TRH asked to admit. Given ASA 3248m    Hospital Course: MRI and MRA were negative for significant or acute findings. Carotid U/S revealed no significant stenosis. PT and OT have recommended no continued follow up as her symptoms have significantly improved. Neurology has recommended initiating DAPT for 3 weeks, then aspirin alone as well as initiation of statin therapy.    Assessment and Plan: * Acute left-sided weakness [age]ear old female with sudden onset dizziness and left sided weakness and facial abnormal sensation. Symptoms have improved, no acute findings on MRI, MRA, carotid U/S, echocardiogram.  - DAPT x3 weeks, then ASA, start statin, f/u w/neurology in 6-8 weeks for TIA   Hyperlipidemia- (present on  admission) On no medication. Last lipid panel in 5/22 showed LDL over 180, now 169, though HDL good at 50.  - Recommend continued shared-decision making. High intensity statin was recommended by neurology and prescribed. 10 year ASCVD score is 2.9%.     Thoracic outlet syndrome- (present on admission) Stable, diagnosed in her 45s. Opted not for intervention.  Symptoms with arms above the head movement If worsening  symptoms>outpatient f/u    Asthma- (present on admission) Stable, continue albuterol prn and advair prn  singulair at night    Consultants: Neurology Procedures performed: None  Disposition: Home Diet recommendation:  Cardiac diet   Past Medical History:  Diagnosis Date   Allergy    seasonal allergies   Asthma    adult onset (1998)   GERD (gastroesophageal reflux disease)    for PRN use   History of MRSA infection    Miscarriage 2013   multiple    Paresthesia of lower extremity    Peripheral neuropathy 2019   left foot   Plantar fasciitis of left foot    Thoracic outlet syndrome    Urinary incontinence     Past Surgical History:  Procedure Laterality Date   DILATION AND CURETTAGE OF UTERUS  2011   LYMPHADENECTOMY Left 1979   WISDOM TOOTH EXTRACTION      Family History  Problem Relation Age of Onset   Heart disease Mother 38       epstein anolmay    Dementia Mother    Colon polyps Mother    Diverticulitis Mother    Colon cancer Father 2   Skin cancer Father        ? thinks melanoma?    Leukemia Father        AAL   Hyperlipidemia Father    Hypertension Father    Colon polyps Father 66   Heart disease Brother    Alcohol abuse Brother    Diabetes Brother    Hypertension Brother    Prostate cancer Brother    Coronary artery disease Brother    Colon polyps Brother    Hyperlipidemia Sister    Hypertension Sister    Stroke Sister 34   Brain cancer Sister 18       mets from lung   Lung cancer Sister 12       mets to brain-then stroke   Heart attack Maternal Grandfather    Hypertension Sister    Hypertension Sister    Colon polyps Brother 15   Alzheimer's disease Maternal Aunt    Breast cancer Neg Hx    Esophageal cancer Neg Hx    Stomach cancer Neg Hx    Rectal cancer Neg Hx     Social History   Socioeconomic History   Marital status: Married    Spouse name: Eddie Dibbles   Number of children: 0   Years of education: Not on file   Highest education  level: Master's degree (e.g., MA, MS, MEng, MEd, MSW, MBA)  Occupational History   Not on file  Tobacco Use   Smoking status: Never   Smokeless tobacco: Never  Vaping Use   Vaping Use: Never used  Substance and Sexual Activity   Alcohol use: No    Alcohol/week: 0.0 standard drinks    Comment: quit 2010   Drug use: No   Sexual activity: Yes    Comment: perimenopausal  Other Topics Concern   Not on file  Social History Narrative   Works for [employer]  Goes to Employee health   Married   No caffeine            Social Determinants of Radio broadcast assistant Strain: Not on file  Food Insecurity: Not on file  Transportation Needs: Not on file  Physical Activity: Not on file  Stress: Not on file  Social Connections: Not on file  Intimate Partner Violence: Not on file    Outpatient Medications Prior to Visit  Medication Sig Dispense Refill   ADVAIR HFA 45-21 MCG/ACT inhaler USE 2 INHALATIONS TWICE A DAY (Patient taking differently: 2 puffs 2 (two) times daily as needed (shortness of breath).) 12 g 11   albuterol (VENTOLIN HFA) 108 (90 Base) MCG/ACT inhaler USE 2 INHALATIONS EVERY 4 HOURS AS NEEDED FOR WHEEZING OR SHORTNESS OF BREATH (Patient taking differently: 2 puffs every 4 (four) hours as needed for wheezing or shortness of breath.) 17 g 1   aspirin EC 81 MG EC tablet Take 1 tablet (81 mg total) by mouth daily. 30 tablet 1   atorvastatin (LIPITOR) 80 MG tablet Take 1 tablet (80 mg total) by mouth daily. 30 tablet 1   cetirizine (ZYRTEC) 10 MG tablet Take 10 mg by mouth daily.     cholecalciferol (VITAMIN D3) 25 MCG (1000 UNIT) tablet Take 1,000 Units by mouth daily.     clopidogrel (PLAVIX) 75 MG tablet Take 1 tablet (75 mg total) by mouth daily for 21 days. 21 tablet 0   clotrimazole-betamethasone (LOTRISONE) cream SMARTSIG:In Ear(s)     montelukast (SINGULAIR) 10 MG tablet TAKE 1 TABLET AT BEDTIME (Patient taking differently: Take 10 mg by mouth at bedtime.) 90  tablet 1   Omega-3 Fatty Acids (EQL OMEGA 3 FISH OIL) 1000 MG CPDR Take 1,000 mg by mouth daily.     vitamin B-12 (CYANOCOBALAMIN) 1000 MCG tablet Take 1,000 mcg by mouth daily.     ibuprofen (ADVIL) 200 MG tablet Take 400 mg by mouth every 6 (six) hours as needed for mild pain.     magnesium gluconate (MAGONATE) 500 MG tablet Take 500 mg by mouth 2 (two) times a week.     No facility-administered medications prior to visit.    Allergies  Allergen Reactions   Pollen Extract Hives    All tree pollens and dust mites   Latex Hives    ROS Review of Systems Review of Systems:  A fourteen system review of systems was performed and found to be positive as per HPI.    Objective:    Physical Exam General:  Well Developed, well nourished, appropriate for stated age.  Neuro:  Alert and oriented,  extra-ocular muscles intact  HEENT:  Normocephalic, atraumatic, neck supple, slight nystagmus noted with peripheral gaze, no facial drooping or speech impairment  Skin:  no gross rash, warm, pink. Cardiac:  RRR, S1 S2 Respiratory: CTA B/L  Vascular:  Ext warm, no cyanosis apprec.; cap RF less 2 sec. Psych:  No HI/SI, judgement and insight good, Euthymic mood. Full Affect.  BP 113/75    Pulse 76    Temp 98 F (36.7 C)    Ht 5' 3"  (1.6 m)    Wt 233 lb (105.7 kg)    LMP 03/29/2019    SpO2 98%    BMI 41.27 kg/m  Wt Readings from Last 3 Encounters:  09/19/21 233 lb (105.7 kg)  09/11/21 194 lb 9.6 oz (88.3 kg)  02/14/21 239 lb (108.4 kg)     Health Maintenance Due  Topic Date  Due   PAP SMEAR-Modifier  12/15/2019   COVID-19 Vaccine (2 - Booster for Janssen series) 01/13/2020   Zoster Vaccines- Shingrix (2 of 2) 02/07/2020    There are no preventive care reminders to display for this patient.  Lab Results  Component Value Date   TSH 2.034 09/11/2021   Lab Results  Component Value Date   WBC 7.0 09/11/2021   HGB 14.3 09/11/2021   HCT 42.0 09/11/2021   MCV 98.4 09/11/2021   PLT 253  09/11/2021   Lab Results  Component Value Date   NA 141 09/11/2021   K 3.6 09/11/2021   CO2 23 09/11/2021   GLUCOSE 90 09/11/2021   BUN 12 09/11/2021   CREATININE 0.80 09/11/2021   BILITOT 0.5 09/11/2021   ALKPHOS 102 09/11/2021   AST 20 09/11/2021   ALT 15 09/11/2021   PROT 7.0 09/11/2021   ALBUMIN 3.6 09/11/2021   CALCIUM 8.8 (L) 09/11/2021   ANIONGAP 10 09/11/2021   EGFR 85 02/12/2021   GFR 76.70 12/14/2016   Lab Results  Component Value Date   CHOL 236 (H) 09/11/2021   Lab Results  Component Value Date   HDL 50 09/11/2021   Lab Results  Component Value Date   LDLCALC 169 (H) 09/11/2021   Lab Results  Component Value Date   TRIG 85 09/11/2021   Lab Results  Component Value Date   CHOLHDL 4.7 09/11/2021   Lab Results  Component Value Date   HGBA1C 5.2 09/11/2021      Assessment & Plan:   Problem List Items Addressed This Visit       Respiratory   Asthma     Other   Hyperlipidemia   Acute left-sided weakness   Other Visit Diagnoses     Hospital discharge follow-up    -  Primary   Relevant Orders   CBC w/Diff   Comp Met (CMET)   Hematuria, unspecified type       Relevant Orders   Urine Culture   POCT urinalysis dipstick (Completed)   Hypocalcemia       Relevant Orders   Comp Met (CMET)   Dizziness          Hospital discharge follow-up, Acute left-sided weakness: -Reviewed hospital notes, labs and imaging. -MR angio head wo contrast: IMPRESSION: 1. No acute intracranial pathology. 2. Patent intracranial vasculature. -Discussed with patient motion sickness/dizziness likely multi-factorial including vertigo related. Offered medication therapy (meclizine) and pt deferred, advised if needed can take OTC dramamine. -Pt was referred to neurology, no appointment scheduled yet. Advised can call next week to inquire referral status if has not been contacted. Recommend to continue Plavix 75 mg and Aspirin 81 mg.   Hyperlipidemia: -Patient was  started on Lipitor 80 mg. Discussed potential side effects and if overall tolerating medication then recommend to continue. -Will repeat lipid panel and hepatic function in 3 months.  Thoracic outlet syndrome: -Stable. No intervention at this time.  Hematuria: -UA at hospital revealed trace of Hgb, UA repeated today and positive for small leukocytes with trace of intact blood. Will send for urine culture to r/o UTI. Recommend repeating UA in 4 weeks to re-evaluate hematuria.   Hypocalcemia: -09/11/2021: Calcium 8.8, mildly low. Will repeat CMP.  Asthma: -Stable. -Continue current medication regimen. See med list. -Will continue to monitor.  No orders of the defined types were placed in this encounter.   Follow-up: Return in 3 months (on 12/17/2021) for CPE and FBW.    [name], PA-C

## 2021-09-19 NOTE — Patient Instructions (Signed)
Transient Ischemic Attack A transient ischemic attack (TIA) causes the same symptoms as a stroke, but the symptoms go away quickly. A TIA happens when blood flow to the brain is blocked. Having a TIA means you may be at risk for a stroke. A TIA is a medical emergency. What are the causes? A TIA is caused by a blocked artery in the head or neck. This means the brain does not get the blood supply it needs. A blockage can be caused by: Fatty buildup in an artery in the head or neck. A blood clot. A tear in an artery. Irritation and swelling (inflammation) of an artery. Sometimes the cause is not known. What increases the risk? Certain things may make you more likely to have a TIA. Some of these are things that you can change, such as: Being very overweight. Using products that have nicotine or tobacco. Taking birth control pills. Not being active. Drinking too much alcohol. Using drugs. Health conditions that may increase your risk include: High blood pressure. High cholesterol. Diabetes. Heart disease. A heartbeat that is not regular (atrial fibrillation). Sickle cell disease. Sleep problems (sleep apnea). Long-term diseases that cause irritation and swelling. Problems with blood clotting. Other risk factors include: Being over the age of 33. Being female. Having a family history of stroke. Having had blood clots, stroke, TIA, or heart attack in the past. Having a history of high blood pressure when pregnant (preeclampsia). Very bad headaches (migraines). What are the signs or symptoms? The symptoms of a TIA are like those of a stroke. They can include: Weakness or loss of feeling in your face, arm, or leg. This often happens on one side of your body. Trouble walking. Trouble moving your arms or legs. Trouble talking or understanding what people are saying. Problems with how you see. Feeling dizzy. Feeling confused. Loss of balance or coordination. Feeling like you may vomit  (nausea) or you vomit. Having a very bad headache. If you can, note what time you started to have symptoms. Tell your doctor. How is this treated? The goal of treatment is to lower the risk for a stroke. This may include: Changes to diet and lifestyle, such as getting regular exercise and stopping smoking. Taking medicines to: Thin the blood. Lower blood pressure. Lower cholesterol. Treating other health conditions, such as diabetes. If testing shows that an artery in your brain is narrow, your doctor may recommend a procedure to: Take the blockage out of your artery (carotid endarterectomy). Open or widen an artery in your neck (carotid angioplasty and stenting). Follow these instructions at home: Medicines Take over-the-counter and prescription medicines only as told by your doctor. If you were told to take aspirin or another medicine to thin your blood, take it exactly as told by your doctor. Taking too much of the medicine can cause bleeding. Taking too little of the medicine may not work to treat the problem. Eating and drinking  Eat 5 or more servings of fruits and vegetables each day. Follow instructions from your doctor about your diet. You may need to follow a certain diet to help lower your risk of a stroke. You may need to: Eat a diet that is low in fat and salt. Eat foods with a lot of fiber. Limit carbohydrates and sugar. If you drink alcohol: Limit how much you have to: 0-1 drink a day for women who are not pregnant. 0-2 drinks a day for men. Know how much alcohol is in a drink. In the  [place] one drink equals one 12 oz bottle of beer (349m), one 5 oz glass of wine (1464m, or one 1 oz glass of hard liquor (4418m General instructions Keep a healthy weight. Try to get at least 30 minutes of exercise on most days. Get treatment if you have sleep problems. Do not smoke or use any products that contain nicotine or tobacco. If you need help quitting, ask your doctor. Do  not use drugs. Keep all follow-up visits. Where to find more information American Stroke Association: www.stroke.org Get help right away if: You have chest pain. You have a heartbeat that is not regular. You have any signs of a stroke. "BE FAST" is an easy way to remember the main warning signs: B - Balance. Dizziness, sudden trouble walking, or loss of balance. E - Eyes. Trouble seeing or a change in how you see. F - Face. Sudden weakness or loss of feeling of the face. The face or eyelid may droop on one side. A - Arms. Weakness or loss of feeling in an arm. This happens all of a sudden and most often on one side of the body. S - Speech. Sudden trouble speaking, slurred speech, or trouble understanding what people say. T - Time. Time to call emergency services. Write down what time symptoms started. You have other signs of a stroke, such as: A sudden, very bad headache with no known cause. Feeling like you may vomit. Vomiting. A seizure. These symptoms may be an emergency. Get help right away. Call your local emergency services (911 in the [place] Do not wait to see if the symptoms will go away. Do not drive yourself to the hospital. Summary A transient ischemic attack (TIA) happens when an artery in the head or neck is blocked. This causes the same symptoms as a stroke. The symptoms go away quickly. A TIA is a medical emergency. Get help right away, even if your symptoms go away. Having a TIA means that you may be at risk for a stroke. Taking medicines and making diet and lifestyle changes can help to prevent a stroke. This information is not intended to replace advice given to you by your health care provider. Make sure you discuss any questions you have with your health care provider. Document Revised: 02/20/2020 Document Reviewed: 02/20/2020 Elsevier Patient Education  [address]

## 2021-09-20 LAB — CBC WITH DIFFERENTIAL/PLATELET
Basophils Absolute: 0 10*3/uL (ref 0.0–0.2)
Basos: 1 %
EOS (ABSOLUTE): 0.2 10*3/uL (ref 0.0–0.4)
Eos: 4 %
Hematocrit: 41.4 % (ref 34.0–46.6)
Hemoglobin: 14.1 g/dL (ref 11.1–15.9)
Immature Grans (Abs): 0 10*3/uL (ref 0.0–0.1)
Immature Granulocytes: 0 %
Lymphocytes Absolute: 1.5 10*3/uL (ref 0.7–3.1)
Lymphs: 25 %
MCH: 31.2 pg (ref 26.6–33.0)
MCHC: 34.1 g/dL (ref 31.5–35.7)
MCV: 92 fL (ref 79–97)
Monocytes Absolute: 0.4 10*3/uL (ref 0.1–0.9)
Monocytes: 6 %
Neutrophils Absolute: 3.8 10*3/uL (ref 1.4–7.0)
Neutrophils: 64 %
Platelets: 268 10*3/uL (ref 150–450)
RBC: 4.52 x10E6/uL (ref 3.77–5.28)
RDW: 12.7 % (ref 11.7–15.4)
WBC: 5.9 10*3/uL (ref 3.4–10.8)

## 2021-09-20 LAB — COMPREHENSIVE METABOLIC PANEL
ALT: 15 IU/L (ref 0–32)
AST: 18 IU/L (ref 0–40)
Albumin/Globulin Ratio: 1.5 (ref 1.2–2.2)
Albumin: 4.4 g/dL (ref 3.8–4.9)
Alkaline Phosphatase: 144 IU/L — ABNORMAL HIGH (ref 44–121)
BUN/Creatinine Ratio: 12 (ref 9–23)
BUN: 12 mg/dL (ref 6–24)
Bilirubin Total: 0.6 mg/dL (ref 0.0–1.2)
CO2: 25 mmol/L (ref 20–29)
Calcium: 9.8 mg/dL (ref 8.7–10.2)
Chloride: 102 mmol/L (ref 96–106)
Creatinine, Ser: 1.02 mg/dL — ABNORMAL HIGH (ref 0.57–1.00)
Globulin, Total: 2.9 g/dL (ref 1.5–4.5)
Glucose: 99 mg/dL (ref 70–99)
Potassium: 5.4 mmol/L — ABNORMAL HIGH (ref 3.5–5.2)
Sodium: 142 mmol/L (ref 134–144)
Total Protein: 7.3 g/dL (ref 6.0–8.5)
eGFR: 66 mL/min/{1.73_m2} (ref >59)

## 2021-09-22 ENCOUNTER — Encounter: Payer: Self-pay | Admitting: Physician Assistant

## 2021-09-23 LAB — URINE CULTURE

## 2021-09-26 ENCOUNTER — Other Ambulatory Visit: Payer: Managed Care, Other (non HMO)

## 2021-09-26 ENCOUNTER — Other Ambulatory Visit: Payer: Self-pay

## 2021-09-26 ENCOUNTER — Encounter: Payer: Self-pay | Admitting: Physician Assistant

## 2021-09-27 ENCOUNTER — Encounter: Payer: Self-pay | Admitting: Physician Assistant

## 2021-09-27 DIAGNOSIS — R748 Abnormal levels of other serum enzymes: Secondary | ICD-10-CM

## 2021-09-27 LAB — COMPREHENSIVE METABOLIC PANEL
ALT: 14 IU/L (ref 0–32)
AST: 17 IU/L (ref 0–40)
Albumin/Globulin Ratio: 1.6 (ref 1.2–2.2)
Albumin: 4.2 g/dL (ref 3.8–4.9)
Alkaline Phosphatase: 136 IU/L — ABNORMAL HIGH (ref 44–121)
BUN/Creatinine Ratio: 18 (ref 9–23)
BUN: 14 mg/dL (ref 6–24)
Bilirubin Total: 0.6 mg/dL (ref 0.0–1.2)
CO2: 26 mmol/L (ref 20–29)
Calcium: 9.2 mg/dL (ref 8.7–10.2)
Chloride: 102 mmol/L (ref 96–106)
Creatinine, Ser: 0.8 mg/dL (ref 0.57–1.00)
Globulin, Total: 2.6 g/dL (ref 1.5–4.5)
Glucose: 89 mg/dL (ref 70–99)
Potassium: 4.6 mmol/L (ref 3.5–5.2)
Sodium: 140 mmol/L (ref 134–144)
Total Protein: 6.8 g/dL (ref 6.0–8.5)
eGFR: 88 mL/min/{1.73_m2} (ref >59)

## 2021-09-29 ENCOUNTER — Telehealth: Payer: Self-pay | Admitting: Physician Assistant

## 2021-09-29 ENCOUNTER — Ambulatory Visit: Payer: Managed Care, Other (non HMO) | Admitting: Internal Medicine

## 2021-09-29 NOTE — Telephone Encounter (Signed)
Apologies. I also thought I would update to let you know I left message for Dr. Bonner Puna (prescribing dr of plavix and aspirin from hospital) regarding the dizziness possibly from medication as per [facility] instructions. I mentioned heart flutters which began this week not hugely frequent but a couple to 3x daily. Just wanted to keep you in the loop of communications. I hope to hear back from my message but since I am not a patient of that practice Im not sure What protocol would be. Thanks Herb Grays!    This came in a Mychart message from patient, which I have responded to. I have given patient the phone number to [facility] to contact to schedule herself an appointment.   I have also advised patient if Dr. Bonner Puna is a hospitalitis, reaching out to him is not the correct line of contact and that I would send you a message letting you know her reactions to the meds. AS, CMA

## 2021-10-08 ENCOUNTER — Other Ambulatory Visit: Payer: Self-pay | Admitting: Physician Assistant

## 2021-10-08 DIAGNOSIS — R202 Paresthesia of skin: Secondary | ICD-10-CM

## 2021-10-08 DIAGNOSIS — G6289 Other specified polyneuropathies: Secondary | ICD-10-CM

## 2021-10-08 DIAGNOSIS — R42 Dizziness and giddiness: Secondary | ICD-10-CM

## 2021-10-08 DIAGNOSIS — G629 Polyneuropathy, unspecified: Secondary | ICD-10-CM

## 2021-10-08 DIAGNOSIS — R29898 Other symptoms and signs involving the musculoskeletal system: Secondary | ICD-10-CM

## 2021-10-09 ENCOUNTER — Other Ambulatory Visit (INDEPENDENT_AMBULATORY_CARE_PROVIDER_SITE_OTHER): Payer: Managed Care, Other (non HMO)

## 2021-10-09 ENCOUNTER — Encounter: Payer: Self-pay | Admitting: Physician Assistant

## 2021-10-09 ENCOUNTER — Ambulatory Visit (INDEPENDENT_AMBULATORY_CARE_PROVIDER_SITE_OTHER): Payer: Managed Care, Other (non HMO) | Admitting: Physician Assistant

## 2021-10-09 VITALS — BP 118/70 | HR 87 | Ht 63.0 in

## 2021-10-09 DIAGNOSIS — R1013 Epigastric pain: Secondary | ICD-10-CM

## 2021-10-09 DIAGNOSIS — R748 Abnormal levels of other serum enzymes: Secondary | ICD-10-CM | POA: Diagnosis not present

## 2021-10-09 DIAGNOSIS — Z8673 Personal history of transient ischemic attack (TIA), and cerebral infarction without residual deficits: Secondary | ICD-10-CM | POA: Diagnosis not present

## 2021-10-09 LAB — PROTIME-INR
INR: 1.1 ratio — ABNORMAL HIGH (ref 0.8–1.0)
Prothrombin Time: 11.7 s (ref 9.6–13.1)

## 2021-10-09 LAB — COMPREHENSIVE METABOLIC PANEL
ALT: 15 U/L (ref 0–35)
AST: 18 U/L (ref 0–37)
Albumin: 3.9 g/dL (ref 3.5–5.2)
Alkaline Phosphatase: 128 U/L — ABNORMAL HIGH (ref 39–117)
BUN: 11 mg/dL (ref 6–23)
CO2: 29 mEq/L (ref 19–32)
Calcium: 9.1 mg/dL (ref 8.4–10.5)
Chloride: 105 mEq/L (ref 96–112)
Creatinine, Ser: 0.87 mg/dL (ref 0.40–1.20)
GFR: 76.03 mL/min (ref >60.00)
Glucose, Bld: 75 mg/dL (ref 70–99)
Potassium: 3.9 mEq/L (ref 3.5–5.1)
Sodium: 140 mEq/L (ref 135–145)
Total Bilirubin: 0.7 mg/dL (ref 0.2–1.2)
Total Protein: 7.1 g/dL (ref 6.0–8.3)

## 2021-10-09 LAB — CBC WITH DIFFERENTIAL/PLATELET
Basophils Absolute: 0 10*3/uL (ref 0.0–0.1)
Basophils Relative: 0.8 % (ref 0.0–3.0)
Eosinophils Absolute: 0.3 10*3/uL (ref 0.0–0.7)
Eosinophils Relative: 5.3 % — ABNORMAL HIGH (ref 0.0–5.0)
HCT: 39.6 % (ref 36.0–46.0)
Hemoglobin: 13 g/dL (ref 12.0–15.0)
Lymphocytes Relative: 24.2 % (ref 12.0–46.0)
Lymphs Abs: 1.4 10*3/uL (ref 0.7–4.0)
MCHC: 32.9 g/dL (ref 30.0–36.0)
MCV: 94.4 fl (ref 78.0–100.0)
Monocytes Absolute: 0.3 10*3/uL (ref 0.1–1.0)
Monocytes Relative: 5.5 % (ref 3.0–12.0)
Neutro Abs: 3.6 10*3/uL (ref 1.4–7.7)
Neutrophils Relative %: 64.2 % (ref 43.0–77.0)
Platelets: 207 10*3/uL (ref 150.0–400.0)
RBC: 4.19 Mil/uL (ref 3.87–5.11)
RDW: 14 % (ref 11.5–15.5)
WBC: 5.7 10*3/uL (ref 4.0–10.5)

## 2021-10-09 LAB — IBC + FERRITIN
Ferritin: 15.8 ng/mL (ref 10.0–291.0)
Iron: 65 ug/dL (ref 42–145)
Saturation Ratios: 19.8 % — ABNORMAL LOW (ref 20.0–50.0)
TIBC: 327.6 ug/dL (ref 250.0–450.0)
Transferrin: 234 mg/dL (ref 212.0–360.0)

## 2021-10-09 NOTE — Progress Notes (Signed)
Chief Complaint: Elevated alkaline phosphatase  HPI:    Denise Gamble is a 56 year old female, known to Dr. Henrene Pastor, with a past medical history as listed below including reflux, who was referred to me by [name], PA-C for a complaint of elevated alkaline phosphatase.    10/24/2018 abdominal ultrasound with slightly increased liver echogenicity suggesting mild fatty change.    07/22/2020 colonoscopy with a 2 mm polyp in the ascending colon and diverticulosis in the sigmoid colon.  Pathology showed tubular adenoma repeat recommended in 5 years.    09/19/2021 patient seen by PCP for follow-up after being in the hospital for suspicion of stroke.  She was started on dual antiplatelet therapy for 3 weeks, then aspirin and started on statin and told to follow-up with neurology in 6 to 8 weeks for TIA.    09/26/2021 alk phos elevated 136 (09/19/2021 144, 09/11/2021 normal at 102, 02/12/2021 123).    Today, the patient tells me that she has been told over the past couple of years that her alk phos has been elevated, a couple of times it has gone up and down and even back to normal at one-point, but more consistently it has been elevated.  Tells me she is trying to figure out what is going on as she has had a lot of strange things happening.  Tells me that in 2020 she started with burning of the skin that outside of her legs and no one has really been able to figure that out.  She was told that if it was due to nerve damage from her hips that it should get better within 3 years.  She is hoping this better by October.  Also describes some severe epigastric pain at times for which she had the ultrasound above at one point.  Explains that sometimes it will even happen in her sleep where she gets a severe pain like a 10 /10 that can last 1 to 2 minutes.  She will chew a Tums and is not sure if this helps or not but it can occur 4-6 times a year.  She has started sleeping with the head of her bed elevated.    Denies fever,  chills, weight loss, blood in her stool, change in bowel habits, heartburn, reflux, history of IV drug use or family history of liver disease.  Past Medical History:  Diagnosis Date   Allergy    seasonal allergies   Asthma    adult onset (1998)   GERD (gastroesophageal reflux disease)    for PRN use   History of MRSA infection    Miscarriage 2013   multiple    Paresthesia of lower extremity    Peripheral neuropathy 2019   left foot   Plantar fasciitis of left foot    Thoracic outlet syndrome    Urinary incontinence     Past Surgical History:  Procedure Laterality Date   DILATION AND CURETTAGE OF UTERUS  2011   LYMPHADENECTOMY Left 1979   WISDOM TOOTH EXTRACTION      Current Outpatient Medications  Medication Sig Dispense Refill   ADVAIR HFA 45-21 MCG/ACT inhaler USE 2 INHALATIONS TWICE A DAY (Patient taking differently: 2 puffs 2 (two) times daily as needed (shortness of breath).) 12 g 11   albuterol (VENTOLIN HFA) 108 (90 Base) MCG/ACT inhaler USE 2 INHALATIONS EVERY 4 HOURS AS NEEDED FOR WHEEZING OR SHORTNESS OF BREATH (Patient taking differently: 2 puffs every 4 (four) hours as needed for wheezing or shortness of  breath.) 17 g 1   aspirin EC 81 MG EC tablet Take 1 tablet (81 mg total) by mouth daily. 30 tablet 1   atorvastatin (LIPITOR) 80 MG tablet Take 1 tablet (80 mg total) by mouth daily. 30 tablet 1   cetirizine (ZYRTEC) 10 MG tablet Take 10 mg by mouth daily.     cholecalciferol (VITAMIN D3) 25 MCG (1000 UNIT) tablet Take 1,000 Units by mouth daily.     clotrimazole-betamethasone (LOTRISONE) cream SMARTSIG:In Ear(s)     montelukast (SINGULAIR) 10 MG tablet TAKE 1 TABLET AT BEDTIME (Patient taking differently: Take 10 mg by mouth at bedtime.) 90 tablet 1   Omega-3 Fatty Acids (EQL OMEGA 3 FISH OIL) 1000 MG CPDR Take 1,000 mg by mouth daily.     vitamin B-12 (CYANOCOBALAMIN) 1000 MCG tablet Take 1,000 mcg by mouth daily.     No current facility-administered medications  for this visit.    Allergies as of 10/09/2021 - Review Complete 09/19/2021  Allergen Reaction Noted   Pollen extract Hives 05/28/2014   Latex Hives 02/25/2017    Family History  Problem Relation Age of Onset   Heart disease Mother 31       epstein anolmay    Dementia Mother    Colon polyps Mother    Diverticulitis Mother    Colon cancer Father 47   Skin cancer Father        ? thinks melanoma?    Leukemia Father        AAL   Hyperlipidemia Father    Hypertension Father    Colon polyps Father 48   Heart disease Brother    Alcohol abuse Brother    Diabetes Brother    Hypertension Brother    Prostate cancer Brother    Coronary artery disease Brother    Colon polyps Brother    Hyperlipidemia Sister    Hypertension Sister    Stroke Sister 35   Brain cancer Sister 68       mets from lung   Lung cancer Sister 71       mets to brain-then stroke   Heart attack Maternal Grandfather    Hypertension Sister    Hypertension Sister    Colon polyps Brother 62   Alzheimer's disease Maternal Aunt    Breast cancer Neg Hx    Esophageal cancer Neg Hx    Stomach cancer Neg Hx    Rectal cancer Neg Hx     Social History   Socioeconomic History   Marital status: Married    Spouse name: Eddie Dibbles   Number of children: 0   Years of education: Not on file   Highest education level: Master's degree (e.g., MA, MS, MEng, MEd, MSW, MBA)  Occupational History   Not on file  Tobacco Use   Smoking status: Never   Smokeless tobacco: Never  Vaping Use   Vaping Use: Never used  Substance and Sexual Activity   Alcohol use: No    Alcohol/week: 0.0 standard drinks    Comment: quit 2010   Drug use: No   Sexual activity: Yes    Comment: perimenopausal  Other Topics Concern   Not on file  Social History Narrative   Works for [employer] to [employer] health   Married   No caffeine            Social Determinants of Radio broadcast assistant Strain: Not on file  Food  Insecurity: Not on Pensions consultant  Needs: Not on file  Physical Activity: Not on file  Stress: Not on file  Social Connections: Not on file  Intimate Partner Violence: Not on file    Review of Systems:    Constitutional: No weight loss, fever or chills Cardiovascular: No chest pain Respiratory: No SOB Gastrointestinal: See HPI and otherwise negative   Physical Exam:  Vital signs: BP 118/70    Pulse 87    Ht 5' 3"  (1.6 m)    LMP 03/29/2019    BMI 41.27 kg/m    Constitutional:   Pleasant obese Caucasian female appears to be in NAD, Well developed, Well nourished, alert and cooperative Respiratory: Respirations even and unlabored. Lungs clear to auscultation bilaterally.   No wheezes, crackles, or rhonchi.  Cardiovascular: Normal S1, S2. No MRG. Regular rate and rhythm. No peripheral edema, cyanosis or pallor.  Gastrointestinal:  Soft, nondistended, nontender. No rebound or guarding. Normal bowel sounds. No appreciable masses or hepatomegaly. Rectal:  Not performed.  Psychiatric: Demonstrates good judgement and reason without abnormal affect or behaviors.  RELEVANT LABS AND IMAGING: CBC    Component Value Date/Time   WBC 5.9 09/19/2021 1041   WBC 7.0 09/11/2021 1200   RBC 4.52 09/19/2021 1041   RBC 4.27 09/11/2021 1200   HGB 14.1 09/19/2021 1041   HCT 41.4 09/19/2021 1041   PLT 268 09/19/2021 1041   MCV 92 09/19/2021 1041   MCH 31.2 09/19/2021 1041   MCH 31.1 09/11/2021 1200   MCHC 34.1 09/19/2021 1041   MCHC 31.7 09/11/2021 1200   RDW 12.7 09/19/2021 1041   LYMPHSABS 1.5 09/19/2021 1041   MONOABS 0.4 09/11/2021 1200   EOSABS 0.2 09/19/2021 1041   BASOSABS 0.0 09/19/2021 1041    CMP     Component Value Date/Time   NA 140 09/26/2021 1007   K 4.6 09/26/2021 1007   CL 102 09/26/2021 1007   CO2 26 09/26/2021 1007   GLUCOSE 89 09/26/2021 1007   GLUCOSE 90 09/11/2021 1215   BUN 14 09/26/2021 1007   CREATININE 0.80 09/26/2021 1007   CALCIUM 9.2 09/26/2021 1007    PROT 6.8 09/26/2021 1007   ALBUMIN 4.2 09/26/2021 1007   AST 17 09/26/2021 1007   ALT 14 09/26/2021 1007   ALKPHOS 136 (H) 09/26/2021 1007   BILITOT 0.6 09/26/2021 1007   GFRNONAA >60 09/11/2021 1200   GFRAA 84 10/27/2019 0948    Assessment: 1.  Elevated alk phos: Elevated over the past couple of years, uncertain significance, fatty liver on last ultrasound 3 years ago; consider autoimmune versus fatty liver versus other 2.  Epigastric pain: 4-6 times a year occurs for 1 to 2 minutes severe pain; consider hiatal hernia versus gastritis versus other 3.  Recent TIA: Initiated on statins and on blood thinner, following with neurology  Plan: 1.  Ordered further labs today to include a repeat CMP, CBC, PT/INR, IgA, tTG, iron studies, alk phos isoenzymes, ASMA, ANA, AMA, alpha-1 antitrypsin and hepatitis labs 2.  Discussed with patient this is a very mild elevation. 3.  Repeat right upper quadrant ultrasound 4.  Discussed that origination of epigastric pain occasionally could be a hiatal hernia, could consider EGD in the future if patient would like, but would not pursue at this very moment given that she recently had a TIA 5.  Patient to follow in clinic per recommendations after labs and imaging above.  Ellouise Newer, PA-C [facility] 10/09/2021, 10:07 AM  Cc: [name], PA-C

## 2021-10-09 NOTE — Patient Instructions (Signed)
Your provider has requested that you go to the basement level for lab work before leaving today. Press "B" on the elevator. The lab is located at the first door on the left as you exit the elevator. ? ?You have been scheduled for an abdominal ultrasound at [facility] (1st floor of hospital) on Thursday 10/16/21 at 9 am. Please arrive 15 minutes prior to your appointment for registration. Make certain not to have anything to eat or drink 6 hours prior to your appointment. Should you need to reschedule your appointment, please contact radiology at [phone]. This test typically takes about 30 minutes to perform. ? ?If you are age 71 or younger, your body mass index should be between 19-25. Your Body mass index is 41.27 kg/m?Marland Kitchen If this is out of the aformentioned range listed, please consider follow up with your Primary Care Provider.  ? ?________________________________________________________ ? ?The [place] [facility] providers would like to encourage you to use [facility] to communicate with providers for non-urgent requests or questions.  Due to long hold times on the telephone, sending your provider a message by [facility] may be a faster and more efficient way to get a response.  Please allow 48 business hours for a response.  Please remember that this is for non-urgent requests.  ?_______________________________________________________ ? ?

## 2021-10-09 NOTE — Progress Notes (Signed)
Assessment and plan noted ?

## 2021-10-12 LAB — ALKALINE PHOSPHATASE, ISOENZYMES
Alkaline Phosphatase: 145 IU/L — ABNORMAL HIGH (ref 44–121)
BONE FRACTION: 23 % (ref 14–68)
INTESTINAL FRAC.: 1 % (ref 0–18)
LIVER FRACTION: 76 % (ref 18–85)

## 2021-10-12 NOTE — Progress Notes (Signed)
Denise Gamble Note - Initial Visit   Date: 10/13/21  Denise Gamble MRN: [id number] DOB: December 08, 1965   Dear  Ms. Denise Reid, PA-C:  Thank you for your kind referral of Denise Gamble for consultation of TIA. Although her history is well known to you, please allow [place] to reiterate it for the purpose of our medical record. The patient was here alone.     History of Present Illness:   Denise Gamble is a delightful  56 y.o. R-handed female with a history of asthma, bilateral upper leg neuropathy left greater than right, hyperlipidemia, history of thoracic outlet syndrome, presenting for evaluation of recent TIA.  In review, the patient presented to the emergency department on 09/11/2021 with acute onset of dizziness with loss of equilibrium while walking her dogs "world turned sideways".  She noted numbness in the L cheek , L upper extremity, as well as tingling of fingers. L arm and leg were heavy. Dizziness resolved in the ambulance. " I was loopy but not that extreme" .  At the room, symptoms resolved  completely within an hour.   It was suspected that the symptoms were likely multifactorial, including vertigo related.  She was offered meclizine, but the patient deferred. Work-up included 2D echo, MRI, MRA of the head, carotid ultrasound, all within normal limits.  Only remarkable finding was right V4 segment is diminutive, especially after the PICA origin, likely developmental variant, otherwise patent.  No aneurysm was noted.  It was felt that her TIA was due to posterior circulation likely from secondary small vessel disease She was placed on DAPT for 3 weeks, then aspirin alone.  She was started on statin as recommended by hospital neurology.    Denies headaches, dysarthria or dysphagia. No confusion or seizures. Denies any chest pain, or shortness of breath. Denies any fever or chills, or night sweats. No tobacco. No new meds or hormonal  supplements.  She denies any history of prior stroke or TIA. She takes a baby ASA a day, with no other antiplatelets or anticoagulants- She was on Plavix for 3 weeks. She denies any recent long distance trips or recent surgeries. No sick contacts. No new stressors present in personal life but does admit that her current job is stressful. Patient is compliant with her medications. Drinks plenty water .Patient is active, walks the dog daily. She is not a diabetic.  Family history remarkable for stroke in her sister ( but had metastatic lung_> brain cancer) . Denies alcohol or tobacco or recreational drugs.  MRA head 09/11/21 No acute intracranial pathology.2. Patent intracranial vasculature.  MRI brain 09/11/21 normal.   Echo 09/11/21 EF 60-65%, normal   EKG SR without abnormalities     Out-side paper records, electronic medical record, and images have been reviewed where available and summarized as:  Lab Results  Component Value Date   HGBA1C 5.2 09/11/2021   Lab Results  Component Value Date   [id number] 829 06/11/2020   Lab Results  Component Value Date   TSH 2.034 09/11/2021   Lab Results  Component Value Date   ESRSEDRATE 18 06/11/2020    Past Medical History:  Diagnosis Date   Allergy    seasonal allergies   Asthma    adult onset (1998)   GERD (gastroesophageal reflux disease)    for PRN use   History of MRSA infection    Miscarriage 2013   multiple    Paresthesia of lower extremity    Peripheral  neuropathy 2019   left foot   Plantar fasciitis of left foot    Thoracic outlet syndrome    TIA (transient ischemic attack) 09/11/2021   Urinary incontinence     Past Surgical History:  Procedure Laterality Date   DILATION AND CURETTAGE OF UTERUS  2011   LYMPHADENECTOMY Left 1979   WISDOM TOOTH EXTRACTION       Medications:  Outpatient Encounter Medications as of 10/13/2021  Medication Sig Note   ADVAIR HFA 45-21 MCG/ACT inhaler USE 2 INHALATIONS TWICE A DAY (Patient  taking differently: 2 puffs 2 (two) times daily as needed (shortness of breath).)    albuterol (VENTOLIN HFA) 108 (90 Base) MCG/ACT inhaler USE 2 INHALATIONS EVERY 4 HOURS AS NEEDED FOR WHEEZING OR SHORTNESS OF BREATH (Patient taking differently: 2 puffs every 4 (four) hours as needed for wheezing or shortness of breath.)    aspirin EC 81 MG EC tablet Take 1 tablet (81 mg total) by mouth daily.    atorvastatin (LIPITOR) 80 MG tablet Take 1 tablet (80 mg total) by mouth daily.    cetirizine (ZYRTEC) 10 MG tablet Take 10 mg by mouth daily. 08/01/2015: Received from: [facility]   cholecalciferol (VITAMIN D3) 25 MCG (1000 UNIT) tablet Take 1,000 Units by mouth daily.    montelukast (SINGULAIR) 10 MG tablet TAKE 1 TABLET AT BEDTIME (Patient taking differently: Take 10 mg by mouth at bedtime.)    Omega-3 Fatty Acids (EQL OMEGA 3 FISH OIL) 1000 MG CPDR Take 1,000 mg by mouth daily.    vitamin B-12 (CYANOCOBALAMIN) 1000 MCG tablet Take 1,000 mcg by mouth daily.    No facility-administered encounter medications on file as of 10/13/2021.    Allergies:  Allergies  Allergen Reactions   Pollen Extract Hives    All tree pollens and dust mites   Latex Hives    Family History: Family History  Problem Relation Age of Onset   Heart disease Mother 20       epstein anolmay    Dementia Mother    Colon polyps Mother    Diverticulitis Mother    Colon cancer Father 65   Skin cancer Father        ? thinks melanoma?    Leukemia Father        AAL   Hyperlipidemia Father    Hypertension Father    Colon polyps Father 8   Heart disease Brother    Alcohol abuse Brother    Diabetes Brother    Hypertension Brother    Prostate cancer Brother    Coronary artery disease Brother    Colon polyps Brother    Hyperlipidemia Sister    Hypertension Sister    Stroke Sister 2   Brain cancer Sister 24       mets from lung   Lung cancer Sister 37       mets to brain-then stroke   Heart attack Maternal  Grandfather    Hypertension Sister    Hypertension Sister    Colon polyps Brother 57   Alzheimer's disease Maternal Aunt    Breast cancer Neg Hx    Esophageal cancer Neg Hx    Stomach cancer Neg Hx    Rectal cancer Neg Hx     Social History: Social History   Tobacco Use   Smoking status: Never   Smokeless tobacco: Never  Vaping Use   Vaping Use: Never used  Substance Use Topics   Alcohol use: No    Alcohol/week:  0.0 standard drinks    Comment: quit 2010   Drug use: No   Social History   Social History Narrative   Works for [employer] to [employer]   Married   No caffeine             Vital Signs:  Resp 18    Ht 5' 3"  (1.6 m)    Wt 237 lb (107.5 kg)    LMP 03/29/2019    SpO2 98%    BMI 41.98 kg/m     General Medical Exam:   General:  Well appearing, comfortable.   Eyes/ENT: see cranial nerve examination.   Neck:   No carotid bruits. Respiratory:  Clear to auscultation, good air entry bilaterally.   Cardiac:  Regular rate and rhythm, no murmur.   Extremities:  No deformities, edema, or skin discoloration.  Skin:  No rashes or lesions.  Neurological Exam: MENTAL STATUS including orientation to time, place, person, recent and remote memory, attention span and concentration, language, and fund of knowledge is normal.  Speech is not dysarthric.  CRANIAL NERVES: II:  No visual field defects.  Unremarkable fundi.   III-IV-VI: Pupils equal round and reactive to light.  Normal conjugate, extra-ocular eye movements in all directions of gaze.  No nystagmus.  No ptosis V:  Normal facial sensation.    VII:  Normal facial symmetry and movements.   VIII:  Normal hearing and vestibular function.   IX-X:  Normal palatal movement.   XI:  Normal shoulder shrug and head rotation.   XII:  Normal tongue strength and range of motion, no deviation or fasciculation.  MOTOR:  No atrophy, fasciculations or abnormal movements.  No pronator drift.    SENSORY:   Normal and symmetric perception of light touch, pinprick, vibration, and proprioception.  Romberg's sign absent.   COORDINATION/GAIT: Normal finger-to- nose-finger and heel-to-shin.  Intact rapid alternating movements bilaterally.  Able to rise from a chair without using arms.  Gait narrow based and stable. Tandem and stressed gait intact.    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 History of thoracic outlet syndrome  MRI /MRA head  No acute intracranial pathology, 2D Echo EF 60-65%, LV normal function. LDL 169. HgbA1c 5.2. She was on Plavix and ASA x 3 weeks , currently on baby ASA daily Continue statin and  BB   Follow up as needed   Dizziness : Etiology likely multifactorial, prior history of vertigo  MRI MRA, 2 D echo, EKG  without evidence of acute stroke or other significant abnormality.  Meclizine when necessary dizziness Monitor BP for orthostatic hypotension .  Referral to ENT, may need vestibular therapy       Total time spent: 70 mins    Thank you for allowing me to participate in patient's care.  If I can answer any additional questions, I would be pleased to do so.    Sincerely,   Sharene Butters, PA-C

## 2021-10-13 ENCOUNTER — Encounter: Payer: Self-pay | Admitting: Physician Assistant

## 2021-10-13 ENCOUNTER — Ambulatory Visit: Payer: Managed Care, Other (non HMO) | Admitting: Neurology

## 2021-10-13 ENCOUNTER — Other Ambulatory Visit: Payer: Self-pay

## 2021-10-13 ENCOUNTER — Ambulatory Visit (INDEPENDENT_AMBULATORY_CARE_PROVIDER_SITE_OTHER): Payer: Managed Care, Other (non HMO) | Admitting: Physician Assistant

## 2021-10-13 VITALS — Resp 18 | Ht 63.0 in | Wt 237.0 lb

## 2021-10-13 DIAGNOSIS — G459 Transient cerebral ischemic attack, unspecified: Secondary | ICD-10-CM | POA: Diagnosis not present

## 2021-10-13 DIAGNOSIS — R42 Dizziness and giddiness: Secondary | ICD-10-CM

## 2021-10-13 LAB — HEPATITIS B CORE ANTIBODY, TOTAL: Hep B Core Total Ab: NONREACTIVE

## 2021-10-13 LAB — HEPATITIS B SURFACE ANTIBODY,QUALITATIVE: Hep B S Ab: NONREACTIVE

## 2021-10-13 LAB — ALPHA-1-ANTITRYPSIN: A-1 Antitrypsin, Ser: 134 mg/dL (ref 83–199)

## 2021-10-13 LAB — TISSUE TRANSGLUTAMINASE ABS,IGG,IGA
(tTG) Ab, IgA: 22.8 U/mL — ABNORMAL HIGH
(tTG) Ab, IgG: <1 U/mL

## 2021-10-13 LAB — IGA: Immunoglobulin A: 351 mg/dL — ABNORMAL HIGH (ref 47–310)

## 2021-10-13 LAB — HEPATITIS A ANTIBODY, TOTAL: Hepatitis A AB,Total: NONREACTIVE

## 2021-10-13 LAB — MITOCHONDRIAL ANTIBODIES: Mitochondrial M2 Ab, IgG: <=20 U (ref <=20.0)

## 2021-10-13 LAB — ANA: Anti Nuclear Antibody (ANA): NEGATIVE

## 2021-10-13 LAB — HEPATITIS C ANTIBODY
Hepatitis C Ab: NONREACTIVE
SIGNAL TO CUT-OFF: 0.03 (ref <1.00)

## 2021-10-13 LAB — ANTI-SMOOTH MUSCLE ANTIBODY, IGG: Actin (Smooth Muscle) Antibody (IGG): <20 U (ref <20)

## 2021-10-13 LAB — HEPATITIS B SURFACE ANTIGEN: Hepatitis B Surface Ag: NONREACTIVE

## 2021-10-13 NOTE — Patient Instructions (Addendum)
Follow up with primary physician controlling the cardiovascular risk factors  ?Control the stress levels  ?Continue the aspirin 81 mg  ?Ent referral for intermittent vertigo  ([facility])  ?Control the thyroid and iron  ?Follow up as needed  ? ? ?  ? ? ?Mediterranean Diet ?A Mediterranean diet refers to food and lifestyle choices that are based on the traditions of countries located on the [employer]. This way of eating has been shown to help prevent certain conditions and improve outcomes for people who have chronic diseases, like kidney disease and heart disease. ?What are tips for following this plan? ?Lifestyle  ?Cook and eat meals together with your family, when possible. ?Drink enough fluid to keep your urine clear or pale yellow. ?Be physically active every day. This includes: ?Aerobic exercise like running or swimming. ?Leisure activities like gardening, walking, or housework. ?Get 7-8 hours of sleep each night. ?If recommended by your health care provider, drink red wine in moderation. This means 1 glass a day for nonpregnant women and 2 glasses a day for men. A glass of wine equals 5 oz (150 mL). ?Reading food labels  ?Check the serving size of packaged foods. For foods such as rice and pasta, the serving size refers to the amount of cooked product, not dry. ?Check the total fat in packaged foods. Avoid foods that have saturated fat or trans fats. ?Check the ingredients list for added sugars, such as corn syrup. ?Shopping  ?At the grocery store, buy most of your food from the areas near the walls of the store. This includes: ?Fresh fruits and vegetables (produce). ?Grains, beans, nuts, and seeds. Some of these may be available in unpackaged forms or large amounts (in bulk). ?Fresh seafood. ?Poultry and eggs. ?Low-fat dairy products. ?Buy whole ingredients instead of prepackaged foods. ?Buy fresh fruits and vegetables in-season from local farmers markets. ?Buy frozen fruits and vegetables in  resealable bags. ?If you do not have access to quality fresh seafood, buy precooked frozen shrimp or canned fish, such as tuna, salmon, or sardines. ?Buy small amounts of raw or cooked vegetables, salads, or olives from the deli or salad bar at your store. ?Stock your pantry so you always have certain foods on hand, such as olive oil, canned tuna, canned tomatoes, rice, pasta, and beans. ?Cooking  ?Cook foods with extra-virgin olive oil instead of using butter or other vegetable oils. ?Have meat as a side dish, and have vegetables or grains as your main dish. This means having meat in small portions or adding small amounts of meat to foods like pasta or stew. ?Use beans or vegetables instead of meat in common dishes like chili or lasagna. ?Experiment with different cooking methods. Try roasting or broiling vegetables instead of steaming or saut?eing them. ?Add frozen vegetables to soups, stews, pasta, or rice. ?Add nuts or seeds for added healthy fat at each meal. You can add these to yogurt, salads, or vegetable dishes. ?Marinate fish or vegetables using olive oil, lemon juice, garlic, and fresh herbs. ?Meal planning  ?Plan to eat 1 vegetarian meal one day each week. Try to work up to 2 vegetarian meals, if possible. ?Eat seafood 2 or more times a week. ?Have healthy snacks readily available, such as: ?Vegetable sticks with hummus. ?[place] yogurt. ?Fruit and nut trail mix. ?Eat balanced meals throughout the week. This includes: ?Fruit: 2-3 servings a day ?Vegetables: 4-5 servings a day ?Low-fat dairy: 2 servings a day ?Fish, poultry, or lean meat: 1 serving a  day ?Beans and legumes: 2 or more servings a week ?Nuts and seeds: 1-2 servings a day ?Whole grains: 6-8 servings a day ?Extra-virgin olive oil: 3-4 servings a day ?Limit red meat and sweets to only a few servings a month ?What are my food choices? ?Mediterranean diet ?Recommended ?Grains: Whole-grain pasta. Brown rice. Bulgar wheat. Polenta. Couscous.  Whole-wheat bread. [place]. ?Vegetables: Artichokes. Beets. Broccoli. Cabbage. Carrots. Eggplant. Green beans. Chard. Kale. Spinach. Onions. Leeks. Peas. Squash. Tomatoes. Peppers. Radishes. ?Fruits: Apples. Apricots. Avocado. Berries. Bananas. Cherries. Dates. Figs. Grapes. Lemons. Melon. Oranges. Peaches. Plums. Pomegranate. ?Meats and other protein foods: Beans. Almonds. Sunflower seeds. Pine nuts. Peanuts. [place]in[place]. Salmon. Scallops. Shrimp. [place]. Tilapia. Clams. Oysters. Eggs. ?Dairy: Low-fat milk. Cheese. Greek yogurt. ?Beverages: Water. Red wine. Herbal tea. ?Fats and oils: Extra virgin olive oil. Avocado oil. Grape seed oil. ?Sweets and desserts: [place] yogurt with honey. Baked apples. Poached pears. Trail mix. ?Seasoning and other foods: Basil. Cilantro. Coriander. Cumin. Mint. Parsley. Sage. Rosemary. Tarragon. Garlic. Oregano. Thyme. Pepper. Balsalmic vinegar. Tahini. Hummus. Tomato sauce. Olives. Mushrooms. ?Limit these ?Grains: Prepackaged pasta or rice dishes. Prepackaged cereal with added sugar. ?Vegetables: Deep fried potatoes (french fries). ?Fruits: Fruit canned in syrup. ?Meats and other protein foods: Beef. Pork. Lamb. Poultry with skin. Hot dogs. Berniece Salines. ?Dairy: Ice cream. Sour cream. Whole milk. ?Beverages: Juice. Sugar-sweetened soft drinks. Beer. Liquor and spirits. ?Fats and oils: Butter. Canola oil. Vegetable oil. Beef fat (tallow). Lard. ?Sweets and desserts: Cookies. Cakes. Pies. Candy. ?Seasoning and other foods: Mayonnaise. Premade sauces and marinades. ?The items listed may not be a complete list. Talk with your dietitian about what dietary choices are right for you. ?Summary ?The Mediterranean diet includes both food and lifestyle choices. ?Eat a variety of fresh fruits and vegetables, beans, nuts, seeds, and whole grains. ?Limit the amount of red meat and sweets that you eat. ?Talk with your health care provider about whether it is safe for you to drink red wine in moderation. This  means 1 glass a day for nonpregnant women and 2 glasses a day for men. A glass of wine equals 5 oz (150 mL). ?This information is not intended to replace advice given to you by your health care provider. Make sure you discuss any questions you have with your health care provider. ?Document Released: 03/19/2016 Document Revised: 04/21/2016 Document Reviewed: 03/19/2016 ?Elsevier Interactive Patient Education ? 2017 [place].  ?   ?

## 2021-10-15 ENCOUNTER — Telehealth: Payer: Self-pay | Admitting: Physician Assistant

## 2021-10-15 ENCOUNTER — Other Ambulatory Visit: Payer: Self-pay

## 2021-10-15 DIAGNOSIS — R7689 Other specified abnormal immunological findings in serum: Secondary | ICD-10-CM

## 2021-10-15 DIAGNOSIS — R1013 Epigastric pain: Secondary | ICD-10-CM

## 2021-10-15 DIAGNOSIS — R768 Other specified abnormal immunological findings in serum: Secondary | ICD-10-CM

## 2021-10-15 NOTE — Telephone Encounter (Signed)
[place], ?Please contact the patient and tell her do NOT go on a gluten-free diet or avoid gluten until after her endoscopy.  The more gluten exposure prior to the endoscopy, the better.  If the biopsies are abnormal, then we would recommend a gluten-free diet. ?Thanks, ?Dr. Henrene Pastor ?

## 2021-10-15 NOTE — Telephone Encounter (Signed)
Telephone call ? ?10/15/2021 1:35 PM ? ?Called the patient to relay results from recent alk phos work-up.  Everything looks okay other than a possible finding of celiac disease.  Discussed with Dr. Henrene Pastor who explained that most likely liver enzymes are elevated from her known fatty liver, but EGD for further evaluation of possible celiac disease could be beneficial. ? ?Left message on patient's machine to call us back for above results.  Did not leave full explanation on answering machine. ? ?Ellouise Newer, PA-C ?

## 2021-10-15 NOTE — Telephone Encounter (Addendum)
Called patient and informed her of Dr. Blanch Media recommendations as outlined below. Pt advised to not go on a gluten-free diet or avoid gluten until after her EGD. She knows that if biopsies are abnormal then a gluten-free diet would be recommended. Pt verbalized understanding and had no concerns at the end of the call. ?

## 2021-10-15 NOTE — Telephone Encounter (Signed)
Telephone Call ? ?Called and talked with patient about results. She would like to proceed with EGD for further evaluation of possible celiac disease.  She is no longer on a blood thinner and only takes aspirin.  She was reading through some of the symptoms and they do check some of the boxes for her.  She did read peripheral neuropathy and is having this burning down her legs and wonders if it is related.  Apparently some nights it is worse than others and she wonders if it is related to her diet.  Also has a sister recently went on a gluten-free diet and "feels so much better", so she wonders if this could be the case for her. ? ?Explained that my nurse will call her to schedule this procedure with Dr. Henrene Pastor. ? ?Ellouise Newer, PA-C ?

## 2021-10-15 NOTE — Telephone Encounter (Signed)
Called pt to set up her EGD in the [facility] with Dr. Henrene Pastor. Pt has been scheduled for Tuesday, 11/25/21 at 10 am. Pt is aware that she will need to arrive on the 4th floor by 9 am with a care partner. Pt reports that she has access to her my chart. Pt is aware that I will send a copy of her instructions to her my chart and I will also mail her a copy. Pt verbalized understanding and had no concerns at the end of the call. ? ?Ambulatory referral to GI in epic. Instructions sent to patient via my chart and mailed today. ?

## 2021-10-15 NOTE — Addendum Note (Signed)
Addended by: Yevette Edwards on: 10/15/2021 02:36 PM ? ? Modules accepted: Orders ? ?

## 2021-10-16 ENCOUNTER — Other Ambulatory Visit: Payer: Self-pay

## 2021-10-16 ENCOUNTER — Ambulatory Visit (HOSPITAL_COMMUNITY)
Admission: RE | Admit: 2021-10-16 | Discharge: 2021-10-16 | Disposition: A | Discharge status: Home / Self Care | Payer: Managed Care, Other (non HMO) | Source: Ambulatory Visit | Attending: Physician Assistant | Admitting: Physician Assistant

## 2021-10-16 DIAGNOSIS — R748 Abnormal levels of other serum enzymes: Secondary | ICD-10-CM

## 2021-10-27 ENCOUNTER — Encounter: Payer: Self-pay | Admitting: Physician Assistant

## 2021-10-30 ENCOUNTER — Encounter: Payer: Self-pay | Admitting: Physician Assistant

## 2021-11-07 ENCOUNTER — Emergency Department
Admission: EM | Admit: 2021-11-07 | Discharge: 2021-11-07 | Disposition: A | Discharge status: Home / Self Care | Payer: Managed Care, Other (non HMO) | Attending: Emergency Medicine | Admitting: Emergency Medicine

## 2021-11-07 ENCOUNTER — Other Ambulatory Visit: Payer: Self-pay

## 2021-11-07 ENCOUNTER — Emergency Department: Payer: Managed Care, Other (non HMO)

## 2021-11-07 DIAGNOSIS — H539 Unspecified visual disturbance: Secondary | ICD-10-CM

## 2021-11-07 DIAGNOSIS — H538 Other visual disturbances: Secondary | ICD-10-CM | POA: Insufficient documentation

## 2021-11-07 DIAGNOSIS — R609 Edema, unspecified: Secondary | ICD-10-CM | POA: Insufficient documentation

## 2021-11-07 DIAGNOSIS — J45909 Unspecified asthma, uncomplicated: Secondary | ICD-10-CM | POA: Insufficient documentation

## 2021-11-07 DIAGNOSIS — R200 Anesthesia of skin: Secondary | ICD-10-CM | POA: Diagnosis not present

## 2021-11-07 DIAGNOSIS — R202 Paresthesia of skin: Secondary | ICD-10-CM | POA: Diagnosis not present

## 2021-11-07 LAB — CBC
HCT: 42.7 % (ref 36.0–46.0)
Hemoglobin: 13.6 g/dL (ref 12.0–15.0)
MCH: 30.2 pg (ref 26.0–34.0)
MCHC: 31.9 g/dL (ref 30.0–36.0)
MCV: 94.7 fL (ref 80.0–100.0)
Platelets: 268 10*3/uL (ref 150–400)
RBC: 4.51 MIL/uL (ref 3.87–5.11)
RDW: 14 % (ref 11.5–15.5)
WBC: 6.8 10*3/uL (ref 4.0–10.5)
nRBC: 0 % (ref 0.0–0.2)

## 2021-11-07 LAB — DIFFERENTIAL
Abs Immature Granulocytes: 0.01 10*3/uL (ref 0.00–0.07)
Basophils Absolute: 0.1 10*3/uL (ref 0.0–0.1)
Basophils Relative: 1 %
Eosinophils Absolute: 0.2 10*3/uL (ref 0.0–0.5)
Eosinophils Relative: 4 %
Immature Granulocytes: 0 %
Lymphocytes Relative: 31 %
Lymphs Abs: 2.1 10*3/uL (ref 0.7–4.0)
Monocytes Absolute: 0.4 10*3/uL (ref 0.1–1.0)
Monocytes Relative: 6 %
Neutro Abs: 3.9 10*3/uL (ref 1.7–7.7)
Neutrophils Relative %: 58 %

## 2021-11-07 LAB — COMPREHENSIVE METABOLIC PANEL
ALT: 20 U/L (ref 0–44)
AST: 23 U/L (ref 15–41)
Albumin: 4.1 g/dL (ref 3.5–5.0)
Alkaline Phosphatase: 119 U/L (ref 38–126)
Anion gap: 8 (ref 5–15)
BUN: 11 mg/dL (ref 6–20)
CO2: 28 mmol/L (ref 22–32)
Calcium: 9.2 mg/dL (ref 8.9–10.3)
Chloride: 102 mmol/L (ref 98–111)
Creatinine, Ser: 0.8 mg/dL (ref 0.44–1.00)
GFR, Estimated: >60 mL/min (ref >60)
Glucose, Bld: 99 mg/dL (ref 70–99)
Potassium: 4.1 mmol/L (ref 3.5–5.1)
Sodium: 138 mmol/L (ref 135–145)
Total Bilirubin: 1 mg/dL (ref 0.3–1.2)
Total Protein: 8 g/dL (ref 6.5–8.1)

## 2021-11-07 LAB — CBG MONITORING, ED: Glucose-Capillary: 76 mg/dL (ref 70–99)

## 2021-11-07 LAB — APTT: aPTT: 28 seconds (ref 24–36)

## 2021-11-07 LAB — PROTIME-INR
INR: 1 (ref 0.8–1.2)
Prothrombin Time: 13.5 seconds (ref 11.4–15.2)

## 2021-11-07 MED ORDER — SODIUM CHLORIDE 0.9% FLUSH: 3.0000 mL | Freq: Once | INTRAVENOUS | Status: DC

## 2021-11-07 NOTE — Code Documentation (Signed)
Stroke Response Nurse Documentation ?Code Documentation ? ?Denise Gamble is a 56 y.o. female arriving to [facility] ED via [employer] on 3/31 with past medical hx of TIA in February. On aspirin 81 mg daily. Code stroke was activated by Triage RN.  ? ?Patient from home where she was LKW at 1300 and now complaining of left sided upper lip numbness only.  ? ?Stroke team at the bedside on patient arrival. Labs drawn and patient cleared for CT by Dr. Kerman Passey. Patient to CT with team. NIHSS 2, see documentation for details and code stroke times. The following imaging was completed:  CT Head. Patient is not a candidate for IV Thrombolytic due to mild symptoms per Dr Leonel Ramsay.  ?Care/Plan: Pt to have MRI, If neg may d/c home, call neuro if positive per Dr Leonel Ramsay.  ? ?Bedside handoff with ED RN Denise Gamble.   ? ?Velta Addison ?Stroke Coordinator RN ?  ?

## 2021-11-07 NOTE — ED Notes (Signed)
Pt taken to CT at this time.

## 2021-11-07 NOTE — ED Provider Notes (Signed)
? ?[facility] ?Provider Note ? ? ? Event Date/Time  ? First MD Initiated Contact with Patient 11/07/21 1500   ?  (approximate) ? ? ?History  ? ?Numbness ? ? ?HPI ? ?Denise Gamble is a 56 y.o. female with past medical history of GERD, asthma, peripheral neuropathy and TIA some very subtle left-sided facial droop at baseline per report on daily ASA presents for evaluation of some tingling in her left upper lip associated with vision changes starting around 1230 today.  Patient states she saw some spots in her vision like she was "looking into the sun" as well as some shimmering like "heat coming off hot pavement".  This lasted about 5 to 7 minutes.  She states she feels almost back to normal now.  No recent injuries or falls, headache, vertigo, double vision, chest pain, cough, shortness of breath, nausea, vomiting, diarrhea, rash, urinary symptoms or focal extremity weakness numbness or tingling.  No other acute concerns at this time.  She denies EtOH use, illicit drug use or tobacco abuse. ? ?  ?Past Medical History:  ?Diagnosis Date  ? Allergy   ? seasonal allergies  ? Asthma   ? adult onset (1998)  ? GERD (gastroesophageal reflux disease)   ? for PRN use  ? History of MRSA infection   ? Miscarriage 2013  ? multiple   ? Paresthesia of lower extremity   ? Peripheral neuropathy 2019  ? left foot  ? Plantar fasciitis of left foot   ? Thoracic outlet syndrome   ? TIA (transient ischemic attack) 09/11/2021  ? Urinary incontinence   ? ? ? ?Physical Exam  ?Triage Vital Signs: ?ED Triage Vitals  ?Enc Vitals Group  ?   BP 11/07/21 1426 (!) 151/96  ?   Pulse Rate 11/07/21 1426 74  ?   Resp 11/07/21 1426 18  ?   Temp 11/07/21 1426 97.7 ?F (36.5 ?C)  ?   Temp src --   ?   SpO2 11/07/21 1426 100 %  ?   Weight --   ?   Height --   ?   Head Circumference --   ?   Peak Flow --   ?   Pain Score 11/07/21 1426 0  ?   Pain Loc --   ?   Pain Edu? --   ?   Excl. in [place]? --   ? ? ?Most recent vital  signs: ?Vitals:  ? 11/07/21 1600 11/07/21 1630  ?BP: (!) 115/99 118/84  ?Pulse: 63 62  ?Resp: 14 17  ?Temp:    ?SpO2: 98% 100%  ? ? ?General: Awake, no distress.  ?CV:  Good peripheral perfusion.  ?Resp:  Normal effort.  ?Abd:  No distention.  ?Other:  Cranial nerves II through XII grossly intact as do not appreciate any significant facial droop.  No pronator drift.  No finger dysmetria.  Symmetric 5/5 strength of all extremities.  Sensation intact to light touch in all extremities.  ? ? ?ED Results / Procedures / Treatments  ?Labs ?(all labs ordered are listed, but only abnormal results are displayed) ?Labs Reviewed  ?PROTIME-INR  ?APTT  ?CBC  ?DIFFERENTIAL  ?COMPREHENSIVE METABOLIC PANEL  ?CBG MONITORING, ED  ?I-STAT CREATININE, ED  ?POC URINE PREG, ED  ? ? ? ?EKG ? ?ECG is remarkable for sinus rhythm with a ventricular rate of 67, normal axis, unremarkable intervals with some artifact in the lateral leads without other clear evidence of acute ischemia or significant  arrhythmia. ? ? ?RADIOLOGY ? ?CT head Noncon interpreted by myself without evidence of acute ischemia, edema, hemorrhage, mass effect or other acute intracranial process.  I also reviewed radiology interpretation and agree with their findings of same. ? ?MRI brain interpreted by myself without evidence of hemorrhage, ischemia, aspect or other acute process.  I also reviewed radiologist interpretation. ? ?PROCEDURES: ? ?Critical Care performed: No ? ?.1-3 Lead EKG Interpretation ?Performed by: Lucrezia Starch, MD ?Authorized by: Lucrezia Starch, MD  ? ?  Interpretation: non-specific   ?  ECG rate assessment: bradycardic   ?  Rhythm: sinus rhythm   ?  Ectopy: none   ?  Conduction: normal   ? ?The patient is on the cardiac monitor to evaluate for evidence of arrhythmia and/or significant heart rate changes. ? ? ?MEDICATIONS ORDERED IN ED: ?Medications  ?sodium chloride flush (NS) 0.9 % injection 3 mL (0 mLs Intravenous Hold 11/07/21 1442)   ? ? ? ?IMPRESSION / MDM / ASSESSMENT AND PLAN / ED COURSE  ?I reviewed the triage vital signs and the nursing notes. ?             ?               ? ?Differential diagnosis includes, but is not limited to TIA, metabolic derangements, arrhythmia, CVA, atypical migraine. ? ?Patient made code stroke in triage prior to being seen by this examiner.  She was evaluated emergently at bedside by neurologist Dr. Leonel Ramsay. ? ?ECG is remarkable for sinus rhythm with a ventricular rate of 67, normal axis, unremarkable intervals with some artifact in the lateral leads without other clear evidence of acute ischemia or significant arrhythmia. ? ?CT head Noncon interpreted by myself without evidence of acute ischemia, edema, hemorrhage, mass effect or other acute intracranial process.  I also reviewed radiology interpretation and agree with their findings of same. ? ?MRI brain interpreted by myself without evidence of hemorrhage, ischemia, aspect or other acute process.  I also reviewed radiologist interpretation. ? ?CMP shows no significant electrolyte or metabolic derangements.  CBC without leukocytosis or acute anemia.  PT and PTT are unremarkable. ? ?Overall unclear etiology for patient's symptoms although she feels she is back to baseline without any significant neurological deficits on my exam I do not see any significant facial droop.  Per Dr. Saralyn Pilar given reassuring MRI she is stable for discharge with outpatient neurology follow-up.  I think this is reasonable and discussed with patient.  Discharged in stable condition.  Strict and precautions advised and discussed. ? ?  ? ? ?FINAL CLINICAL IMPRESSION(S) / ED DIAGNOSES  ? ?Final diagnoses:  ?Vision changes  ?Paresthesia  ? ? ? ?Rx / DC Orders  ? ?ED Discharge Orders   ? ? None  ? ?  ? ? ? ?Note:  This document was prepared using Dragon voice recognition software and may include unintentional dictation errors. ?  ?Lucrezia Starch, MD ?11/07/21 1643 ? ?

## 2021-11-07 NOTE — ED Notes (Signed)
Pt taken to MRI at this time ?

## 2021-11-07 NOTE — ED Notes (Signed)
Charge called to inform of CODE STROKE ?

## 2021-11-07 NOTE — Progress Notes (Signed)
CODE STROKE- PHARMACY COMMUNICATION ? ? ?Time CODE STROKE called/page received:1430 ? ?Time response to CODE STROKE was made (in person or via phone): 1435 ? ?Time Stroke Kit retrieved from [place] (only if needed):n/a ? ?Name of Provider/Nurse contacted:  ? ?Past Medical History:  ?Diagnosis Date  ? Allergy   ? seasonal allergies  ? Asthma   ? adult onset (1998)  ? GERD (gastroesophageal reflux disease)   ? for PRN use  ? History of MRSA infection   ? Miscarriage 2013  ? multiple   ? Paresthesia of lower extremity   ? Peripheral neuropathy 2019  ? left foot  ? Plantar fasciitis of left foot   ? Thoracic outlet syndrome   ? TIA (transient ischemic attack) 09/11/2021  ? Urinary incontinence   ? ?Prior to Admission medications   ?Medication Sig Start Date End Date Taking? Authorizing Provider  ?ADVAIR HFA 45-21 MCG/ACT inhaler USE 2 INHALATIONS TWICE A DAY ?Patient taking differently: 2 puffs 2 (two) times daily as needed (shortness of breath). 06/03/21   [name], PA-C  ?albuterol (VENTOLIN HFA) 108 (90 Base) MCG/ACT inhaler USE 2 INHALATIONS EVERY 4 HOURS AS NEEDED FOR WHEEZING OR SHORTNESS OF BREATH ?Patient taking differently: 2 puffs every 4 (four) hours as needed for wheezing or shortness of breath. 09/16/20   [name], PA-C  ?aspirin EC 81 MG EC tablet Take 1 tablet (81 mg total) by mouth daily. 09/13/21   Patrecia Pour, MD  ?atorvastatin (LIPITOR) 80 MG tablet Take 1 tablet (80 mg total) by mouth daily. 09/13/21   Patrecia Pour, MD  ?cetirizine (ZYRTEC) 10 MG tablet Take 10 mg by mouth daily.    [provider]  ?cholecalciferol (VITAMIN D3) 25 MCG (1000 UNIT) tablet Take 1,000 Units by mouth daily.    [provider]  ?montelukast (SINGULAIR) 10 MG tablet TAKE 1 TABLET AT BEDTIME ?Patient taking differently: Take 10 mg by mouth at bedtime. 08/05/21   [name], PA-C  ?Omega-3 Fatty Acids (EQL OMEGA 3 FISH OIL) 1000 MG CPDR Take 1,000 mg by mouth daily.    [provider]   ?vitamin B-12 (CYANOCOBALAMIN) 1000 MCG tablet Take 1,000 mcg by mouth daily.    [provider]  ? ? ?Noralee Space ,PharmD ?Clinical Pharmacist  ?11/07/2021  3:40 PM ?  ?

## 2021-11-07 NOTE — Consult Note (Signed)
Neurology Consultation ?Reason for Consult: Lip numbness ?Referring Physician: Creig Hines ? ?CC: Lip numbness ? ?History is obtained from: Patient ? ?HPI: Denise Gamble is a 56 y.o. female with a history of a recent TIA a month ago with normal MRI/MRA head and carotid Dopplers.  She presents with left upper lip numbness that has been present since approximately 1 PM.  She had left sided numbness with her previous episode, this started in her face and spread down to her arm over the course of 30 minutes.  She denies any headache with either of these episodes.  She denies photophobia. ? ?Of note, she has some mild facial asymmetry at baseline, this was confirmed by review of previous pictures on her phone. ? ?She was discharged previously on aspirin and Plavix, continue this for 3 weeks and now is on aspirin monotherapy and with atorvastatin 80 mg daily. ? ?LKW: 1 PM ?tpa given?: no, mild symptoms ? ? ?ROS: A 14 point ROS was performed and is negative except as noted in the HPI.  ?Past Medical History:  ?Diagnosis Date  ? Allergy   ? seasonal allergies  ? Asthma   ? adult onset (1998)  ? GERD (gastroesophageal reflux disease)   ? for PRN use  ? History of MRSA infection   ? Miscarriage 2013  ? multiple   ? Paresthesia of lower extremity   ? Peripheral neuropathy 2019  ? left foot  ? Plantar fasciitis of left foot   ? Thoracic outlet syndrome   ? TIA (transient ischemic attack) 09/11/2021  ? Urinary incontinence   ? ? ? ?Family History  ?Problem Relation Age of Onset  ? Heart disease Mother 79  ?     epstein anolmay   ? Dementia Mother   ? Colon polyps Mother   ? Diverticulitis Mother   ? Colon cancer Father 23  ? Skin cancer Father   ?     ? thinks melanoma?   ? Leukemia Father   ?     AAL  ? Hyperlipidemia Father   ? Hypertension Father   ? Colon polyps Father 53  ? Heart disease Brother   ? Alcohol abuse Brother   ? Diabetes Brother   ? Hypertension Brother   ? Prostate cancer Brother   ? Coronary artery disease  Brother   ? Colon polyps Brother   ? Hyperlipidemia Sister   ? Hypertension Sister   ? Stroke Sister 6  ? Brain cancer Sister 27  ?     mets from lung  ? Lung cancer Sister 7  ?     mets to brain-then stroke  ? Heart attack Maternal Grandfather   ? Hypertension Sister   ? Hypertension Sister   ? Colon polyps Brother 21  ? Alzheimer's disease Maternal Aunt   ? Breast cancer Neg Hx   ? Esophageal cancer Neg Hx   ? Stomach cancer Neg Hx   ? Rectal cancer Neg Hx   ? ? ? ?Social History:  reports that she has never smoked. She has never used smokeless tobacco. She reports that she does not drink alcohol and does not use drugs. ? ? ?Exam: ?Current vital signs: ?BP 121/84   Pulse (!) 58   Temp 97.7 ?F (36.5 ?C)   Resp 15   LMP 03/29/2019   SpO2 97%  ?Vital signs in last 24 hours: ?Temp:  [97.7 ?F (36.5 ?C)] 97.7 ?F (36.5 ?C) (03/31 1426) ?Pulse Rate:  [58-74] 58 (  03/31 1549) ?Resp:  [14-18] 15 (03/31 1549) ?BP: (121-151)/(70-96) 121/84 (03/31 1549) ?SpO2:  [97 %-100 %] 97 % (03/31 1549) ? ? ?Physical Exam  ?Constitutional: Appears well-developed and well-nourished.  ?Psych: Affect appropriate to situation ?Eyes: No scleral injection ?HENT: No OP obstruction ?MSK: no joint deformities.  ?Cardiovascular: Normal rate and regular rhythm.  ?Respiratory: Effort normal, non-labored breathing ?GI: Soft.  No distension. There is no tenderness.  ?Skin: WDI ? ?Neuro: ?Mental Status: ?Patient is awake, alert, oriented to person, place, month, year, and situation. ?Patient is able to give a clear and coherent history. ?No signs of aphasia or neglect ?Cranial Nerves: ?II: Visual Fields are full. Pupils are equal, round, and reactive to light.   ?III,IV, VI: EOMI without ptosis or diploplia.  ?V: Facial sensation is symmetric to temperature ?VII: Facial movement with mild decreased nasolabial fold on the right (appears baseline per review of previous photos) ?VIII: hearing is intact to voice ?X: Uvula elevates symmetrically ?XI:  Shoulder shrug is symmetric. ?XII: tongue is midline without atrophy or fasciculations.  ?Motor: ?Tone is normal. Bulk is normal. 5/5 strength was present in all four extremities.  ?Sensory: ?Sensation is symmetric to light touch and temperature in the arms and legs. ?Cerebellar: ?FNF and HKS are intact bilaterally ? ? ?I have reviewed labs in epic and the results pertinent to this consultation are: ?Creatinine 0.8 ? ?I have reviewed the images obtained: CT head-negative ? ?Impression: 56 year old female with left lip numbness.  She is already being managed for her previous TIA with antiplatelets and aggressive antilipid management.  I am not certain that repeating a work-up would be likely to give any further guidance at this time if her MRI is negative.  Another negative MRI would call and do question possible other etiologies such as migraine aura without headache (suggested by the timeline of progression of symptoms described previously.) ? ?At this point, I would do an MRI, if positive for stroke then can use this to further guide appropriate secondary prevention, but if negative then I would not pursue any further work-up at this time. ? ?Recommendations: ?1) MRI brain ?2) further work-up only if MRI is positive. ? ? ?Roland Rack, MD ?[facility] ?[phone] ? ?If 7pm- 7am, please page neurology on call as listed in [place]. ? ?

## 2021-11-07 NOTE — ED Notes (Signed)
Secretary called and informed of CODE STROKE, all info given at this time. ?

## 2021-11-07 NOTE — ED Notes (Signed)
Pt back from MRI at this time ?

## 2021-11-07 NOTE — ED Notes (Signed)
CT called and informed of CODE STROKE, per CT they are finding a room ? ?CT states bring pt to CT 3 ?

## 2021-11-07 NOTE — ED Triage Notes (Signed)
Patient to ER via [facility] with complaints of numbness on left upper lip that started an hour and a half ago. Reports her vision feels normal but her "eye socket feels weird" LKW 1300. Denies extremity weakness or numbness.  ? ?Reports hx of TIA in February.  ? ?Patient takes aspirin daily, last dose last night.  ?

## 2021-11-07 NOTE — ED Notes (Signed)
Neuro MD and Stroke Coordinator at bedside examining pt at this time.  ?

## 2021-11-12 ENCOUNTER — Encounter: Payer: Self-pay | Admitting: Physician Assistant

## 2021-11-12 ENCOUNTER — Other Ambulatory Visit: Payer: Self-pay

## 2021-11-12 DIAGNOSIS — E785 Hyperlipidemia, unspecified: Secondary | ICD-10-CM

## 2021-11-12 MED ORDER — ATORVASTATIN CALCIUM 40 MG PO TABS: 80.0000 mg | ORAL_TABLET | Freq: Every day | ORAL | 1 refills | Status: DC

## 2021-11-13 ENCOUNTER — Ambulatory Visit: Payer: Managed Care, Other (non HMO) | Admitting: Physician Assistant

## 2021-11-13 NOTE — Progress Notes (Deleted)
? ?Established Patient Office Visit ? ?Subjective:  ?Patient ID: Denise Gamble, female    DOB: Aug 23, 1965  Age: 56 y.o. MRN: [id number] ? ?CC: No chief complaint on file. ? ? ?HPI ?[name] presents for *** ? ?Past Medical History:  ?Diagnosis Date  ? Allergy   ? seasonal allergies  ? Asthma   ? adult onset (1998)  ? GERD (gastroesophageal reflux disease)   ? for PRN use  ? History of MRSA infection   ? Miscarriage 2013  ? multiple   ? Paresthesia of lower extremity   ? Peripheral neuropathy 2019  ? left foot  ? Plantar fasciitis of left foot   ? Thoracic outlet syndrome   ? TIA (transient ischemic attack) 09/11/2021  ? Urinary incontinence   ? ? ?Past Surgical History:  ?Procedure Laterality Date  ? DILATION AND CURETTAGE OF UTERUS  2011  ? LYMPHADENECTOMY Left 1979  ? WISDOM TOOTH EXTRACTION    ? ? ?Family History  ?Problem Relation Age of Onset  ? Heart disease Mother 64  ?     epstein anolmay   ? Dementia Mother   ? Colon polyps Mother   ? Diverticulitis Mother   ? Colon cancer Father 47  ? Skin cancer Father   ?     ? thinks melanoma?   ? Leukemia Father   ?     AAL  ? Hyperlipidemia Father   ? Hypertension Father   ? Colon polyps Father 88  ? Heart disease Brother   ? Alcohol abuse Brother   ? Diabetes Brother   ? Hypertension Brother   ? Prostate cancer Brother   ? Coronary artery disease Brother   ? Colon polyps Brother   ? Hyperlipidemia Sister   ? Hypertension Sister   ? Stroke Sister 49  ? Brain cancer Sister 52  ?     mets from lung  ? Lung cancer Sister 60  ?     mets to brain-then stroke  ? Heart attack Maternal Grandfather   ? Hypertension Sister   ? Hypertension Sister   ? Colon polyps Brother 89  ? Alzheimer's disease Maternal Aunt   ? Breast cancer Neg Hx   ? Esophageal cancer Neg Hx   ? Stomach cancer Neg Hx   ? Rectal cancer Neg Hx   ? ? ?Social History  ? ?Socioeconomic History  ? Marital status: Married  ?  Spouse name: Eddie Dibbles  ? Number of children: 0  ? Years of education: Not on file   ? Highest education level: Master's degree (e.g., MA, MS, MEng, MEd, MSW, MBA)  ?Occupational History  ? Not on file  ?Tobacco Use  ? Smoking status: Never  ? Smokeless tobacco: Never  ?Vaping Use  ? Vaping Use: Never used  ?Substance and Sexual Activity  ? Alcohol use: No  ?  Alcohol/week: 0.0 standard drinks  ?  Comment: quit 2010  ? Drug use: No  ? Sexual activity: Yes  ?  Comment: perimenopausal  ?Other Topics Concern  ? Not on file  ?Social History Narrative  ? Works for [employer]  ? Goes to Employee health  ? Married  ? No caffeine  ? Right handed  ?   ?   ?   ? ?Social Determinants of Health  ? ?Financial Resource Strain: Not on file  ?Food Insecurity: Not on file  ?Transportation Needs: Not on file  ?Physical Activity: Not on file  ?Stress: Not on  file  ?Social Connections: Not on file  ?Intimate Partner Violence: Not on file  ? ? ?Outpatient Medications Prior to Visit  ?Medication Sig Dispense Refill  ? ADVAIR HFA 45-21 MCG/ACT inhaler USE 2 INHALATIONS TWICE A DAY (Patient taking differently: 2 puffs 2 (two) times daily as needed (shortness of breath).) 12 g 11  ? albuterol (VENTOLIN HFA) 108 (90 Base) MCG/ACT inhaler USE 2 INHALATIONS EVERY 4 HOURS AS NEEDED FOR WHEEZING OR SHORTNESS OF BREATH (Patient taking differently: 2 puffs every 4 (four) hours as needed for wheezing or shortness of breath.) 17 g 1  ? aspirin EC 81 MG EC tablet Take 1 tablet (81 mg total) by mouth daily. 30 tablet 1  ? atorvastatin (LIPITOR) 40 MG tablet Take 2 tablets (80 mg total) by mouth daily. 30 tablet 1  ? cetirizine (ZYRTEC) 10 MG tablet Take 10 mg by mouth daily.    ? cholecalciferol (VITAMIN D3) 25 MCG (1000 UNIT) tablet Take 1,000 Units by mouth daily.    ? montelukast (SINGULAIR) 10 MG tablet TAKE 1 TABLET AT BEDTIME (Patient taking differently: Take 10 mg by mouth at bedtime.) 90 tablet 1  ? Omega-3 Fatty Acids (EQL OMEGA 3 FISH OIL) 1000 MG CPDR Take 1,000 mg by mouth daily.    ? vitamin B-12 (CYANOCOBALAMIN)  1000 MCG tablet Take 1,000 mcg by mouth daily.    ? ?No facility-administered medications prior to visit.  ? ? ?Allergies  ?Allergen Reactions  ? Pollen Extract Hives  ?  All tree pollens and dust mites  ? Latex Hives  ? ? ?ROS ?Review of Systems ? ?  ?Objective:  ?  ?Physical Exam ? ?LMP 03/29/2019  ?Wt Readings from Last 3 Encounters:  ?10/13/21 237 lb (107.5 kg)  ?09/19/21 233 lb (105.7 kg)  ?09/11/21 194 lb 9.6 oz (88.3 kg)  ? ? ? ?Health Maintenance Due  ?Topic Date Due  ? PAP SMEAR-Modifier  12/15/2019  ? COVID-19 Vaccine (2 - Booster for Janssen series) 01/13/2020  ? Zoster Vaccines- Shingrix (2 of 2) 02/07/2020  ? ? ?There are no preventive care reminders to display for this patient. ? ?Lab Results  ?Component Value Date  ? TSH 2.034 09/11/2021  ? ?Lab Results  ?Component Value Date  ? WBC 6.8 11/07/2021  ? HGB 13.6 11/07/2021  ? HCT 42.7 11/07/2021  ? MCV 94.7 11/07/2021  ? PLT 268 11/07/2021  ? ?Lab Results  ?Component Value Date  ? NA 138 11/07/2021  ? K 4.1 11/07/2021  ? CO2 28 11/07/2021  ? GLUCOSE 99 11/07/2021  ? BUN 11 11/07/2021  ? CREATININE 0.80 11/07/2021  ? BILITOT 1.0 11/07/2021  ? ALKPHOS 119 11/07/2021  ? AST 23 11/07/2021  ? ALT 20 11/07/2021  ? PROT 8.0 11/07/2021  ? ALBUMIN 4.1 11/07/2021  ? CALCIUM 9.2 11/07/2021  ? ANIONGAP 8 11/07/2021  ? EGFR 88 09/26/2021  ? GFR 76.03 10/09/2021  ? ?Lab Results  ?Component Value Date  ? CHOL 236 (H) 09/11/2021  ? ?Lab Results  ?Component Value Date  ? HDL 50 09/11/2021  ? ?Lab Results  ?Component Value Date  ? LDLCALC 169 (H) 09/11/2021  ? ?Lab Results  ?Component Value Date  ? TRIG 85 09/11/2021  ? ?Lab Results  ?Component Value Date  ? CHOLHDL 4.7 09/11/2021  ? ?Lab Results  ?Component Value Date  ? HGBA1C 5.2 09/11/2021  ? ? ?  ?Assessment & Plan:  ? ?Problem List Items Addressed This Visit   ?None ? ? ?  No orders of the defined types were placed in this encounter. ? ? ?Follow-up: No follow-ups on file.  ? ? ?Aron Baba, [place] ?

## 2021-11-19 NOTE — Progress Notes (Signed)
? ?Established Patient Office Visit ? ?Subjective:  ?Patient ID: Denise Gamble, female    DOB: 03/28/1966  Age: 56 y.o. MRN: [id number] ? ?CC:  ?Chief Complaint  ?Patient presents with  ? Hospitalization Follow-up  ? ? ?HPI ?[name] presents for ED follow-up. Patient reports still has nausea with movement and has an upcoming videonystagmoraphy with ENT. Patient reports has not experienced transient vision loss. States has noticed a significant improvement with reduced dose of atorvastatin. No having the weird head or joint pains. Does not feel as tired. Feels like her normal self again.  ? ?ED HPI: ?[name] is a 56 y.o. female with past medical history of GERD, asthma, peripheral neuropathy and TIA some very subtle left-sided facial droop at baseline per report on daily ASA presents for evaluation of some tingling in her left upper lip associated with vision changes starting around 1230 today.  Patient states she saw some spots in her vision like she was "looking into the sun" as well as some shimmering like "heat coming off hot pavement".  This lasted about 5 to 7 minutes.  She states she feels almost back to normal now.  No recent injuries or falls, headache, vertigo, double vision, chest pain, cough, shortness of breath, nausea, vomiting, diarrhea, rash, urinary symptoms or focal extremity weakness numbness or tingling.  No other acute concerns at this time.  She denies EtOH use, illicit drug use or tobacco abuse. ? ?Past Medical History:  ?Diagnosis Date  ? Allergy   ? seasonal allergies  ? Asthma   ? adult onset (1998)  ? GERD (gastroesophageal reflux disease)   ? for PRN use  ? History of MRSA infection   ? Miscarriage 2013  ? multiple   ? Paresthesia of lower extremity   ? Peripheral neuropathy 2019  ? left foot  ? Plantar fasciitis of left foot   ? Thoracic outlet syndrome   ? TIA (transient ischemic attack) 09/11/2021  ? Urinary incontinence   ? ? ?Past Surgical History:  ?Procedure  Laterality Date  ? DILATION AND CURETTAGE OF UTERUS  2011  ? LYMPHADENECTOMY Left 1979  ? WISDOM TOOTH EXTRACTION    ? ? ?Family History  ?Problem Relation Age of Onset  ? Heart disease Mother 50  ?     epstein anolmay   ? Dementia Mother   ? Colon polyps Mother   ? Diverticulitis Mother   ? Colon cancer Father 27  ? Skin cancer Father   ?     ? thinks melanoma?   ? Leukemia Father   ?     AAL  ? Hyperlipidemia Father   ? Hypertension Father   ? Colon polyps Father 98  ? Heart disease Brother   ? Alcohol abuse Brother   ? Diabetes Brother   ? Hypertension Brother   ? Prostate cancer Brother   ? Coronary artery disease Brother   ? Colon polyps Brother   ? Hyperlipidemia Sister   ? Hypertension Sister   ? Stroke Sister 63  ? Brain cancer Sister 43  ?     mets from lung  ? Lung cancer Sister 54  ?     mets to brain-then stroke  ? Heart attack Maternal Grandfather   ? Hypertension Sister   ? Hypertension Sister   ? Colon polyps Brother 10  ? Alzheimer's disease Maternal Aunt   ? Breast cancer Neg Hx   ? Esophageal cancer Neg Hx   ?  Stomach cancer Neg Hx   ? Rectal cancer Neg Hx   ? ? ?Social History  ? ?Socioeconomic History  ? Marital status: Married  ?  Spouse name: Eddie Dibbles  ? Number of children: 0  ? Years of education: Not on file  ? Highest education level: Master's degree (e.g., MA, MS, MEng, MEd, MSW, MBA)  ?Occupational History  ? Not on file  ?Tobacco Use  ? Smoking status: Never  ? Smokeless tobacco: Never  ?Vaping Use  ? Vaping Use: Never used  ?Substance and Sexual Activity  ? Alcohol use: No  ?  Alcohol/week: 0.0 standard drinks  ?  Comment: quit 2010  ? Drug use: No  ? Sexual activity: Yes  ?  Comment: perimenopausal  ?Other Topics Concern  ? Not on file  ?Social History Narrative  ? Works for [employer]  ? Goes to Employee health  ? Married  ? No caffeine  ? Right handed  ?   ?   ?   ? ?Social Determinants of Health  ? ?Financial Resource Strain: Not on file  ?Food Insecurity: Not on file   ?Transportation Needs: Not on file  ?Physical Activity: Not on file  ?Stress: Not on file  ?Social Connections: Not on file  ?Intimate Partner Violence: Not on file  ? ? ?Outpatient Medications Prior to Visit  ?Medication Sig Dispense Refill  ? ADVAIR HFA 45-21 MCG/ACT inhaler USE 2 INHALATIONS TWICE A DAY (Patient taking differently: 2 puffs 2 (two) times daily as needed (shortness of breath).) 12 g 11  ? albuterol (VENTOLIN HFA) 108 (90 Base) MCG/ACT inhaler USE 2 INHALATIONS EVERY 4 HOURS AS NEEDED FOR WHEEZING OR SHORTNESS OF BREATH (Patient taking differently: 2 puffs every 4 (four) hours as needed for wheezing or shortness of breath.) 17 g 1  ? aspirin EC 81 MG EC tablet Take 1 tablet (81 mg total) by mouth daily. 30 tablet 1  ? atorvastatin (LIPITOR) 40 MG tablet Take 2 tablets (80 mg total) by mouth daily. 30 tablet 1  ? cetirizine (ZYRTEC) 10 MG tablet Take 10 mg by mouth daily.    ? cholecalciferol (VITAMIN D3) 25 MCG (1000 UNIT) tablet Take 1,000 Units by mouth daily.    ? montelukast (SINGULAIR) 10 MG tablet TAKE 1 TABLET AT BEDTIME (Patient taking differently: Take 10 mg by mouth at bedtime.) 90 tablet 1  ? Omega-3 Fatty Acids (EQL OMEGA 3 FISH OIL) 1000 MG CPDR Take 1,000 mg by mouth daily.    ? vitamin B-12 (CYANOCOBALAMIN) 1000 MCG tablet Take 1,000 mcg by mouth daily.    ? ?No facility-administered medications prior to visit.  ? ? ?Allergies  ?Allergen Reactions  ? Pollen Extract Hives  ?  All tree pollens and dust mites  ? Latex Hives  ? ? ?ROS ?Review of Systems ?Review of Systems:  ?A fourteen system review of systems was performed and found to be positive as per HPI. ? ?  ?Objective:  ?  ?Physical Exam ?General:  Well Developed, well nourished, appropriate for stated age.  ?Neuro:  Alert and oriented,  extra-ocular muscles intact, no peripheral nystagmus noted  ?HEENT:  Normocephalic, atraumatic, neck supple  ?Skin:  no gross rash, warm, pink. ?Cardiac:  RRR, S1 S2 ?Respiratory: CTA B/L   ?Vascular:  Ext warm, no cyanosis apprec.; cap RF less 2 sec. ?Psych:  No HI/SI, judgement and insight good, Euthymic mood. Full Affect. ? ?BP 113/76   Pulse 70   Temp 98 ?F (36.7 ?  C)   Ht 5' 3"  (1.6 m)   Wt 229 lb (103.9 kg)   LMP 03/29/2019   SpO2 98%   BMI 40.57 kg/m?  ?Wt Readings from Last 3 Encounters:  ?11/20/21 229 lb (103.9 kg)  ?10/13/21 237 lb (107.5 kg)  ?09/19/21 233 lb (105.7 kg)  ? ? ? ?Health Maintenance Due  ?Topic Date Due  ? PAP SMEAR-Modifier  12/15/2019  ? Zoster Vaccines- Shingrix (2 of 2) 02/07/2020  ? ? ?There are no preventive care reminders to display for this patient. ? ?Lab Results  ?Component Value Date  ? TSH 2.034 09/11/2021  ? ?Lab Results  ?Component Value Date  ? WBC 6.8 11/07/2021  ? HGB 13.6 11/07/2021  ? HCT 42.7 11/07/2021  ? MCV 94.7 11/07/2021  ? PLT 268 11/07/2021  ? ?Lab Results  ?Component Value Date  ? NA 138 11/07/2021  ? K 4.1 11/07/2021  ? CO2 28 11/07/2021  ? GLUCOSE 99 11/07/2021  ? BUN 11 11/07/2021  ? CREATININE 0.80 11/07/2021  ? BILITOT 1.0 11/07/2021  ? ALKPHOS 119 11/07/2021  ? AST 23 11/07/2021  ? ALT 20 11/07/2021  ? PROT 8.0 11/07/2021  ? ALBUMIN 4.1 11/07/2021  ? CALCIUM 9.2 11/07/2021  ? ANIONGAP 8 11/07/2021  ? EGFR 88 09/26/2021  ? GFR 76.03 10/09/2021  ? ?Lab Results  ?Component Value Date  ? CHOL 236 (H) 09/11/2021  ? ?Lab Results  ?Component Value Date  ? HDL 50 09/11/2021  ? ?Lab Results  ?Component Value Date  ? LDLCALC 169 (H) 09/11/2021  ? ?Lab Results  ?Component Value Date  ? TRIG 85 09/11/2021  ? ?Lab Results  ?Component Value Date  ? CHOLHDL 4.7 09/11/2021  ? ?Lab Results  ?Component Value Date  ? HGBA1C 5.2 09/11/2021  ? ? ?  ?Assessment & Plan:  ? ?Problem List Items Addressed This Visit   ? ?  ? Other  ? Hyperlipidemia - Primary  ? Relevant Orders  ? Comp Met (CMET)  ? Lipid Profile  ? CBC w/Diff  ? ?Other Visit Diagnoses   ? ? Vision changes      ? Dizziness      ? ?  ? ? ?Hyperlipidemia: ?-Will repeat lipid panel. LDL goal is  <70. ?-Tolerating reduced dose of statin much better so recommend to continue atorvastatin 40 mg.  ?-Continue with heart healthy diet. ? ?Vision changes: ?-Reviewed ED notes and labs. ?-Resolved. Recommend to monitor for re-occurrence

## 2021-11-20 ENCOUNTER — Ambulatory Visit (INDEPENDENT_AMBULATORY_CARE_PROVIDER_SITE_OTHER): Payer: Managed Care, Other (non HMO) | Admitting: Physician Assistant

## 2021-11-20 ENCOUNTER — Encounter: Payer: Self-pay | Admitting: Physician Assistant

## 2021-11-20 VITALS — BP 113/76 | HR 70 | Temp 98.0°F | Ht 63.0 in | Wt 229.0 lb

## 2021-11-20 DIAGNOSIS — R42 Dizziness and giddiness: Secondary | ICD-10-CM

## 2021-11-20 DIAGNOSIS — H539 Unspecified visual disturbance: Secondary | ICD-10-CM | POA: Diagnosis not present

## 2021-11-20 DIAGNOSIS — E785 Hyperlipidemia, unspecified: Secondary | ICD-10-CM | POA: Diagnosis not present

## 2021-11-21 LAB — CBC WITH DIFFERENTIAL/PLATELET
Basophils Absolute: 0.1 10*3/uL (ref 0.0–0.2)
Basos: 1 %
EOS (ABSOLUTE): 0.3 10*3/uL (ref 0.0–0.4)
Eos: 4 %
Hematocrit: 39.3 % (ref 34.0–46.6)
Hemoglobin: 12.8 g/dL (ref 11.1–15.9)
Immature Grans (Abs): 0 10*3/uL (ref 0.0–0.1)
Immature Granulocytes: 0 %
Lymphocytes Absolute: 1.9 10*3/uL (ref 0.7–3.1)
Lymphs: 32 %
MCH: 31.1 pg (ref 26.6–33.0)
MCHC: 32.6 g/dL (ref 31.5–35.7)
MCV: 95 fL (ref 79–97)
Monocytes Absolute: 0.4 10*3/uL (ref 0.1–0.9)
Monocytes: 7 %
Neutrophils Absolute: 3.4 10*3/uL (ref 1.4–7.0)
Neutrophils: 56 %
Platelets: 234 10*3/uL (ref 150–450)
RBC: 4.12 x10E6/uL (ref 3.77–5.28)
RDW: 13.3 % (ref 11.7–15.4)
WBC: 6 10*3/uL (ref 3.4–10.8)

## 2021-11-21 LAB — COMPREHENSIVE METABOLIC PANEL
ALT: 18 IU/L (ref 0–32)
AST: 22 IU/L (ref 0–40)
Albumin/Globulin Ratio: 1.5 (ref 1.2–2.2)
Albumin: 4 g/dL (ref 3.8–4.9)
Alkaline Phosphatase: 143 IU/L — ABNORMAL HIGH (ref 44–121)
BUN/Creatinine Ratio: 15 (ref 9–23)
BUN: 13 mg/dL (ref 6–24)
Bilirubin Total: 0.7 mg/dL (ref 0.0–1.2)
CO2: 26 mmol/L (ref 20–29)
Calcium: 9.3 mg/dL (ref 8.7–10.2)
Chloride: 105 mmol/L (ref 96–106)
Creatinine, Ser: 0.87 mg/dL (ref 0.57–1.00)
Globulin, Total: 2.7 g/dL (ref 1.5–4.5)
Glucose: 89 mg/dL (ref 70–99)
Potassium: 4.6 mmol/L (ref 3.5–5.2)
Sodium: 142 mmol/L (ref 134–144)
Total Protein: 6.7 g/dL (ref 6.0–8.5)
eGFR: 80 mL/min/{1.73_m2} (ref >59)

## 2021-11-21 LAB — LIPID PANEL
Chol/HDL Ratio: 2.7 ratio (ref 0.0–4.4)
Cholesterol, Total: 128 mg/dL (ref 100–199)
HDL: 48 mg/dL (ref >39)
LDL Chol Calc (NIH): 64 mg/dL (ref 0–99)
Triglycerides: 78 mg/dL (ref 0–149)
VLDL Cholesterol Cal: 16 mg/dL (ref 5–40)

## 2021-11-25 ENCOUNTER — Ambulatory Visit (AMBULATORY_SURGERY_CENTER): Payer: Managed Care, Other (non HMO) | Admitting: Internal Medicine

## 2021-11-25 ENCOUNTER — Encounter: Payer: Self-pay | Admitting: Internal Medicine

## 2021-11-25 VITALS — BP 104/73 | HR 61 | Temp 98.0°F | Resp 11 | Ht 63.0 in | Wt 216.0 lb

## 2021-11-25 DIAGNOSIS — R894 Abnormal immunological findings in specimens from other organs, systems and tissues: Secondary | ICD-10-CM

## 2021-11-25 DIAGNOSIS — R768 Other specified abnormal immunological findings in serum: Secondary | ICD-10-CM | POA: Diagnosis not present

## 2021-11-25 DIAGNOSIS — K900 Celiac disease: Secondary | ICD-10-CM

## 2021-11-25 DIAGNOSIS — R1013 Epigastric pain: Secondary | ICD-10-CM

## 2021-11-25 MED ORDER — SODIUM CHLORIDE 0.9 % IV SOLN: 500.0000 mL | Freq: Once | INTRAVENOUS | Status: DC

## 2021-11-25 NOTE — Progress Notes (Signed)
Sedate, gd SR, tolerated procedure well, VSS, report to RN 

## 2021-11-25 NOTE — Progress Notes (Signed)
Chief Complaint: Elevated alkaline phosphatase ?  ?HPI: ?   Denise Gamble is a 56 year old female, known to Dr. Henrene Pastor, with a past medical history as listed below including reflux, who was referred to me by [name], PA-C for a complaint of elevated alkaline phosphatase. ?   10/24/2018 abdominal ultrasound with slightly increased liver echogenicity suggesting mild fatty change. ?   07/22/2020 colonoscopy with a 2 mm polyp in the ascending colon and diverticulosis in the sigmoid colon.  Pathology showed tubular adenoma repeat recommended in 5 years. ?   09/19/2021 patient seen by PCP for follow-up after being in the hospital for suspicion of stroke.  She was started on dual antiplatelet therapy for 3 weeks, then aspirin and started on statin and told to follow-up with neurology in 6 to 8 weeks for TIA. ?   09/26/2021 alk phos elevated 136 (09/19/2021 144, 09/11/2021 normal at 102, 02/12/2021 123). ?   Today, the patient tells me that she has been told over the past couple of years that her alk phos has been elevated, a couple of times it has gone up and down and even back to normal at one-point, but more consistently it has been elevated.  Tells me she is trying to figure out what is going on as she has had a lot of strange things happening.  Tells me that in 2020 she started with burning of the skin that outside of her legs and no one has really been able to figure that out.  She was told that if it was due to nerve damage from her hips that it should get better within 3 years.  She is hoping this better by October.  Also describes some severe epigastric pain at times for which she had the ultrasound above at one point.  Explains that sometimes it will even happen in her sleep where she gets a severe pain like a 10 /10 that can last 1 to 2 minutes.  She will chew a Tums and is not sure if this helps or not but it can occur 4-6 times a year.  She has started sleeping with the head of her bed elevated. ?   Denies fever,  chills, weight loss, blood in her stool, change in bowel habits, heartburn, reflux, history of IV drug use or family history of liver disease. ?  ?    ?Past Medical History:  ?Diagnosis Date  ? Allergy    ?  seasonal allergies  ? Asthma    ?  adult onset (1998)  ? GERD (gastroesophageal reflux disease)    ?  for PRN use  ? History of MRSA infection    ? Miscarriage 2013  ?  multiple   ? Paresthesia of lower extremity    ? Peripheral neuropathy 2019  ?  left foot  ? Plantar fasciitis of left foot    ? Thoracic outlet syndrome    ? Urinary incontinence    ?  ?  ?     ?Past Surgical History:  ?Procedure Laterality Date  ? DILATION AND CURETTAGE OF UTERUS   2011  ? LYMPHADENECTOMY Left 1979  ? WISDOM TOOTH EXTRACTION      ?  ?  ?      ?Current Outpatient Medications  ?Medication Sig Dispense Refill  ? ADVAIR HFA 45-21 MCG/ACT inhaler USE 2 INHALATIONS TWICE A DAY (Patient taking differently: 2 puffs 2 (two) times daily as needed (shortness of breath).) 12 g 11  ? albuterol (VENTOLIN  HFA) 108 (90 Base) MCG/ACT inhaler USE 2 INHALATIONS EVERY 4 HOURS AS NEEDED FOR WHEEZING OR SHORTNESS OF BREATH (Patient taking differently: 2 puffs every 4 (four) hours as needed for wheezing or shortness of breath.) 17 g 1  ? aspirin EC 81 MG EC tablet Take 1 tablet (81 mg total) by mouth daily. 30 tablet 1  ? atorvastatin (LIPITOR) 80 MG tablet Take 1 tablet (80 mg total) by mouth daily. 30 tablet 1  ? cetirizine (ZYRTEC) 10 MG tablet Take 10 mg by mouth daily.      ? cholecalciferol (VITAMIN D3) 25 MCG (1000 UNIT) tablet Take 1,000 Units by mouth daily.      ? clotrimazole-betamethasone (LOTRISONE) cream SMARTSIG:In Ear(s)      ? montelukast (SINGULAIR) 10 MG tablet TAKE 1 TABLET AT BEDTIME (Patient taking differently: Take 10 mg by mouth at bedtime.) 90 tablet 1  ? Omega-3 Fatty Acids (EQL OMEGA 3 FISH OIL) 1000 MG CPDR Take 1,000 mg by mouth daily.      ? vitamin B-12 (CYANOCOBALAMIN) 1000 MCG tablet Take 1,000 mcg by mouth daily.       ?  ?No current facility-administered medications for this visit.  ?  ?  ?     ?Allergies as of 10/09/2021 - Review Complete 09/19/2021  ?Allergen Reaction Noted  ? Pollen extract Hives 05/28/2014  ? Latex Hives 02/25/2017  ?  ?  ?     ?Family History  ?Problem Relation Age of Onset  ? Heart disease Mother 32  ?      epstein anolmay   ? Dementia Mother    ? Colon polyps Mother    ? Diverticulitis Mother    ? Colon cancer Father 46  ? Skin cancer Father    ?      ? thinks melanoma?   ? Leukemia Father    ?      AAL  ? Hyperlipidemia Father    ? Hypertension Father    ? Colon polyps Father 14  ? Heart disease Brother    ? Alcohol abuse Brother    ? Diabetes Brother    ? Hypertension Brother    ? Prostate cancer Brother    ? Coronary artery disease Brother    ? Colon polyps Brother    ? Hyperlipidemia Sister    ? Hypertension Sister    ? Stroke Sister 31  ? Brain cancer Sister 2  ?      mets from lung  ? Lung cancer Sister 48  ?      mets to brain-then stroke  ? Heart attack Maternal Grandfather    ? Hypertension Sister    ? Hypertension Sister    ? Colon polyps Brother 35  ? Alzheimer's disease Maternal Aunt    ? Breast cancer Neg Hx    ? Esophageal cancer Neg Hx    ? Stomach cancer Neg Hx    ? Rectal cancer Neg Hx    ?  ?  ?Social History  ?  ?     ?Socioeconomic History  ? Marital status: Married  ?    Spouse name: Eddie Dibbles  ? Number of children: 0  ? Years of education: Not on file  ? Highest education level: Master's degree (e.g., MA, MS, MEng, MEd, MSW, MBA)  ?Occupational History  ? Not on file  ?Tobacco Use  ? Smoking status: Never  ? Smokeless tobacco: Never  ?Vaping Use  ? Vaping Use: Never used  ?Substance  and Sexual Activity  ? Alcohol use: No  ?    Alcohol/week: 0.0 standard drinks  ?    Comment: quit 2010  ? Drug use: No  ? Sexual activity: Yes  ?    Comment: perimenopausal  ?Other Topics Concern  ? Not on file  ?Social History Narrative  ?  Works for [employer]  ?  Goes to Employee health  ?  Married   ?  No caffeine  ?     ?     ?     ?  ?Social Determinants of Health  ?  ?Financial Resource Strain: Not on file  ?Food Insecurity: Not on file  ?Transportation Needs: Not on file  ?Physical Activity: Not on file  ?Stress: Not on file  ?Social Connections: Not on file  ?Intimate Partner Violence: Not on file  ?  ?  ?Review of Systems:    ?Constitutional: No weight loss, fever or chills ?Cardiovascular: No chest pain ?Respiratory: No SOB ?Gastrointestinal: See HPI and otherwise negative ?  ? Physical Exam:  ?Vital signs: ?BP 118/70   Pulse 87   Ht _0  (1.6 m)   LMP 03/29/2019   BMI 41.27 kg/m?   ?  ?Constitutional:   Pleasant obese Caucasian female appears to be in NAD, Well developed, Well nourished, alert and cooperative ?Respiratory: Respirations even and unlabored. Lungs clear to auscultation bilaterally.   No wheezes, crackles, or rhonchi.  ?Cardiovascular: Normal S1, S2. No MRG. Regular rate and rhythm. No peripheral edema, cyanosis or pallor.  ?Gastrointestinal:  Soft, nondistended, nontender. No rebound or guarding. Normal bowel sounds. No appreciable masses or hepatomegaly. ?Rectal:  Not performed.  ?Psychiatric: Demonstrates good judgement and reason without abnormal affect or behaviors. ?  ?RELEVANT LABS AND IMAGING: ?CBC ?Labs (Brief)  ?     ?   ?Component Value Date/Time  ?  WBC 5.9 09/19/2021 1041  ?  WBC 7.0 09/11/2021 1200  ?  RBC 4.52 09/19/2021 1041  ?  RBC 4.27 09/11/2021 1200  ?  HGB 14.1 09/19/2021 1041  ?  HCT 41.4 09/19/2021 1041  ?  PLT 268 09/19/2021 1041  ?  MCV 92 09/19/2021 1041  ?  MCH 31.2 09/19/2021 1041  ?  MCH 31.1 09/11/2021 1200  ?  MCHC 34.1 09/19/2021 1041  ?  MCHC 31.7 09/11/2021 1200  ?  RDW 12.7 09/19/2021 1041  ?  LYMPHSABS 1.5 09/19/2021 1041  ?  MONOABS 0.4 09/11/2021 1200  ?  EOSABS 0.2 09/19/2021 1041  ?  BASOSABS 0.0 09/19/2021 1041  ?  ?  ?  ?CMP     ?Labs (Brief)  ?     ?   ?Component Value Date/Time  ?  NA 140 09/26/2021 1007  ?  K 4.6 09/26/2021 1007  ?  CL 102  09/26/2021 1007  ?  CO2 26 09/26/2021 1007  ?  GLUCOSE 89 09/26/2021 1007  ?  GLUCOSE 90 09/11/2021 1215  ?  BUN 14 09/26/2021 1007  ?  CREATININE 0.80 09/26/2021 1007  ?  CALCIUM 9.2 09/26/2021 1007  ?  PR

## 2021-11-25 NOTE — Progress Notes (Signed)
VS DT ? ?Pt's states no medical or surgical changes since previsit or office visit. ? ?

## 2021-11-25 NOTE — Op Note (Signed)
[facility] ?Patient Name: Denise Gamble ?Procedure Date: 11/25/2021 10:00 AM ?MRN: [id number] ?Endoscopist: Docia Chuck. Henrene Pastor , MD ?Age: 56 ?Referring MD:  ?Date of Birth: 04-11-1966 ?Gender: Female ?Account #: [id number] ?Procedure:                Upper GI endoscopy with biopsies ?Indications:              Epigastric abdominal pain; elevated tissue  ?                          transglutaminase antibody IgA ?Medicines:                Monitored Anesthesia Care ?Procedure:                Pre-Anesthesia Assessment: ?                          - Prior to the procedure, a History and Physical  ?                          was performed, and patient medications and  ?                          allergies were reviewed. The patient's tolerance of  ?                          previous anesthesia was also reviewed. The risks  ?                          and benefits of the procedure and the sedation  ?                          options and risks were discussed with the patient.  ?                          All questions were answered, and informed consent  ?                          was obtained. Prior Anticoagulants: The patient has  ?                          taken no previous anticoagulant or antiplatelet  ?                          agents. ASA Grade Assessment: II - A patient with  ?                          mild systemic disease. After reviewing the risks  ?                          and benefits, the patient was deemed in  ?                          satisfactory condition to undergo the procedure. ?  After obtaining informed consent, the endoscope was  ?                          passed under direct vision. Throughout the  ?                          procedure, the patient's blood pressure, pulse, and  ?                          oxygen saturations were monitored continuously. The  ?                          Endoscope was introduced through the mouth, and  ?                          advanced to the third  part of duodenum. The upper  ?                          GI endoscopy was accomplished without difficulty.  ?                          The patient tolerated the procedure well. ?Scope In: ?Scope Out: ?Findings:                 The esophagus was normal. ?                          The stomach was normal, save hiatal hernia. ?                          The examined duodenum was normal. Biopsies for  ?                          histology were taken with a cold forceps for  ?                          evaluation of celiac disease. ?                          The cardia and gastric fundus were normal on  ?                          retroflexion. ?Complications:            No immediate complications. ?Estimated Blood Loss:     Estimated blood loss: none. ?Impression:               - Normal esophagus. ?                          - Normal stomach. ?                          - Normal examined duodenum. Biopsied. ?Recommendation:           - Patient has a contact number available for  ?  emergencies. The signs and symptoms of potential  ?                          delayed complications were discussed with the  ?                          patient. Return to normal activities tomorrow.  ?                          Written discharge instructions were provided to the  ?                          patient. ?                          - Resume previous diet. ?                          - Continue present medications. ?                          - Await pathology results. We will communicate with  ?                          you with the results when available and reviewed. ?                          - Exercise and weight loss (for fatty liver) ?                          - GI office follow-up 1 year ?Docia Chuck. Henrene Pastor, MD ?11/25/2021 10:37:23 AM ?This report has been signed electronically. ?

## 2021-11-25 NOTE — Progress Notes (Signed)
Called to room to assist during endoscopic procedure.  Patient ID and intended procedure confirmed with present staff. Received instructions for my participation in the procedure from the performing physician.  

## 2021-11-25 NOTE — Patient Instructions (Signed)
Resume previous diet and medications. Awaiting pathology results. Exercise and weight loss for fatty liver. GI office follow up in 1 year. ? ?YOU HAD AN ENDOSCOPIC PROCEDURE TODAY AT [facility]:   Refer to the procedure report that was given to you for any specific questions about what was found during the examination.  If the procedure report does not answer your questions, please call your gastroenterologist to clarify.  If you requested that your care partner not be given the details of your procedure findings, then the procedure report has been included in a sealed envelope for you to review at your convenience later. ? ?YOU SHOULD EXPECT: Some feelings of bloating in the abdomen. Passage of more gas than usual.  Walking can help get rid of the air that was put into your GI tract during the procedure and reduce the bloating. If you had a lower endoscopy (such as a colonoscopy or flexible sigmoidoscopy) you may notice spotting of blood in your stool or on the toilet paper. If you underwent a bowel prep for your procedure, you may not have a normal bowel movement for a few days. ? ?Please Note:  You might notice some irritation and congestion in your nose or some drainage.  This is from the oxygen used during your procedure.  There is no need for concern and it should clear up in a day or so. ? ?SYMPTOMS TO REPORT IMMEDIATELY: ? ?Following upper endoscopy (EGD) ? Vomiting of blood or coffee ground material ? New chest pain or pain under the shoulder blades ? Painful or persistently difficult swallowing ? New shortness of breath ? Fever of 100?F or higher ? Black, tarry-looking stools ? ?For urgent or emergent issues, a gastroenterologist can be reached at any hour by calling [phone]. ?Do not use MyChart messaging for urgent concerns.  ? ? ?DIET:  We do recommend a small meal at first, but then you may proceed to your regular diet.  Drink plenty of fluids but you should avoid alcoholic  beverages for 24 hours. ? ?ACTIVITY:  You should plan to take it easy for the rest of today and you should NOT DRIVE or use heavy machinery until tomorrow (because of the sedation medicines used during the test).   ? ?FOLLOW UP: ?Our staff will call the number listed on your records 48-72 hours following your procedure to check on you and address any questions or concerns that you may have regarding the information given to you following your procedure. If we do not reach you, we will leave a message.  We will attempt to reach you two times.  During this call, we will ask if you have developed any symptoms of COVID 19. If you develop any symptoms (ie: fever, flu-like symptoms, shortness of breath, cough etc.) before then, please call [phone].  If you test positive for Covid 19 in the 2 weeks post procedure, please call and report this information to [place].   ? ?If any biopsies were taken you will be contacted by phone or by letter within the next 1-3 weeks.  Please call us at [phone] if you have not heard about the biopsies in 3 weeks.  ? ? ?SIGNATURES/CONFIDENTIALITY: ?You and/or your care partner have signed paperwork which will be entered into your electronic medical record.  These signatures attest to the fact that that the information above on your After Visit Summary has been reviewed and is understood.  Full responsibility of the confidentiality of this  discharge information lies with you and/or your care-partner.  ?

## 2021-11-27 ENCOUNTER — Encounter: Payer: Self-pay | Admitting: Physician Assistant

## 2021-11-27 ENCOUNTER — Encounter: Payer: Self-pay | Admitting: Internal Medicine

## 2021-11-27 ENCOUNTER — Telehealth: Payer: Self-pay | Admitting: *Deleted

## 2021-11-27 ENCOUNTER — Other Ambulatory Visit: Payer: Self-pay

## 2021-11-27 DIAGNOSIS — R1013 Epigastric pain: Secondary | ICD-10-CM

## 2021-11-27 DIAGNOSIS — K909 Intestinal malabsorption, unspecified: Secondary | ICD-10-CM

## 2021-11-27 NOTE — Telephone Encounter (Signed)
?  Follow up Call- ? ? ?  11/25/2021  ?  9:35 AM 07/22/2020  ? 10:38 AM  ?Call back number  ?Post procedure Call Back phone  # [phone]  ?Permission to leave phone message Yes Yes  ?  ? ?Patient questions: ? ?Do you have a fever, pain , or abdominal swelling? No. ?Pain Score  0 * ? ?Have you tolerated food without any problems? Yes.   ? ?Have you been able to return to your normal activities? Yes.   ? ?Do you have any questions about your discharge instructions: ?Diet   No. ?Medications  No. ?Follow up visit  No. ? ?Do you have questions or concerns about your Care? No. ? ?Actions: ?* If pain score is 4 or above: ?No action needed, pain <4. ? ? ?

## 2021-11-28 ENCOUNTER — Telehealth: Payer: Self-pay | Admitting: Internal Medicine

## 2021-11-28 NOTE — Telephone Encounter (Signed)
See result note.  

## 2021-11-28 NOTE — Telephone Encounter (Signed)
Patient returned your phone call.  Please call. ?

## 2021-12-16 ENCOUNTER — Other Ambulatory Visit: Payer: Self-pay | Admitting: Physician Assistant

## 2021-12-16 DIAGNOSIS — Z1231 Encounter for screening mammogram for malignant neoplasm of breast: Secondary | ICD-10-CM

## 2021-12-18 ENCOUNTER — Ambulatory Visit
Admission: RE | Admit: 2021-12-18 | Discharge: 2021-12-18 | Disposition: A | Discharge status: Home / Self Care | Payer: Managed Care, Other (non HMO) | Source: Ambulatory Visit | Attending: Physician Assistant | Admitting: Physician Assistant

## 2021-12-18 DIAGNOSIS — Z1231 Encounter for screening mammogram for malignant neoplasm of breast: Secondary | ICD-10-CM | POA: Diagnosis present

## 2022-01-09 ENCOUNTER — Other Ambulatory Visit: Payer: Self-pay | Admitting: Physician Assistant

## 2022-01-09 DIAGNOSIS — E785 Hyperlipidemia, unspecified: Secondary | ICD-10-CM

## 2022-01-19 ENCOUNTER — Encounter: Payer: Self-pay | Admitting: Physician Assistant

## 2022-01-28 ENCOUNTER — Ambulatory Visit (INDEPENDENT_AMBULATORY_CARE_PROVIDER_SITE_OTHER): Payer: Managed Care, Other (non HMO) | Admitting: Internal Medicine

## 2022-01-28 ENCOUNTER — Encounter: Payer: Self-pay | Admitting: Internal Medicine

## 2022-01-28 VITALS — BP 114/84 | HR 64 | Ht 62.5 in | Wt 219.4 lb

## 2022-01-28 DIAGNOSIS — K900 Celiac disease: Secondary | ICD-10-CM | POA: Diagnosis not present

## 2022-01-28 DIAGNOSIS — R768 Other specified abnormal immunological findings in serum: Secondary | ICD-10-CM | POA: Diagnosis not present

## 2022-01-28 DIAGNOSIS — R748 Abnormal levels of other serum enzymes: Secondary | ICD-10-CM

## 2022-01-28 NOTE — Patient Instructions (Signed)
If you are age 54 or older, your body mass index should be between 23-30. Your Body mass index is 39.48 kg/m. If this is out of the aforementioned range listed, please consider follow up with your Primary Care Provider.  If you are age 10 or younger, your body mass index should be between 19-25. Your Body mass index is 39.48 kg/m. If this is out of the aformentioned range listed, please consider follow up with your Primary Care Provider.   ________________________________________________________  The [facility] providers would like to encourage you to use [facility] to communicate with providers for non-urgent requests or questions.  Due to long hold times on the telephone, sending your provider a message by [facility] may be a faster and more efficient way to get a response.  Please allow 48 business hours for a response.  Please remember that this is for non-urgent requests.  _______________________________________________________  Your provider has requested that you go to the basement level for lab work before your appointment in 6 months. Press "B" on the elevator. The lab is located at the first door on the left as you exit the elevator.  Please follow up in 6 months

## 2022-01-28 NOTE — Progress Notes (Signed)
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  She was evaluated in the office by the GI physician assistant October 09, 2021 regarding elevated alkaline phosphatase and frequent fleeting epigastric discomfort.  See that dictation for details.  She had a myriad of blood work performed.  Alkaline phosphatase was mildly elevated at 145.  Ferritin level was low normal at 15.8 with iron saturation 19.8%.  Hemoglobin was normal.  Testing for celiac disease was positive with an elevated tissue transglutaminase antibody IgA at 22.8.  Abdominal ultrasound revealed fatty liver.  Subsequently, upper endoscopy was performed November 25, 2021.  Examination was grossly normal.  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  She was notified and set up to see a nutritionist regarding gluten-free diet.  She presents today for follow-up.  She has been avoiding gluten to the best of her ability.  She reports about 1 pound weight loss per week.  She comes prepared with multiple appropriate questions including issues with fatigue, bloating, energy levels, weight, bone density, vitamin deficiencies including B12 and D.  She does have a follow-up appoint with her nutritionist.  She is checking with her pharmacist to make sure her drugs are gluten-free.  We reviewed her work-up today.  She did have screening colonoscopy December 2021 with diminutive adenoma.  REVIEW OF SYSTEMS:  All non-GI ROS negative unless otherwise stated in the HPI except for sleeping problems, urinary leakage, arthritis, back pain  Past Medical History:  Diagnosis Date   Allergy    seasonal allergies   Asthma    adult onset (1998)   GERD (gastroesophageal reflux disease)    for PRN use   History of MRSA infection    Miscarriage 2013   multiple    Paresthesia of lower extremity    Peripheral neuropathy 2019   left  foot   Plantar fasciitis of left foot    Thoracic outlet syndrome    TIA (transient ischemic attack) 09/11/2021   Urinary incontinence     Past Surgical History:  Procedure Laterality Date   DILATION AND CURETTAGE OF UTERUS  2011   LYMPHADENECTOMY Left 1979   WISDOM TOOTH EXTRACTION      Social History Denise Gamble  reports that she has never smoked. She has never used smokeless tobacco. She reports that she does not drink alcohol and does not use drugs.  family history includes Alcohol abuse in her brother; Alzheimer's disease in her maternal aunt; Brain cancer (age of onset: 73) in her sister; Colon cancer (age of onset: 54) in her father; Colon polyps in her brother and mother; Colon polyps (age of onset: 52) in her brother; Colon polyps (age of onset: 32) in her father; Coronary artery disease in her brother; Dementia in her mother; Diabetes in her brother; Diverticulitis in her mother; Heart attack in her maternal grandfather; Heart disease in her brother; Heart disease (age of onset: 84) in her mother; Hyperlipidemia in her father and sister; Hypertension in her brother, father, sister, sister, and sister; Leukemia in her father; Lung cancer (age of onset: 8) in her sister; Osteoporosis in her maternal grandmother, paternal grandmother, and sister; Prostate cancer in her brother; Skin cancer in her father; Stroke (age of onset: 24) in her sister.  Allergies  Allergen Reactions   Pollen Extract Hives    All tree pollens and dust mites   Latex Hives   Gluten  Meal    Other     Seasonal:Grass, most trees, ragweed       PHYSICAL EXAMINATION: Vital signs: BP 114/84 (BP Location: Left Arm, Patient Position: Sitting, Cuff Size: Large)   Pulse 64   Ht 5' 2.5" (1.588 m) Comment: height measured without shoes  Wt 219 lb 6 oz (99.5 kg)   LMP 03/29/2019   BMI 39.48 kg/m   Constitutional: Overweight but generally well-appearing, no acute distress Psychiatric: Pleasant, alert and  oriented x3, cooperative Eyes: Anicteric Abdomen: Not reexamined Extremities: no gross abnormalities Skin: no obvious lesions on visible extremities Neuro: No gross deficits  ASSESSMENT:  1.  Celiac disease.  Baseline tissue transglutaminase antibody IgA 22.8.  Upper endoscopy April 2023 with  MARSH IIIA lesion.  Now on gluten-free diet. 2.  Mild fluctuating elevation of alkaline phosphatase.  This is either due to fatty liver or possibly celiac. 3.  Obesity 4.  General medical problems 5.  History of adenomatous colon polyp December 2021. 6.  Family history of colon cancer in parent   PLAN:  1.  Detailed discussion on celiac disease.  Multiple questions answered to patient's satisfaction. 2.  Strict gluten-free diet 3.  Continue working with the nutritionist and pharmacist 4.  Graduated exercise and weight loss 5.  Repeat tissue transglutaminase antibody IgA in 6 months 6.  Office follow-up in 6 months 7.  Surveillance colonoscopy around December 2026 Total time of 30 minutes was spent preparing to see the patient, obtaining history, performing medically appropriate physical exam, counseling and educating patient regarding the above listed issues, ordering blood work and clinical follow-up, and documenting clinical information in the health record

## 2022-02-02 ENCOUNTER — Other Ambulatory Visit: Payer: Self-pay | Admitting: Physician Assistant

## 2022-02-02 DIAGNOSIS — J302 Other seasonal allergic rhinitis: Secondary | ICD-10-CM

## 2022-02-12 ENCOUNTER — Other Ambulatory Visit: Payer: Self-pay | Admitting: Physician Assistant

## 2022-02-12 DIAGNOSIS — E785 Hyperlipidemia, unspecified: Secondary | ICD-10-CM

## 2022-02-12 DIAGNOSIS — E038 Other specified hypothyroidism: Secondary | ICD-10-CM

## 2022-02-12 DIAGNOSIS — Z0000 Encounter for general adult medical examination without abnormal findings: Secondary | ICD-10-CM

## 2022-02-12 DIAGNOSIS — R748 Abnormal levels of other serum enzymes: Secondary | ICD-10-CM

## 2022-02-19 ENCOUNTER — Other Ambulatory Visit: Payer: Managed Care, Other (non HMO)

## 2022-02-19 DIAGNOSIS — R748 Abnormal levels of other serum enzymes: Secondary | ICD-10-CM

## 2022-02-19 DIAGNOSIS — E038 Other specified hypothyroidism: Secondary | ICD-10-CM

## 2022-02-19 DIAGNOSIS — E785 Hyperlipidemia, unspecified: Secondary | ICD-10-CM

## 2022-02-19 DIAGNOSIS — Z0000 Encounter for general adult medical examination without abnormal findings: Secondary | ICD-10-CM

## 2022-02-20 LAB — COMPREHENSIVE METABOLIC PANEL
ALT: 15 IU/L (ref 0–32)
AST: 14 IU/L (ref 0–40)
Albumin/Globulin Ratio: 1.4 (ref 1.2–2.2)
Albumin: 3.9 g/dL (ref 3.8–4.9)
Alkaline Phosphatase: 144 IU/L — ABNORMAL HIGH (ref 44–121)
BUN/Creatinine Ratio: 15 (ref 9–23)
BUN: 11 mg/dL (ref 6–24)
Bilirubin Total: 0.6 mg/dL (ref 0.0–1.2)
CO2: 25 mmol/L (ref 20–29)
Calcium: 9.1 mg/dL (ref 8.7–10.2)
Chloride: 104 mmol/L (ref 96–106)
Creatinine, Ser: 0.71 mg/dL (ref 0.57–1.00)
Globulin, Total: 2.8 g/dL (ref 1.5–4.5)
Glucose: 89 mg/dL (ref 70–99)
Potassium: 4.6 mmol/L (ref 3.5–5.2)
Sodium: 141 mmol/L (ref 134–144)
Total Protein: 6.7 g/dL (ref 6.0–8.5)
eGFR: 102 mL/min/{1.73_m2} (ref >59)

## 2022-02-20 LAB — HEMOGLOBIN A1C
Est. average glucose Bld gHb Est-mCnc: 111 mg/dL
Hgb A1c MFr Bld: 5.5 % (ref 4.8–5.6)

## 2022-02-20 LAB — LIPID PANEL
Chol/HDL Ratio: 2.9 ratio (ref 0.0–4.4)
Cholesterol, Total: 117 mg/dL (ref 100–199)
HDL: 41 mg/dL (ref >39)
LDL Chol Calc (NIH): 58 mg/dL (ref 0–99)
Triglycerides: 95 mg/dL (ref 0–149)
VLDL Cholesterol Cal: 18 mg/dL (ref 5–40)

## 2022-02-20 LAB — CBC
Hematocrit: 40.5 % (ref 34.0–46.6)
Hemoglobin: 13.4 g/dL (ref 11.1–15.9)
MCH: 31.5 pg (ref 26.6–33.0)
MCHC: 33.1 g/dL (ref 31.5–35.7)
MCV: 95 fL (ref 79–97)
Platelets: 247 10*3/uL (ref 150–450)
RBC: 4.26 x10E6/uL (ref 3.77–5.28)
RDW: 12.7 % (ref 11.7–15.4)
WBC: 6.3 10*3/uL (ref 3.4–10.8)

## 2022-02-20 LAB — TSH: TSH: 3.77 u[IU]/mL (ref 0.450–4.500)

## 2022-02-20 NOTE — Patient Instructions (Signed)
Preventive Care 54-54 Years Old, Female Preventive care refers to lifestyle choices and visits with your health care provider that can promote health and wellness. Preventive care visits are also called wellness exams. What can I expect for my preventive care visit? Counseling Your health care provider may ask you questions about your: Medical history, including: Past medical problems. Family medical history. Pregnancy history. Current health, including: Menstrual cycle. Method of birth control. Emotional well-being. Home life and relationship well-being. Sexual activity and sexual health. Lifestyle, including: Alcohol, nicotine or tobacco, and drug use. Access to firearms. Diet, exercise, and sleep habits. Work and work environment. Sunscreen use. Safety issues such as seatbelt and bike helmet use. Physical exam Your health care provider will check your: Height and weight. These may be used to calculate your BMI (body mass index). BMI is a measurement that tells if you are at a healthy weight. Waist circumference. This measures the distance around your waistline. This measurement also tells if you are at a healthy weight and may help predict your risk of certain diseases, such as type 2 diabetes and high blood pressure. Heart rate and blood pressure. Body temperature. Skin for abnormal spots. What immunizations do I need?  Vaccines are usually given at various ages, according to a schedule. Your health care provider will recommend vaccines for you based on your age, medical history, and lifestyle or other factors, such as travel or where you work. What tests do I need? Screening Your health care provider may recommend screening tests for certain conditions. This may include: Lipid and cholesterol levels. Diabetes screening. This is done by checking your blood sugar (glucose) after you have not eaten for a while (fasting). Pelvic exam and Pap test. Hepatitis B test. Hepatitis C  test. HIV (human immunodeficiency virus) test. STI (sexually transmitted infection) testing, if you are at risk. Lung cancer screening. Colorectal cancer screening. Mammogram. Talk with your health care provider about when you should start having regular mammograms. This may depend on whether you have a family history of breast cancer. BRCA-related cancer screening. This may be done if you have a family history of breast, ovarian, tubal, or peritoneal cancers. Bone density scan. This is done to screen for osteoporosis. Talk with your health care provider about your test results, treatment options, and if necessary, the need for more tests. Follow these instructions at home: Eating and drinking  Eat a diet that includes fresh fruits and vegetables, whole grains, lean protein, and low-fat dairy products. Take vitamin and mineral supplements as recommended by your health care provider. Do not drink alcohol if: Your health care provider tells you not to drink. You are pregnant, may be pregnant, or are planning to become pregnant. If you drink alcohol: Limit how much you have to 0-1 drink a day. Know how much alcohol is in your drink. In the [place] one drink equals one 12 oz bottle of beer (355 mL), one 5 oz glass of wine (148 mL), or one 1 oz glass of hard liquor (44 mL). Lifestyle Brush your teeth every morning and night with fluoride toothpaste. Floss one time each day. Exercise for at least 30 minutes 5 or more days each week. Do not use any products that contain nicotine or tobacco. These products include cigarettes, chewing tobacco, and vaping devices, such as e-cigarettes. If you need help quitting, ask your health care provider. Do not use drugs. If you are sexually active, practice safe sex. Use a condom or other form of protection to   prevent STIs. If you do not wish to become pregnant, use a form of birth control. If you plan to become pregnant, see your health care provider for a  prepregnancy visit. Take aspirin only as told by your health care provider. Make sure that you understand how much to take and what form to take. Work with your health care provider to find out whether it is safe and beneficial for you to take aspirin daily. Find healthy ways to manage stress, such as: Meditation, yoga, or listening to music. Journaling. Talking to a trusted person. Spending time with friends and family. Minimize exposure to UV radiation to reduce your risk of skin cancer. Safety Always wear your seat belt while driving or riding in a vehicle. Do not drive: If you have been drinking alcohol. Do not ride with someone who has been drinking. When you are tired or distracted. While texting. If you have been using any mind-altering substances or drugs. Wear a helmet and other protective equipment during sports activities. If you have firearms in your house, make sure you follow all gun safety procedures. Seek help if you have been physically or sexually abused. What's next? Visit your health care provider once a year for an annual wellness visit. Ask your health care provider how often you should have your eyes and teeth checked. Stay up to date on all vaccines. This information is not intended to replace advice given to you by your health care provider. Make sure you discuss any questions you have with your health care provider. Document Revised: 01/22/2021 Document Reviewed: 01/22/2021 Elsevier Patient Education  Denise Gamble.

## 2022-02-23 ENCOUNTER — Encounter: Payer: Self-pay | Admitting: Physician Assistant

## 2022-02-23 ENCOUNTER — Ambulatory Visit (INDEPENDENT_AMBULATORY_CARE_PROVIDER_SITE_OTHER): Payer: Managed Care, Other (non HMO) | Admitting: Physician Assistant

## 2022-02-23 VITALS — BP 120/84 | HR 64 | Temp 97.7°F | Ht 63.0 in | Wt 216.0 lb

## 2022-02-23 DIAGNOSIS — E785 Hyperlipidemia, unspecified: Secondary | ICD-10-CM

## 2022-02-23 DIAGNOSIS — Z0000 Encounter for general adult medical examination without abnormal findings: Secondary | ICD-10-CM

## 2022-02-23 DIAGNOSIS — E038 Other specified hypothyroidism: Secondary | ICD-10-CM

## 2022-02-23 NOTE — Progress Notes (Signed)
Complete physical exam   Patient: Denise Gamble   DOB: [birth year]   56 y.o. Female  MRN: [id number] Visit Date: 02/23/2022   Chief Complaint  Patient presents with   Annual Exam   Subjective    Tanya Crothers is a 56 y.o. female who presents today for a complete physical exam.  She reports consuming a low fat gluten-free diet.  Walking and outdoor activities as tolerated.  She generally feels fairly well. She does not have additional problems to discuss today.     Past Medical History:  Diagnosis Date   Allergy    seasonal allergies   Asthma    adult onset (1998)   GERD (gastroesophageal reflux disease)    for PRN use   History of MRSA infection    Miscarriage 2013   multiple    Paresthesia of lower extremity    Peripheral neuropathy 2019   left foot   Plantar fasciitis of left foot    Thoracic outlet syndrome    TIA (transient ischemic attack) 09/11/2021   Urinary incontinence    Past Surgical History:  Procedure Laterality Date   DILATION AND CURETTAGE OF UTERUS  2011   LYMPHADENECTOMY Left 1979   WISDOM TOOTH EXTRACTION     Social History   Socioeconomic History   Marital status: Married    Spouse name: Eddie Dibbles   Number of children: 0   Years of education: Not on file   Highest education level: Master's degree (e.g., MA, MS, MEng, MEd, MSW, MBA)  Occupational History   Not on file  Tobacco Use   Smoking status: Never   Smokeless tobacco: Never  Vaping Use   Vaping Use: Never used  Substance and Sexual Activity   Alcohol use: No    Alcohol/week: 0.0 standard drinks of alcohol    Comment: quit 2010   Drug use: No   Sexual activity: Yes    Comment: perimenopausal  Other Topics Concern   Not on file  Social History Narrative   Works for [employer] to [employer]   Married   No caffeine   Right handed            Social Determinants of Health   Financial Resource Strain: Not on file  Food Insecurity: Not on file   Transportation Needs: Not on file  Physical Activity: Not on file  Stress: Not on file  Social Connections: Not on file  Intimate Partner Violence: Not on file     Medications: Outpatient Medications Prior to Visit  Medication Sig   ADVAIR HFA 45-21 MCG/ACT inhaler USE 2 INHALATIONS TWICE A DAY (Patient taking differently: 2 puffs 2 (two) times daily as needed (shortness of breath).)   albuterol (VENTOLIN HFA) 108 (90 Base) MCG/ACT inhaler USE 2 INHALATIONS EVERY 4 HOURS AS NEEDED FOR WHEEZING OR SHORTNESS OF BREATH (Patient taking differently: 2 puffs every 4 (four) hours as needed for wheezing or shortness of breath.)   Ascorbic Acid (VITAMIN C) 1000 MG tablet Take 1 tablet by mouth daily.   aspirin EC 81 MG EC tablet Take 1 tablet (81 mg total) by mouth daily.   atorvastatin (LIPITOR) 40 MG tablet TAKE 1 TABLET (40 MG TOTAL) BY MOUTH DAILY.   cetirizine (ZYRTEC) 10 MG tablet Take 10 mg by mouth daily.   cholecalciferol (VITAMIN D3) 25 MCG (1000 UNIT) tablet Take 1,000 Units by mouth daily.   diclofenac Sodium (VOLTAREN) 1 % GEL Apply 2 g topically 4 (  four) times daily.   Magnesium Citrate (CITROMA PO) Take 1,000 mg by mouth daily. liquid   montelukast (SINGULAIR) 10 MG tablet TAKE 1 TABLET AT BEDTIME   Omega-3 Fatty Acids (EQL OMEGA 3 FISH OIL) 1000 MG CPDR Take 1,000 mg by mouth daily.   vitamin B-12 (CYANOCOBALAMIN) 1000 MCG tablet Take 1,000 mcg by mouth daily.   No facility-administered medications prior to visit.    Review of Systems Review of Systems:  A fourteen system review of systems was performed and found to be positive as per HPI.  Last CBC Lab Results  Component Value Date   WBC 6.3 02/19/2022   HGB 13.4 02/19/2022   HCT 40.5 02/19/2022   MCV 95 02/19/2022   MCH 31.5 02/19/2022   RDW 12.7 02/19/2022   PLT 247 20/80/2233   Last metabolic panel Lab Results  Component Value Date   GLUCOSE 89 02/19/2022   NA 141 02/19/2022   K 4.6 02/19/2022   CL 104  02/19/2022   CO2 25 02/19/2022   BUN 11 02/19/2022   CREATININE 0.71 02/19/2022   EGFR 102 02/19/2022   CALCIUM 9.1 02/19/2022   PHOS 2.9 (L) 10/27/2019   PROT 6.7 02/19/2022   ALBUMIN 3.9 02/19/2022   LABGLOB 2.8 02/19/2022   AGRATIO 1.4 02/19/2022   BILITOT 0.6 02/19/2022   ALKPHOS 144 (H) 02/19/2022   AST 14 02/19/2022   ALT 15 02/19/2022   ANIONGAP 8 11/07/2021   Last lipids Lab Results  Component Value Date   CHOL 117 02/19/2022   HDL 41 02/19/2022   LDLCALC 58 02/19/2022   TRIG 95 02/19/2022   CHOLHDL 2.9 02/19/2022   Last hemoglobin A1c Lab Results  Component Value Date   HGBA1C 5.5 02/19/2022   Last thyroid functions Lab Results  Component Value Date   TSH 3.770 02/19/2022   T3TOTAL 106 02/12/2021   Last vitamin D Lab Results  Component Value Date   VD25OH 53.1 12/16/2020    Objective     BP 120/84   Pulse 64   Temp 97.7 F (36.5 C)   Ht _0  (1.6 m)   Wt 216 lb (98 kg)   LMP 03/29/2019   SpO2 97%   BMI 38.26 kg/m  BP Readings from Last 3 Encounters:  02/23/22 120/84  01/28/22 114/84  11/25/21 104/73   Wt Readings from Last 3 Encounters:  02/23/22 216 lb (98 kg)  01/28/22 219 lb 6 oz (99.5 kg)  11/25/21 216 lb (98 kg)    Physical Exam   General Appearance:    Alert, cooperative, in no acute distress, appears stated age   Head:    Normocephalic, without obvious abnormality, atraumatic  Eyes:    PERRL, conjunctiva/corneas clear, EOM's intact, fundi    benign, both eyes  Ears:    Normal TM's and external ear canals, both ears  Nose:   Nares normal, septum midline, mucosa normal, no drainage    or sinus tenderness  Throat:   Lips, mucosa, and tongue normal; teeth and gums normal  Neck:   Supple, symmetrical, trachea midline, no adenopathy;    thyroid:  no enlargement/tenderness/nodules; no JVD  Back:     Symmetric, no curvature, ROM normal, no CVA tenderness  Lungs:     Clear to auscultation bilaterally, respirations unlabored   Chest Wall:    No tenderness or deformity   Heart:    Normal heart rate. Normal rhythm. No murmurs, rubs, or gallops.   Breast Exam:  deferred  Abdomen:     Soft, non-tender, bowel sounds active all four quadrants,    no masses, no organomegaly  Pelvic:    deferred  Extremities:   All extremities are intact. No cyanosis or edema  Pulses:   2+ and symmetric all extremities  Skin:   Skin color, texture, turgor normal, no rashes or lesions  Lymph nodes:   Cervical and supraclavicular nodes normal  Neurologic:   CNII-XII grossly intact.     Last depression screening scores    02/23/2022    9:16 AM 11/20/2021    8:22 AM 09/19/2021   10:36 AM  PHQ 2/9 Scores  PHQ - 2 Score 1 0 0  PHQ- 9 Score 1 0 3   Last fall risk screening    02/23/2022    9:16 AM  Fall Risk   Falls in the past year? 1  Number falls in past yr: 1  Injury with Fall? 1  Risk for fall due to : History of fall(s)  Follow up Falls evaluation completed     No results found for any visits on 02/23/22.  Assessment & Plan    Routine Health Maintenance and Physical Exam  Exercise Activities and Dietary recommendations -Continue heart healthy diet low in fat and carbohydrates. Recommend moderate exercise 150 mins/wk.  Immunization History  Administered Date(s) Administered   Influenza,inj,Quad PF,6+ Mos 06/09/2021   Influenza-Unspecified 04/08/2017, 05/26/2018   Janssen (J&J) SARS-COV-2 Vaccination 11/18/2019   MODERNA COVID-19 SARS-COV-2 PEDS BIVALENT BOOSTER 6Y-11Y 06/09/2021   Pneumococcal Polysaccharide-23 12/14/2016   Tdap 01/01/2020   Zoster Recombinat (Shingrix) 12/13/2019    Health Maintenance  Topic Date Due   PAP SMEAR-Modifier  12/15/2019   Zoster Vaccines- Shingrix (2 of 2) 02/07/2020   INFLUENZA VACCINE  03/10/2022   MAMMOGRAM  12/19/2023   COLONOSCOPY (Pts 45-40yr Insurance coverage will need to be confirmed)  07/22/2025   TETANUS/TDAP  12/31/2029   COVID-19 Vaccine  Completed    Hepatitis C Screening  Completed   HIV Screening  Completed   HPV VACCINES  Aged Out    Discussed health benefits of physical activity, and encouraged her to engage in regular exercise appropriate for her age and condition.  Problem List Items Addressed This Visit       Other   Hyperlipidemia   Other Visit Diagnoses     Healthcare maintenance    -  Primary   Subclinical hypothyroidism          Discussed with patient most recent lab results which are essentially within normal limits or stable from prior. Followed by OB/GYN, scheduled for female exam 03/04/22.  UTD mammogram, colonoscopy, Tdap. Continue sessions with nutritionist.    Return in about 6 months (around 08/26/2022) for HLD, thyroid; nurse visit for second shingles vaccine.       MLorrene Reid PA-C  [phone](phone) [phone](fax)  [place]

## 2022-02-25 NOTE — Progress Notes (Deleted)
56 y.o. G88P0040 [name] here for annual exam.    PCP:  [name], PA-C   Patient's last menstrual period was 03/29/2019.           Sexually active: {yes no:314532}  The current method of family planning is {contraception:315051}.    Exercising: {yes no:314532}  {types:19826} Smoker:  no  Health Maintenance: Pap:  12-14-16 Neg:Neg HR HPV.   History of abnormal Pap:  Thinks she had HPV on a pap in 2007.  All follow up was normal, and she did regular paps.  MMG: 12-18-21 Neg/BiRads1 Colonoscopy:  ***NEVER BMD:   n/a  Result  n/a TDaP:  01-01-20 Gardasil:   no HIV: 09-11-21 NR Hep C: 10-09-21 Neg Screening Labs:  Hb today: ***, Urine today: ***   reports that she has never smoked. She has never used smokeless tobacco. She reports that she does not drink alcohol and does not use drugs.  Past Medical History:  Diagnosis Date   Allergy    seasonal allergies   Asthma    adult onset (1998)   GERD (gastroesophageal reflux disease)    for PRN use   History of MRSA infection    Miscarriage 2013   multiple    Paresthesia of lower extremity    Peripheral neuropathy 2019   left foot   Plantar fasciitis of left foot    Thoracic outlet syndrome    TIA (transient ischemic attack) 09/11/2021   Urinary incontinence     Past Surgical History:  Procedure Laterality Date   DILATION AND CURETTAGE OF UTERUS  2011   LYMPHADENECTOMY Left 1979   WISDOM TOOTH EXTRACTION      Current Outpatient Medications  Medication Sig Dispense Refill   ADVAIR HFA 45-21 MCG/ACT inhaler USE 2 INHALATIONS TWICE A DAY (Patient taking differently: 2 puffs 2 (two) times daily as needed (shortness of breath).) 12 g 11   albuterol (VENTOLIN HFA) 108 (90 Base) MCG/ACT inhaler USE 2 INHALATIONS EVERY 4 HOURS AS NEEDED FOR WHEEZING OR SHORTNESS OF BREATH (Patient taking differently: 2 puffs every 4 (four) hours as needed for wheezing or shortness of breath.) 17 g 1   Ascorbic Acid (VITAMIN C) 1000  MG tablet Take 1 tablet by mouth daily.     aspirin EC 81 MG EC tablet Take 1 tablet (81 mg total) by mouth daily. 30 tablet 1   atorvastatin (LIPITOR) 40 MG tablet TAKE 1 TABLET (40 MG TOTAL) BY MOUTH DAILY. 30 tablet 1   cetirizine (ZYRTEC) 10 MG tablet Take 10 mg by mouth daily.     cholecalciferol (VITAMIN D3) 25 MCG (1000 UNIT) tablet Take 1,000 Units by mouth daily.     diclofenac Sodium (VOLTAREN) 1 % GEL Apply 2 g topically 4 (four) times daily.     Magnesium Citrate (CITROMA PO) Take 1,000 mg by mouth daily. liquid     montelukast (SINGULAIR) 10 MG tablet TAKE 1 TABLET AT BEDTIME 90 tablet 0   Omega-3 Fatty Acids (EQL OMEGA 3 FISH OIL) 1000 MG CPDR Take 1,000 mg by mouth daily.     vitamin B-12 (CYANOCOBALAMIN) 1000 MCG tablet Take 1,000 mcg by mouth daily.     No current facility-administered medications for this visit.    Family History  Problem Relation Age of Onset   Heart disease Mother 32       epstein anolmay    Dementia Mother    Colon polyps Mother    Diverticulitis Mother    Colon cancer  Father 69   Skin cancer Father        ? thinks melanoma?    Leukemia Father        AAL   Hyperlipidemia Father    Hypertension Father    Colon polyps Father 38   Hyperlipidemia Sister    Hypertension Sister    Stroke Sister 41   Brain cancer Sister 62       mets from lung   Lung cancer Sister 25       mets to brain-then stroke   Hypertension Sister    Osteoporosis Sister    Hypertension Sister    Heart disease Brother    Alcohol abuse Brother    Diabetes Brother    Hypertension Brother    Prostate cancer Brother    Coronary artery disease Brother    Colon polyps Brother    Colon polyps Brother 59   Osteoporosis Maternal Grandmother    Heart attack Maternal Grandfather    Osteoporosis Paternal Grandmother    Alzheimer's disease Maternal Aunt    Breast cancer Neg Hx    Esophageal cancer Neg Hx    Stomach cancer Neg Hx    Rectal cancer Neg Hx     Review of  Systems  Exam:   LMP 03/29/2019     General appearance: alert, cooperative and appears stated age Head: normocephalic, without obvious abnormality, atraumatic Neck: no adenopathy, supple, symmetrical, trachea midline and thyroid normal to inspection and palpation Lungs: clear to auscultation bilaterally Breasts: normal appearance, no masses or tenderness, No nipple retraction or dimpling, No nipple discharge or bleeding, No axillary adenopathy Heart: regular rate and rhythm Abdomen: soft, non-tender; no masses, no organomegaly Extremities: extremities normal, atraumatic, no cyanosis or edema Skin: skin color, texture, turgor normal. No rashes or lesions Lymph nodes: cervical, supraclavicular, and axillary nodes normal. Neurologic: grossly normal  Pelvic: External genitalia:  no lesions              No abnormal inguinal nodes palpated.              Urethra:  normal appearing urethra with no masses, tenderness or lesions              Bartholins and Skenes: normal                 Vagina: normal appearing vagina with normal color and discharge, no lesions              Cervix: no lesions              Pap taken: {yes no:314532} Bimanual Exam:  Uterus:  normal size, contour, position, consistency, mobility, non-tender              Adnexa: no mass, fullness, tenderness              Rectal exam: {yes no:314532}.  Confirms.              Anus:  normal sphincter tone, no lesions  Chaperone was present for exam:  ***  Assessment:   Well woman visit with gynecologic exam.   Plan: Mammogram screening discussed. Self breast awareness reviewed. Pap and HR HPV as above. Guidelines for Calcium, Vitamin D, regular exercise program including cardiovascular and weight bearing exercise.   Follow up annually and prn.   Additional counseling given.  {yes [id number]. _______ minutes face to face time of which over 50% was spent in counseling.    After visit summary provided.

## 2022-03-02 ENCOUNTER — Ambulatory Visit: Payer: Managed Care, Other (non HMO) | Admitting: Registered"

## 2022-03-02 ENCOUNTER — Encounter: Payer: Self-pay | Admitting: Internal Medicine

## 2022-03-02 NOTE — Progress Notes (Unsigned)
56 y.o. G77P0040 [name] here for annual exam.    PCP: [name] PA-C  Patient's last menstrual period was 03/29/2019.           Sexually active: {yes no:314532}  The current method of family planning is {contraception:315051}.    Exercising: {yes no:314532}  {types:19826} Smoker:  no  Health Maintenance: Pap:  12-14-16 Neg:Neg HR HPV.  History of abnormal Pap: Thinks she had HPV on a pap in 2007.  All follow up was normal, and she did regular paps.  MMG:  12-18-21 Neg/BiRads1 Colonoscopy:  ***NEVER BMD:   n/a  Result  n/a TDaP:  01-01-20 Gardasil:   no HIV: 09-11-21 NR Hep C: 10-09-21 Neg Screening Labs:  Hb today: ***, Urine today: ***   reports that she has never smoked. She has never used smokeless tobacco. She reports that she does not drink alcohol and does not use drugs.  Past Medical History:  Diagnosis Date   Allergy    seasonal allergies   Asthma    adult onset (1998)   GERD (gastroesophageal reflux disease)    for PRN use   History of MRSA infection    Miscarriage 2013   multiple    Paresthesia of lower extremity    Peripheral neuropathy 2019   left foot   Plantar fasciitis of left foot    Thoracic outlet syndrome    TIA (transient ischemic attack) 09/11/2021   Urinary incontinence     Past Surgical History:  Procedure Laterality Date   DILATION AND CURETTAGE OF UTERUS  2011   LYMPHADENECTOMY Left 1979   WISDOM TOOTH EXTRACTION      Current Outpatient Medications  Medication Sig Dispense Refill   ADVAIR HFA 45-21 MCG/ACT inhaler USE 2 INHALATIONS TWICE A DAY (Patient taking differently: 2 puffs 2 (two) times daily as needed (shortness of breath).) 12 g 11   albuterol (VENTOLIN HFA) 108 (90 Base) MCG/ACT inhaler USE 2 INHALATIONS EVERY 4 HOURS AS NEEDED FOR WHEEZING OR SHORTNESS OF BREATH (Patient taking differently: 2 puffs every 4 (four) hours as needed for wheezing or shortness of breath.) 17 g 1   Ascorbic Acid (VITAMIN C) 1000 MG  tablet Take 1 tablet by mouth daily.     aspirin EC 81 MG EC tablet Take 1 tablet (81 mg total) by mouth daily. 30 tablet 1   atorvastatin (LIPITOR) 40 MG tablet TAKE 1 TABLET (40 MG TOTAL) BY MOUTH DAILY. 30 tablet 1   cetirizine (ZYRTEC) 10 MG tablet Take 10 mg by mouth daily.     cholecalciferol (VITAMIN D3) 25 MCG (1000 UNIT) tablet Take 1,000 Units by mouth daily.     diclofenac Sodium (VOLTAREN) 1 % GEL Apply 2 g topically 4 (four) times daily.     Magnesium Citrate (CITROMA PO) Take 1,000 mg by mouth daily. liquid     montelukast (SINGULAIR) 10 MG tablet TAKE 1 TABLET AT BEDTIME 90 tablet 0   Omega-3 Fatty Acids (EQL OMEGA 3 FISH OIL) 1000 MG CPDR Take 1,000 mg by mouth daily.     vitamin B-12 (CYANOCOBALAMIN) 1000 MCG tablet Take 1,000 mcg by mouth daily.     No current facility-administered medications for this visit.    Family History  Problem Relation Age of Onset   Heart disease Mother 24       epstein anolmay    Dementia Mother    Colon polyps Mother    Diverticulitis Mother    Colon cancer Father 49  Skin cancer Father        ? thinks melanoma?    Leukemia Father        AAL   Hyperlipidemia Father    Hypertension Father    Colon polyps Father 23   Hyperlipidemia Sister    Hypertension Sister    Stroke Sister 21   Brain cancer Sister 32       mets from lung   Lung cancer Sister 36       mets to brain-then stroke   Hypertension Sister    Osteoporosis Sister    Hypertension Sister    Heart disease Brother    Alcohol abuse Brother    Diabetes Brother    Hypertension Brother    Prostate cancer Brother    Coronary artery disease Brother    Colon polyps Brother    Colon polyps Brother 20   Osteoporosis Maternal Grandmother    Heart attack Maternal Grandfather    Osteoporosis Paternal Grandmother    Alzheimer's disease Maternal Aunt    Breast cancer Neg Hx    Esophageal cancer Neg Hx    Stomach cancer Neg Hx    Rectal cancer Neg Hx     Review of  Systems  Exam:   LMP 03/29/2019     General appearance: alert, cooperative and appears stated age Head: normocephalic, without obvious abnormality, atraumatic Neck: no adenopathy, supple, symmetrical, trachea midline and thyroid normal to inspection and palpation Lungs: clear to auscultation bilaterally Breasts: normal appearance, no masses or tenderness, No nipple retraction or dimpling, No nipple discharge or bleeding, No axillary adenopathy Heart: regular rate and rhythm Abdomen: soft, non-tender; no masses, no organomegaly Extremities: extremities normal, atraumatic, no cyanosis or edema Skin: skin color, texture, turgor normal. No rashes or lesions Lymph nodes: cervical, supraclavicular, and axillary nodes normal. Neurologic: grossly normal  Pelvic: External genitalia:  no lesions              No abnormal inguinal nodes palpated.              Urethra:  normal appearing urethra with no masses, tenderness or lesions              Bartholins and Skenes: normal                 Vagina: normal appearing vagina with normal color and discharge, no lesions              Cervix: no lesions              Pap taken: {yes no:314532} Bimanual Exam:  Uterus:  normal size, contour, position, consistency, mobility, non-tender              Adnexa: no mass, fullness, tenderness              Rectal exam: {yes no:314532}.  Confirms.              Anus:  normal sphincter tone, no lesions  Chaperone was present for exam:  ***  Assessment:   Well woman visit with gynecologic exam.   Plan: Mammogram screening discussed. Self breast awareness reviewed. Pap and HR HPV as above. Guidelines for Calcium, Vitamin D, regular exercise program including cardiovascular and weight bearing exercise.   Follow up annually and prn.   Additional counseling given.  {yes [id number]. _______ minutes face to face time of which over 50% was spent in counseling.    After visit summary provided.

## 2022-03-03 ENCOUNTER — Ambulatory Visit: Payer: Managed Care, Other (non HMO)

## 2022-03-04 ENCOUNTER — Ambulatory Visit (INDEPENDENT_AMBULATORY_CARE_PROVIDER_SITE_OTHER): Payer: Managed Care, Other (non HMO) | Admitting: Obstetrics and Gynecology

## 2022-03-04 ENCOUNTER — Encounter: Payer: Self-pay | Admitting: Obstetrics and Gynecology

## 2022-03-04 ENCOUNTER — Ambulatory Visit: Payer: Managed Care, Other (non HMO) | Admitting: Obstetrics and Gynecology

## 2022-03-04 ENCOUNTER — Other Ambulatory Visit (HOSPITAL_COMMUNITY)
Admission: RE | Admit: 2022-03-04 | Discharge: 2022-03-04 | Disposition: A | Discharge status: Home / Self Care | Payer: Managed Care, Other (non HMO) | Source: Ambulatory Visit | Attending: Obstetrics and Gynecology | Admitting: Obstetrics and Gynecology

## 2022-03-04 VITALS — BP 122/80 | HR 69 | Ht 63.0 in | Wt 218.0 lb

## 2022-03-04 DIAGNOSIS — Z124 Encounter for screening for malignant neoplasm of cervix: Secondary | ICD-10-CM | POA: Diagnosis present

## 2022-03-04 DIAGNOSIS — N952 Postmenopausal atrophic vaginitis: Secondary | ICD-10-CM

## 2022-03-04 DIAGNOSIS — Z01419 Encounter for gynecological examination (general) (routine) without abnormal findings: Secondary | ICD-10-CM

## 2022-03-04 MED ORDER — NONFORMULARY OR COMPOUNDED ITEM: 11 refills | Status: DC

## 2022-03-04 NOTE — Patient Instructions (Signed)

## 2022-03-10 ENCOUNTER — Ambulatory Visit (INDEPENDENT_AMBULATORY_CARE_PROVIDER_SITE_OTHER): Payer: Managed Care, Other (non HMO) | Admitting: Physician Assistant

## 2022-03-10 VITALS — Ht 63.0 in | Wt 218.0 lb

## 2022-03-10 DIAGNOSIS — Z23 Encounter for immunization: Secondary | ICD-10-CM

## 2022-03-10 NOTE — Progress Notes (Signed)
Patient in office for shingles vaccine. Denies any illness or acute symptoms. Tolerated injection well. VIS given. AS, CMA

## 2022-03-11 ENCOUNTER — Other Ambulatory Visit: Payer: Self-pay | Admitting: Physician Assistant

## 2022-03-11 DIAGNOSIS — E785 Hyperlipidemia, unspecified: Secondary | ICD-10-CM

## 2022-03-11 LAB — CYTOLOGY - PAP
Comment: NEGATIVE
Diagnosis: NEGATIVE
Diagnosis: REACTIVE
High risk HPV: NEGATIVE

## 2022-04-03 ENCOUNTER — Encounter: Payer: Self-pay | Admitting: Physician Assistant

## 2022-05-01 ENCOUNTER — Other Ambulatory Visit: Payer: Self-pay | Admitting: Physician Assistant

## 2022-05-01 DIAGNOSIS — J302 Other seasonal allergic rhinitis: Secondary | ICD-10-CM

## 2022-05-12 ENCOUNTER — Other Ambulatory Visit: Payer: Self-pay | Admitting: Physician Assistant

## 2022-05-12 DIAGNOSIS — E785 Hyperlipidemia, unspecified: Secondary | ICD-10-CM

## 2022-06-02 ENCOUNTER — Telehealth: Payer: Managed Care, Other (non HMO) | Admitting: Physician Assistant

## 2022-06-02 DIAGNOSIS — U071 COVID-19: Secondary | ICD-10-CM

## 2022-06-02 DIAGNOSIS — J4520 Mild intermittent asthma, uncomplicated: Secondary | ICD-10-CM

## 2022-06-02 MED ORDER — ALBUTEROL SULFATE HFA 108 (90 BASE) MCG/ACT IN AERS: 1.0000 | INHALATION_SPRAY | Freq: Four times a day (QID) | RESPIRATORY_TRACT | 1 refills | Status: DC | PRN

## 2022-06-02 MED ORDER — BENZONATATE 100 MG PO CAPS: 100.0000 mg | ORAL_CAPSULE | Freq: Three times a day (TID) | ORAL | 0 refills | Status: DC | PRN

## 2022-06-02 MED ORDER — MOLNUPIRAVIR EUA 200MG CAPSULE: 4.0000 | ORAL_CAPSULE | Freq: Two times a day (BID) | ORAL | 0 refills | Status: AC

## 2022-06-02 NOTE — Progress Notes (Signed)
Virtual Visit Consent   Bailie Christenbury, you are scheduled for a virtual visit with a [facility] provider today. Just as with appointments in the office, your consent must be obtained to participate. Your consent will be active for this visit and any virtual visit you may have with one of our providers in the next 365 days. If you have a MyChart account, a copy of this consent can be sent to you electronically.  As this is a virtual visit, video technology does not allow for your provider to perform a traditional examination. This may limit your provider's ability to fully assess your condition. If your provider identifies any concerns that need to be evaluated in person or the need to arrange testing (such as labs, EKG, etc.), we will make arrangements to do so. Although advances in technology are sophisticated, we cannot ensure that it will always work on either your end or our end. If the connection with a video visit is poor, the visit may have to be switched to a telephone visit. With either a video or telephone visit, we are not always able to ensure that we have a secure connection.  By engaging in this virtual visit, you consent to the provision of healthcare and authorize for your insurance to be billed (if applicable) for the services provided during this visit. Depending on your insurance coverage, you may receive a charge related to this service.  I need to obtain your verbal consent now. Are you willing to proceed with your visit today? Kamrin Spath has provided verbal consent on 06/02/2022 for a virtual visit (video or telephone). Leeanne Rio, [place]  Date: 06/02/2022 8:21 AM  Virtual Visit via Video Note   I, Leeanne Rio, connected with  Deniz Eskridge  ([id number], September 26, 1965) on 06/02/22 at  8:15 AM EDT by a video-enabled telemedicine application and verified that I am speaking with the correct person using two identifiers.  Location: Patient: Virtual Visit  Location Patient: Home Provider: Virtual Visit Location Provider: Home Office   I discussed the limitations of evaluation and management by telemedicine and the availability of in person appointments. The patient expressed understanding and agreed to proceed.    History of Present Illness: Nelva Hauk is a 56 y.o. who identifies as a female who was assigned female at birth, and is being seen today for COVID-19. Notes symptoms starting Saturday night with sore throat, later developing a low-grade fever (Tmax 101), cough, mild windedness with exertion. Took COVID test this morning which was positive. Has history of asthma and her albuterol is out of date.    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   Morbid obesity ([place]) 10/24/2019   Plantar fascia syndrome 10/24/2019   Hyperlipidemia 10/24/2019   Neuropathy- b/l upper legs-   seeing ortho for this-[facility] 10/24/2019   Peripheral neuropathy 12/14/2016   Overweight 09/15/2016   Asthma 09/14/2016    Allergies:  Allergies  Allergen Reactions   Pollen Extract Hives    All tree pollens and dust mites  Latex Hives   Gluten Meal    Other     Seasonal:Grass, most trees, ragweed   Medications:  Current Outpatient Medications:    benzonatate (TESSALON) 100 MG capsule, Take 1 capsule (100 mg total) by mouth 3 (three) times daily as needed for cough., Disp: 30 capsule, Rfl: 0   molnupiravir EUA (LAGEVRIO) 200 mg CAPS capsule, Take 4 capsules (800 mg  total) by mouth 2 (two) times daily for 5 days., Disp: 40 capsule, Rfl: 0   ADVAIR HFA 45-21 MCG/ACT inhaler, USE 2 INHALATIONS TWICE A DAY (Patient taking differently: 2 puffs 2 (two) times daily as needed (shortness of breath).), Disp: 12 g, Rfl: 11   albuterol (VENTOLIN HFA) 108 (90 Base) MCG/ACT inhaler, Inhale 1-2 puffs into the lungs every 6 (six) hours as needed for wheezing or shortness of breath. USE 2 INHALATIONS EVERY 4 HOURS AS NEEDED FOR WHEEZING OR SHORTNESS OF BREATH Strength: 108 (90 Base) MCG/ACT, Disp: 17 g, Rfl: 1   Ascorbic Acid (VITAMIN C) 1000 MG tablet, Take 1 tablet by mouth daily., Disp: , Rfl:    aspirin EC 81 MG EC tablet, Take 1 tablet (81 mg total) by mouth daily., Disp: 30 tablet, Rfl: 1   atorvastatin (LIPITOR) 40 MG tablet, TAKE 1 TABLET (40 MG TOTAL) BY MOUTH DAILY., Disp: 30 tablet, Rfl: 1   cetirizine (ZYRTEC) 10 MG tablet, Take 10 mg by mouth daily., Disp: , Rfl:    cholecalciferol (VITAMIN D3) 25 MCG (1000 UNIT) tablet, Take 1,000 Units by mouth daily., Disp: , Rfl:    diclofenac Sodium (VOLTAREN) 1 % GEL, Apply 2 g topically 4 (four) times daily. (Patient not taking: Reported on 03/04/2022), Disp: , Rfl:    Magnesium Citrate (CITROMA PO), Take 1,000 mg by mouth daily. liquid, Disp: , Rfl:    montelukast (SINGULAIR) 10 MG tablet, TAKE 1 TABLET AT BEDTIME, Disp: 90 tablet, Rfl: 3   NONFORMULARY OR COMPOUNDED ITEM, Vaginal vitamin E suppository 200 IU.  Place one suppository per vagina at bedtime twice weekly.  Dispense:  8, Disp: 8 each, Rfl: 11   Omega-3 Fatty Acids (EQL OMEGA 3 FISH OIL) 1000 MG CPDR, Take 1,000 mg by mouth daily., Disp: , Rfl:    PREVIDENT 5000 BOOSTER PLUS 1.1 % PSTE, Place onto teeth., Disp: , Rfl:    vitamin B-12 (CYANOCOBALAMIN) 1000 MCG tablet, Take 1,000 mcg by mouth daily., Disp: , Rfl:   Observations/Objective: Patient is well-developed, well-nourished in no acute distress.  Resting comfortably at home.  Head is normocephalic,  atraumatic.  No labored breathing. Speech is clear and coherent with logical content.  Patient is alert and oriented at baseline.   Assessment and Plan: 1. COVID-19 - MyChart COVID-19 home monitoring program; Future - albuterol (VENTOLIN HFA) 108 (90 Base) MCG/ACT inhaler; Inhale 1-2 puffs into the lungs every 6 (six) hours as needed for wheezing or shortness of breath. USE 2 INHALATIONS EVERY 4 HOURS AS NEEDED FOR WHEEZING OR SHORTNESS OF BREATH Strength: 108 (90 Base) MCG/ACT  Dispense: 17 g; Refill: 1 - benzonatate (TESSALON) 100 MG capsule; Take 1 capsule (100 mg total) by mouth 3 (three) times daily as needed for cough.  Dispense: 30 capsule; Refill: 0 - molnupiravir EUA (LAGEVRIO) 200 mg CAPS capsule; Take 4 capsules (800 mg total) by mouth 2 (two) times daily for 5 days.  Dispense: 40 capsule; Refill: 0  Patient with multiple risk factors for complicated course of illness. Discussed risks/benefits of antiviral medications including most common potential ADRs.  Patient voiced understanding and would like to proceed with antiviral medication. They are candidate for both Paxlovid and molnupiravir but giving interaction from Paxlovid with her Advair and history of TIA (do not want to have her hold statin), will go with molnupiravir. Patient in agreement. Rx sent to pharmacy. Supportive measures, OTC medications and vitamin regimen reviewed. Tessalon per orders. Albuterol refilled. Patient has been enrolled in a MyChart COVID symptom monitoring program. Samule Dry reviewed in detail. Strict ER precautions discussed with patient.    Follow Up Instructions: I discussed the assessment and treatment plan with the patient. The patient was provided an opportunity to ask questions and all were answered. The patient agreed with the plan and demonstrated an understanding of the instructions.  A copy of instructions were sent to the patient via MyChart unless otherwise noted below.   The patient was advised  to call back or seek an in-person evaluation if the symptoms worsen or if the condition fails to improve as anticipated.  Time:  I spent 10 minutes with the patient via telehealth technology discussing the above problems/concerns.    Leeanne Rio, PA-C

## 2022-06-02 NOTE — Patient Instructions (Addendum)
Denise Gamble, thank you for joining Leeanne Rio, PA-C for today's virtual visit.  While this provider is not your primary care provider (PCP), if your PCP is located in our provider database this encounter information will be shared with them immediately following your visit.   [facility] account gives you access to today's visit and all your visits, tests, and labs performed at [facility] " click here if you don't have a [facility] account or go to [email or web address]  Consent: (Patient) Denise Gamble provided verbal consent for this virtual visit at the beginning of the encounter.  Current Medications:  Current Outpatient Medications:    ADVAIR HFA 45-21 MCG/ACT inhaler, USE 2 INHALATIONS TWICE A DAY (Patient taking differently: 2 puffs 2 (two) times daily as needed (shortness of breath).), Disp: 12 g, Rfl: 11   albuterol (VENTOLIN HFA) 108 (90 Base) MCG/ACT inhaler, USE 2 INHALATIONS EVERY 4 HOURS AS NEEDED FOR WHEEZING OR SHORTNESS OF BREATH (Patient taking differently: 2 puffs every 4 (four) hours as needed for wheezing or shortness of breath.), Disp: 17 g, Rfl: 1   Ascorbic Acid (VITAMIN C) 1000 MG tablet, Take 1 tablet by mouth daily., Disp: , Rfl:    aspirin EC 81 MG EC tablet, Take 1 tablet (81 mg total) by mouth daily., Disp: 30 tablet, Rfl: 1   atorvastatin (LIPITOR) 40 MG tablet, TAKE 1 TABLET (40 MG TOTAL) BY MOUTH DAILY., Disp: 30 tablet, Rfl: 1   cetirizine (ZYRTEC) 10 MG tablet, Take 10 mg by mouth daily., Disp: , Rfl:    cholecalciferol (VITAMIN D3) 25 MCG (1000 UNIT) tablet, Take 1,000 Units by mouth daily., Disp: , Rfl:    diclofenac Sodium (VOLTAREN) 1 % GEL, Apply 2 g topically 4 (four) times daily. (Patient not taking: Reported on 03/04/2022), Disp: , Rfl:    Magnesium Citrate (CITROMA PO), Take 1,000 mg by mouth daily. liquid, Disp: , Rfl:    montelukast (SINGULAIR) 10 MG tablet, TAKE 1 TABLET AT BEDTIME, Disp: 90  tablet, Rfl: 3   NONFORMULARY OR COMPOUNDED ITEM, Vaginal vitamin E suppository 200 IU.  Place one suppository per vagina at bedtime twice weekly.  Dispense:  8, Disp: 8 each, Rfl: 11   Omega-3 Fatty Acids (EQL OMEGA 3 FISH OIL) 1000 MG CPDR, Take 1,000 mg by mouth daily., Disp: , Rfl:    PREVIDENT 5000 BOOSTER PLUS 1.1 % PSTE, Place onto teeth., Disp: , Rfl:    vitamin B-12 (CYANOCOBALAMIN) 1000 MCG tablet, Take 1,000 mcg by mouth daily., Disp: , Rfl:    Medications ordered in this encounter:  No orders of the defined types were placed in this encounter.    *If you need refills on other medications prior to your next appointment, please contact your pharmacy*  Follow-Up: Call back or seek an in-person evaluation if the symptoms worsen or if the condition fails to improve as anticipated.  [place] [phone]  Other Instructions Please keep well-hydrated and get plenty of rest. Start a saline nasal rinse to flush out your nasal passages. Advil Allergy and Sinus or Vicks Sinex are listed as gluten free.  If you have a humidifier, running in the bedroom at night. I want you to start OTC vitamin D3 1000 units daily, vitamin C 1000 mg daily, and a zinc supplement. Please take prescribed medications as directed.  You have been enrolled in a MyChart symptom monitoring program. Please answer these questions daily so we can keep track  of how you are doing.  You were to quarantine for 5 days from onset of your symptoms.  After day 5, if you have had no fever and you are feeling better, you can end quarantine but need to mask for an additional 5 days. After day 5 if you have a fever or are having significant symptoms, please quarantine for full 10 days.  If you note any worsening of symptoms, any significant shortness of breath or any chest pain, please seek ER evaluation ASAP.  Please do not delay care!  COVID-19: What to Do if You Are Sick If you test positive and are an  older adult or someone who is at high risk of getting very sick from COVID-19, treatment may be available. Contact a healthcare provider right away after a positive test to determine if you are eligible, even if your symptoms are mild right now. You can also visit a Test to Treat location and, if eligible, receive a prescription from a provider. Don't delay: Treatment must be started within the first few days to be effective. If you have a fever, cough, or other symptoms, you might have COVID-19. Most people have mild illness and are able to recover at home. If you are sick: Keep track of your symptoms. If you have an emergency warning sign (including trouble breathing), call 911. Steps to help prevent the spread of COVID-19 if you are sick If you are sick with COVID-19 or think you might have COVID-19, follow the steps below to care for yourself and to help protect other people in your home and community. Stay home except to get medical care Stay home. Most people with COVID-19 have mild illness and can recover at home without medical care. Do not leave your home, except to get medical care. Do not visit public areas and do not go to places where you are unable to wear a mask. Take care of yourself. Get rest and stay hydrated. Take over-the-counter medicines, such as acetaminophen, to help you feel better. Stay in touch with your doctor. Call before you get medical care. Be sure to get care if you have trouble breathing, or have any other emergency warning signs, or if you think it is an emergency. Avoid public transportation, ride-sharing, or taxis if possible. Get tested If you have symptoms of COVID-19, get tested. While waiting for test results, stay away from others, including staying apart from those living in your household. Get tested as soon as possible after your symptoms start. Treatments may be available for people with COVID-19 who are at risk for becoming very sick. Don't delay: Treatment  must be started early to be effective--some treatments must begin within 5 days of your first symptoms. Contact your healthcare provider right away if your test result is positive to determine if you are eligible. Self-tests are one of several options for testing for the virus that causes COVID-19 and may be more convenient than laboratory-based tests and point-of-care tests. Ask your healthcare provider or your local health department if you need help interpreting your test results. You can visit your state, tribal, local, and territorial health department's website to look for the latest local information on testing sites. Separate yourself from other people As much as possible, stay in a specific room and away from other people and pets in your home. If possible, you should use a separate bathroom. If you need to be around other people or animals in or outside of the home, wear a well-fitting  mask. Tell your close contacts that they may have been exposed to COVID-19. An infected person can spread COVID-19 starting 48 hours (or 2 days) before the person has any symptoms or tests positive. By letting your close contacts know they may have been exposed to COVID-19, you are helping to protect everyone. See COVID-19 and Animals if you have questions about pets. If you are diagnosed with COVID-19, someone from the health department may call you. Answer the call to slow the spread. Monitor your symptoms Symptoms of COVID-19 include fever, cough, or other symptoms. Follow care instructions from your healthcare provider and local health department. Your local health authorities may give instructions on checking your symptoms and reporting information. When to seek emergency medical attention Look for emergency warning signs* for COVID-19. If someone is showing any of these signs, seek emergency medical care immediately: Trouble breathing Persistent pain or pressure in the chest New confusion Inability to  wake or stay awake Pale, gray, or blue-colored skin, lips, or nail beds, depending on skin tone *This list is not all possible symptoms. Please call your medical provider for any other symptoms that are severe or concerning to you. Call 911 or call ahead to your local emergency facility: Notify the operator that you are seeking care for someone who has or may have COVID-19. Call ahead before visiting your doctor Call ahead. Many medical visits for routine care are being postponed or done by phone or telemedicine. If you have a medical appointment that cannot be postponed, call your doctor's office, and tell them you have or may have COVID-19. This will help the office protect themselves and other patients. If you are sick, wear a well-fitting mask You should wear a mask if you must be around other people or animals, including pets (even at home). Wear a mask with the best fit, protection, and comfort for you. You don't need to wear the mask if you are alone. If you can't put on a mask (because of trouble breathing, for example), cover your coughs and sneezes in some other way. Try to stay at least 6 feet away from other people. This will help protect the people around you. Masks should not be placed on young children under age 30 years, anyone who has trouble breathing, or anyone who is not able to remove the mask without help. Cover your coughs and sneezes Cover your mouth and nose with a tissue when you cough or sneeze. Throw away used tissues in a lined trash can. Immediately wash your hands with soap and water for at least 20 seconds. If soap and water are not available, clean your hands with an alcohol-based hand sanitizer that contains at least 60% alcohol. Clean your hands often Wash your hands often with soap and water for at least 20 seconds. This is especially important after blowing your nose, coughing, or sneezing; going to the bathroom; and before eating or preparing food. Use hand  sanitizer if soap and water are not available. Use an alcohol-based hand sanitizer with at least 60% alcohol, covering all surfaces of your hands and rubbing them together until they feel dry. Soap and water are the best option, especially if hands are visibly dirty. Avoid touching your eyes, nose, and mouth with unwashed hands. Handwashing Tips Avoid sharing personal household items Do not share dishes, drinking glasses, cups, eating utensils, towels, or bedding with other people in your home. Wash these items thoroughly after using them with soap and water or put in the  dishwasher. Clean surfaces in your home regularly Clean and disinfect high-touch surfaces (for example, doorknobs, tables, handles, light switches, and countertops) in your "sick room" and bathroom. In shared spaces, you should clean and disinfect surfaces and items after each use by the person who is ill. If you are sick and cannot clean, a caregiver or other person should only clean and disinfect the area around you (such as your bedroom and bathroom) on an as needed basis. Your caregiver/other person should wait as long as possible (at least several hours) and wear a mask before entering, cleaning, and disinfecting shared spaces that you use. Clean and disinfect areas that may have blood, stool, or body fluids on them. Use household cleaners and disinfectants. Clean visible dirty surfaces with household cleaners containing soap or detergent. Then, use a household disinfectant. Use a product from [employer] List N: Disinfectants for Coronavirus (VCBSW-96). Be sure to follow the instructions on the label to ensure safe and effective use of the product. Many products recommend keeping the surface wet with a disinfectant for a certain period of time (look at "contact time" on the product label). You may also need to wear personal protective equipment, such as gloves, depending on the directions on the product label. Immediately after  disinfecting, wash your hands with soap and water for 20 seconds. For completed guidance on cleaning and disinfecting your home, visit Complete Disinfection Guidance. Take steps to improve ventilation at home Improve ventilation (air flow) at home to help prevent from spreading COVID-19 to other people in your household. Clear out COVID-19 virus particles in the air by opening windows, using air filters, and turning on fans in your home. Use this interactive tool to learn how to improve air flow in your home. When you can be around others after being sick with COVID-19 Deciding when you can be around others is different for different situations. Find out when you can safely end home isolation. For any additional questions about your care, contact your healthcare provider or state or local health department. 10/29/2020 Content source: [facility] ([facility]), Division of Viral Diseases This information is not intended to replace advice given to you by your health care provider. Make sure you discuss any questions you have with your health care provider. Document Revised: 12/12/2020 Document Reviewed: 12/12/2020 Elsevier Patient Education  2022 [employer].   If you have been instructed to have an in-person evaluation today at a local Urgent Care facility, please use the link below. It will take you to a list of all of our available [facility], including address, phone number and hours of operation. Please do not delay care.  [facility]  If you or a family member do not have a primary care provider, use the link below to schedule a visit and establish care. When you choose a [facility] primary care physician or advanced practice provider, you gain a long-term partner in health. Find a Primary Care Provider  Learn more about [facility]'s in-office and virtual care options: [facility]

## 2022-06-09 ENCOUNTER — Telehealth: Payer: Self-pay

## 2022-06-09 NOTE — Telephone Encounter (Signed)
Called patient in regard to the [place] completed on my chart. Left VM to call community line with any concerns.

## 2022-06-18 ENCOUNTER — Other Ambulatory Visit: Payer: Self-pay | Admitting: Physician Assistant

## 2022-06-18 DIAGNOSIS — J4520 Mild intermittent asthma, uncomplicated: Secondary | ICD-10-CM

## 2022-06-29 ENCOUNTER — Other Ambulatory Visit: Payer: Managed Care, Other (non HMO)

## 2022-06-29 DIAGNOSIS — K900 Celiac disease: Secondary | ICD-10-CM

## 2022-07-01 LAB — TISSUE TRANSGLUTAMINASE, IGA: (tTG) Ab, IgA: 3 U/mL

## 2022-07-09 ENCOUNTER — Ambulatory Visit: Payer: Managed Care, Other (non HMO) | Admitting: Internal Medicine

## 2022-07-13 ENCOUNTER — Other Ambulatory Visit: Payer: Self-pay | Admitting: Nurse Practitioner

## 2022-07-13 DIAGNOSIS — E785 Hyperlipidemia, unspecified: Secondary | ICD-10-CM

## 2022-08-06 ENCOUNTER — Other Ambulatory Visit: Payer: Self-pay | Admitting: Nurse Practitioner

## 2022-08-06 DIAGNOSIS — E785 Hyperlipidemia, unspecified: Secondary | ICD-10-CM

## 2022-08-25 ENCOUNTER — Ambulatory Visit: Payer: Managed Care, Other (non HMO) | Admitting: Physician Assistant

## 2022-09-01 ENCOUNTER — Ambulatory Visit (INDEPENDENT_AMBULATORY_CARE_PROVIDER_SITE_OTHER): Payer: Managed Care, Other (non HMO) | Admitting: Internal Medicine

## 2022-09-01 ENCOUNTER — Encounter: Payer: Self-pay | Admitting: Internal Medicine

## 2022-09-01 VITALS — BP 110/78 | HR 66 | Ht 63.0 in | Wt 222.0 lb

## 2022-09-01 DIAGNOSIS — K760 Fatty (change of) liver, not elsewhere classified: Secondary | ICD-10-CM

## 2022-09-01 DIAGNOSIS — K5900 Constipation, unspecified: Secondary | ICD-10-CM

## 2022-09-01 DIAGNOSIS — K900 Celiac disease: Secondary | ICD-10-CM | POA: Diagnosis not present

## 2022-09-01 NOTE — Patient Instructions (Signed)
_______________________________________________________  If your blood pressure at your visit was 140/90 or greater, please contact your primary care physician to follow up on this.  _______________________________________________________  If you are age 55 or older, your body mass index should be between 23-30. Your Body mass index is 39.33 kg/m. If this is out of the aforementioned range listed, please consider follow up with your Primary Care Provider.  If you are age 5 or younger, your body mass index should be between 19-25. Your Body mass index is 39.33 kg/m. If this is out of the aformentioned range listed, please consider follow up with your Primary Care Provider.   ________________________________________________________  The [facility] providers would like to encourage you to use [facility] to communicate with providers for non-urgent requests or questions.  Due to long hold times on the telephone, sending your provider a message by [facility] may be a faster and more efficient way to get a response.  Please allow 48 business hours for a response.  Please remember that this is for non-urgent requests.  _______________________________________________________  Use Miralax as needed  Please follow up in one year

## 2022-09-01 NOTE — Progress Notes (Signed)
HISTORY OF PRESENT ILLNESS:  Denise Gamble is a 57 y.o. female who presents today for follow-up regarding celiac disease diagnosed April 2023, mildly fluctuating elevation of alkaline phosphatase, fatty liver disease, and personal history of adenomatous colon polyps family history of colon cancer.  She was last seen in this office January 28, 2022.  Below is the final assessment and plan from that visit:  ASSESSMENT:   1.  Celiac disease.  Baseline tissue transglutaminase antibody IgA 22.8.  Upper endoscopy April 2023 with  MARSH IIIA lesion.  Now on gluten-free diet. 2.  Mild fluctuating elevation of alkaline phosphatase.  This is either due to fatty liver or possibly celiac. 3.  Obesity 4.  General medical problems 5.  History of adenomatous colon polyp December 2021. 6.  Family history of colon cancer in parent     PLAN:   1.  Detailed discussion on celiac disease.  Multiple questions answered to patient's satisfaction. 2.  Strict gluten-free diet 3.  Continue working with the nutritionist and pharmacist 4.  Graduated exercise and weight loss 5.  Repeat tissue transglutaminase antibody IgA in 6 months 6.  Office follow-up in 6 months 7.  Surveillance colonoscopy around December 2026   The patient had follow-up tissue transglutaminase antibody IgA November 2023.  This was normal at 3.  She tells me that she has been compliant with gluten avoidance.  She has yet to eat out.  She does have questions regarding monitoring of potential vitamin deficiencies, neuropathy, constipation, requesting a TSA note regarding dietary restriction, and mentions she is looking for a primary provider.  Patient has mild constipation.  Has been using magnesium.  No pain that can be uncomfortable.  She did undergo complete colonoscopy December 2021. Diminutive adenoma and sigmoid diverticulosis.  Otherwise normal.  Follow-up in 5 years recommended.      REVIEW OF SYSTEMS:  All non-GI ROS negative as  otherwise stated in the HPI except for neuropathy  Past Medical History:  Diagnosis Date   Allergy    seasonal allergies   Asthma    adult onset (1998)   Celiac disease 08/10/2021   GERD (gastroesophageal reflux disease)    for PRN use   History of MRSA infection    Miscarriage 2013   multiple    Paresthesia of lower extremity    Peripheral neuropathy 2019   left foot   Plantar fasciitis of left foot    Thoracic outlet syndrome    TIA (transient ischemic attack) 09/11/2021   Urinary incontinence     Past Surgical History:  Procedure Laterality Date   DILATION AND CURETTAGE OF UTERUS  2011   LYMPHADENECTOMY Left 1979   WISDOM TOOTH EXTRACTION      Social History Denise Gamble  reports that she has never smoked. She has never used smokeless tobacco. She reports that she does not drink alcohol and does not use drugs.  family history includes Alcohol abuse in her brother; Alzheimer's disease in her maternal aunt; Brain cancer (age of onset: 42) in her sister; Colon cancer (age of onset: 43) in her father; Colon polyps in her brother and mother; Colon polyps (age of onset: 75) in her brother; Colon polyps (age of onset: 21) in her father; Coronary artery disease in her brother; Dementia in her mother; Diabetes in her brother; Diverticulitis in her mother; Heart attack in her maternal grandfather; Heart disease in her brother; Heart disease (age of onset: 17) in her mother; Hyperlipidemia in her father and  sister; Hypertension in her brother, father, sister, sister, and sister; Leukemia in her father; Lung cancer (age of onset: 22) in her sister; Osteoporosis in her maternal grandmother, paternal grandmother, and sister; Prostate cancer in her brother; Skin cancer in her father; Stroke (age of onset: 30) in her sister.  Allergies  Allergen Reactions   Pollen Extract Hives    All tree pollens and dust mites   Latex Hives   Gluten Meal    Other     Seasonal:Grass, most trees,  ragweed       PHYSICAL EXAMINATION: Vital signs: BP 110/78   Pulse 66   Ht '5\' 3"'$  (1.6 m)   Wt 222 lb (100.7 kg)   LMP 02/10/2021 (Exact Date)   BMI 39.33 kg/m   Constitutional: generally well-appearing, no acute distress Psychiatric: alert and oriented x3, cooperative Eyes: extraocular movements intact, anicteric, conjunctiva pink Mouth: oral pharynx moist, no lesions Neck: supple no lymphadenopathy Cardiovascular: heart regular rate and rhythm, no murmur Lungs: clear to auscultation bilaterally Abdomen: soft, nontender, nondistended, no obvious ascites, no peritoneal signs, normal bowel sounds, no organomegaly Rectal: Omitted Extremities: no clubbing, cyanosis, or lower extremity edema bilaterally Skin: no lesions on visible extremities Neuro: No focal deficits.  Cranial nerves intact  ASSESSMENT:   1.  Celiac disease.  Baseline tissue transglutaminase antibody IgA 22.8.  Upper endoscopy April 2023 with  MARSH IIIA lesion.  Now on gluten-free diet.  Most recent TTG antibody 3 which supports gluten free compliance. 2.  Mild fluctuating elevation of alkaline phosphatase.  This is either due to fatty liver or possibly celiac.  Ongoing.  Last alkaline phosphatase July 2023 was 144.  Stable 3.  Obesity.  BMI 39.3 4.  General medical problems 5.  History of adenomatous colon polyp December 2021. 6.  Family history of colon cancer in parent-mother, advanced age     PLAN:   1.  Detailed discussion on celiac disease.  Multiple questions answered to patient's satisfaction. 2.  Continue strict gluten-free diet 3.  MiraLAX for constipation.  Titrate to need 4.  Graduated exercise and weight loss for fatty liver and overall health 5.  Repeat tissue transglutaminase antibody IgA in 1 year 6.  Office follow-up in 1 year 7.  Surveillance colonoscopy around December 2026 8.  Recommended her seeing a neurologist regarding issues with neuropathy 9.  Recommending [place] for primary care physician transition 10.  Note provided regarding her medical condition (celiac disease) and the need for special dietary considerations Total time of 40 minutes was spent preparing to see the patient, obtaining history, performing medically appropriate physical exam, counseling and educating patient regarding the above listed issues, ordering blood work and clinical follow-up, and documenting clinical information in the health record

## 2022-09-07 NOTE — Telephone Encounter (Signed)
NOTE NOT NEEDED ?

## 2022-09-11 ENCOUNTER — Other Ambulatory Visit: Payer: Self-pay | Admitting: Nurse Practitioner

## 2022-09-11 DIAGNOSIS — E785 Hyperlipidemia, unspecified: Secondary | ICD-10-CM

## 2022-09-30 ENCOUNTER — Ambulatory Visit (INDEPENDENT_AMBULATORY_CARE_PROVIDER_SITE_OTHER): Payer: Managed Care, Other (non HMO) | Admitting: Family Medicine

## 2022-09-30 ENCOUNTER — Encounter: Payer: Self-pay | Admitting: Family Medicine

## 2022-09-30 VITALS — BP 121/69 | HR 63 | Resp 18 | Ht 63.0 in | Wt 221.0 lb

## 2022-09-30 DIAGNOSIS — E559 Vitamin D deficiency, unspecified: Secondary | ICD-10-CM

## 2022-09-30 DIAGNOSIS — E785 Hyperlipidemia, unspecified: Secondary | ICD-10-CM | POA: Diagnosis not present

## 2022-09-30 DIAGNOSIS — G6289 Other specified polyneuropathies: Secondary | ICD-10-CM

## 2022-09-30 DIAGNOSIS — E538 Deficiency of other specified B group vitamins: Secondary | ICD-10-CM | POA: Diagnosis not present

## 2022-09-30 DIAGNOSIS — E038 Other specified hypothyroidism: Secondary | ICD-10-CM | POA: Insufficient documentation

## 2022-09-30 DIAGNOSIS — Z789 Other specified health status: Secondary | ICD-10-CM

## 2022-09-30 DIAGNOSIS — K900 Celiac disease: Secondary | ICD-10-CM

## 2022-09-30 NOTE — Patient Instructions (Addendum)
Just some info about disk disease for you to be aware of! Not sure if that's what's going on but it's one possibility.

## 2022-09-30 NOTE — Assessment & Plan Note (Signed)
She is still experiencing some of the pain and tingling going down her left leg.  She would like to see neurology again for further evaluation since it has not resolved as they previously thought that it would.

## 2022-09-30 NOTE — Assessment & Plan Note (Signed)
Stable, rechecking lipids today. Unless results warrant otherwise, continue atorvastatin 15m daily. Will continue to monitor.

## 2022-09-30 NOTE — Progress Notes (Signed)
Established Patient Office Visit  Subjective   Patient ID: Denise Gamble, female    DOB: Dec 27, 1965  Age: 57 y.o. MRN: [id number]  Chief Complaint  Patient presents with   Medical Management of Chronic Issues   Hyperlipidemia   HPI Denise Gamble is a 57 y.o. female presenting today for follow up of hyperlipidemia and thyroid. Hyperlipidemia: tolerating atorvastatin well with no myalgias or significant side effects.  Subclinical hypothyroidism: Endorses weight gain, fatigue, "wavy" fingernails and toenails that started about 3 months ago.  Denies heat/cold intolerance, bowel changes, CVS symptoms.   ROS Negative unless otherwise noted in HPI   Objective:     BP 121/69 (BP Location: Left Arm, Patient Position: Sitting, Cuff Size: Normal)   Pulse 63   Resp 18   Ht 5' 3"$  (1.6 m)   Wt 221 lb (100.2 kg)   LMP 02/10/2021 (Exact Date)   SpO2 98%   BMI 39.15 kg/m   Physical Exam Constitutional:      General: She is not in acute distress.    Appearance: Normal appearance.  HENT:     Head: Normocephalic and atraumatic.  Cardiovascular:     Rate and Rhythm: Normal rate and regular rhythm.     Pulses: Normal pulses.     Heart sounds: No murmur heard.    No friction rub. No gallop.  Pulmonary:     Effort: Pulmonary effort is normal. No respiratory distress.     Breath sounds: No wheezing, rhonchi or rales.  Skin:    General: Skin is warm and dry.  Neurological:     Mental Status: She is alert and oriented to person, place, and time.     Assessment & Plan:  Hyperlipidemia, unspecified hyperlipidemia type Assessment & Plan: Stable, rechecking lipids today. Unless results warrant otherwise, continue atorvastatin 51m daily. Will continue to monitor.   Orders: -     Lipid panel; Future  Subclinical hypothyroidism Assessment & Plan: Testing TSH as it has not been tested for a while and she is experiencing some symptoms that may be related.  We discussed that the  changes in her nails, weight, and energy levels may be related to her thyroid.  She is okay investigating this first given her history of subclinical hypothyroidism before looking into other potential causes.  Orders: -     TSH; Future  Vitamin D deficiency -     VITAMIN D 25 Hydroxy (Vit-D Deficiency, Fractures); Future  Vitamin B12 deficiency -     Vitamin B12; Future  Other polyneuropathy Assessment & Plan: She is still experiencing some of the pain and tingling going down her left leg.  She would like to see neurology again for further evaluation since it has not resolved as they previously thought that it would.  Orders: -     Ambulatory referral to Neurology  Vegetarian diet  Celiac disease  Previously has tested low for vitamin D and vitamin B-12 and is taking supplements for both right now.  She was diagnosed with celiac disease within the past year by GI and has since adopted a gluten-free and vegetarian diet.  We will check vitamin D and B12 to see if she needs to continue her supplements or if she is getting an adequate amount in food alone.  Return in about 6 months (around 03/31/2023) for annual physical, fasting blood work 1 week before.   I spent 35 minutes on the day of the encounter to include pre-visit record review, face-to-face  time with the patient and post visit ordering of test.  Velva Harman, PA

## 2022-09-30 NOTE — Assessment & Plan Note (Signed)
Testing TSH as it has not been tested for a while and she is experiencing some symptoms that may be related.  We discussed that the changes in her nails, weight, and energy levels may be related to her thyroid.  She is okay investigating this first given her history of subclinical hypothyroidism before looking into other potential causes.

## 2022-10-01 ENCOUNTER — Other Ambulatory Visit: Payer: Managed Care, Other (non HMO)

## 2022-10-01 DIAGNOSIS — E559 Vitamin D deficiency, unspecified: Secondary | ICD-10-CM

## 2022-10-01 DIAGNOSIS — E038 Other specified hypothyroidism: Secondary | ICD-10-CM

## 2022-10-01 DIAGNOSIS — E785 Hyperlipidemia, unspecified: Secondary | ICD-10-CM

## 2022-10-01 DIAGNOSIS — E538 Deficiency of other specified B group vitamins: Secondary | ICD-10-CM

## 2022-10-02 ENCOUNTER — Encounter (INDEPENDENT_AMBULATORY_CARE_PROVIDER_SITE_OTHER): Payer: Managed Care, Other (non HMO) | Admitting: Family Medicine

## 2022-10-02 DIAGNOSIS — E785 Hyperlipidemia, unspecified: Secondary | ICD-10-CM

## 2022-10-03 LAB — LIPID PANEL
Chol/HDL Ratio: 2.6 ratio (ref 0.0–4.4)
Cholesterol, Total: 139 mg/dL (ref 100–199)
HDL: 54 mg/dL (ref >39)
LDL Chol Calc (NIH): 68 mg/dL (ref 0–99)
Triglycerides: 90 mg/dL (ref 0–149)
VLDL Cholesterol Cal: 17 mg/dL (ref 5–40)

## 2022-10-03 LAB — VITAMIN D 25 HYDROXY (VIT D DEFICIENCY, FRACTURES): Vit D, 25-Hydroxy: 65.2 ng/mL (ref 30.0–100.0)

## 2022-10-03 LAB — VITAMIN B12: Vitamin B-12: 1281 pg/mL — ABNORMAL HIGH (ref 232–1245)

## 2022-10-03 LAB — TSH: TSH: 3.58 u[IU]/mL (ref 0.450–4.500)

## 2022-10-08 ENCOUNTER — Other Ambulatory Visit (INDEPENDENT_AMBULATORY_CARE_PROVIDER_SITE_OTHER): Payer: Managed Care, Other (non HMO)

## 2022-10-08 ENCOUNTER — Ambulatory Visit (INDEPENDENT_AMBULATORY_CARE_PROVIDER_SITE_OTHER): Payer: Managed Care, Other (non HMO) | Admitting: Physician Assistant

## 2022-10-08 ENCOUNTER — Encounter: Payer: Self-pay | Admitting: Physician Assistant

## 2022-10-08 VITALS — BP 118/73 | HR 83 | Resp 18 | Ht 63.0 in | Wt 227.0 lb

## 2022-10-08 DIAGNOSIS — R202 Paresthesia of skin: Secondary | ICD-10-CM | POA: Diagnosis not present

## 2022-10-08 LAB — FOLATE: Folate: 9.1 ng/mL (ref >5.9)

## 2022-10-08 MED ORDER — GABAPENTIN 100 MG PO CAPS: 100.0000 mg | ORAL_CAPSULE | Freq: Every day | ORAL | 3 refills | Status: DC

## 2022-10-08 NOTE — Patient Instructions (Addendum)
Labs today  Nerve conduction studies Continue PT  Gabapentin 100 mg at night  Follow up pending on the resutls   Labs today [address]

## 2022-10-08 NOTE — Progress Notes (Addendum)
Neurology clinic note  SERVICE DATE: 10/08/22  Reason for Evaluation: Consultation requested by Velva Harman, PA for an opinion regarding  bilateral leg paresthesias. My final recommendations will be communicated back to the requesting physician by way of shared medical record or letter to requesting physician via [place] mail.  HPI: This is [name], a 57 y.o. 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  Patient is here alone.  Similar episodes in the past?  Endorsed.  She has bilateral leg and foot pain, left greater than right at least dating back to 2020.  She never had any pertinent studies for this or formal diagnosis of neuropathy.  She also has a history of plantar fasciitis and B12 deficiency, both treated.  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  In the past, she has been told by PT to lessen the pain by thinking about it and telling the brain that there is no pain, and it helped to a certain extent ".    Numbness and tingling?  Endorsed, bilateral left greater than right Muscle bulk loss?  No  Muscle pain?  No  Cramps/Twitching?  No  Fatigable weakness?  Finished  PT "one leg is not as strong as the other leg. "L leg is longer than the R leg, and now I have an insole that helps" Does strength improve after brief exercise?  "In someway yes.  I have been practicing getting up from the floor without the use of my hands or standing on my tiptoes as instructed by my physical  therapist ".   She reports that she may have few "firearm pain, 8 out of 10 if I sit in a chair " Can you arise from squatted position easily? No   Able to get out of chair without using arms? Yes   Able to walk up steps easily?  "I don't tend to walk steps frequently, walking up is harder than going down " Use an assistive device to walk? No   Significant imbalance with walking?  She has a history of vestibular disease with hearing essentially within normal limits, normal tympanogram, doing VNG by ENT.  Pacemaker send dizziness and vertigo, the patient avoids crowded places such as "[employer] and [employer], which may have triggered it.  Rain also triggers the symptoms.  " Recent falls?  No Any change in urine color, especially after exertion/physical activity? No  The patient denies  diplopia, ptosis, dysphagia, poor saliva control, dysarthria/dysphonia, impaired mastication, facial weakness/droop. There are no neuromuscular respiratory weakness symptoms, particularly orthopnea>dyspnea.  Heat intolerance: Heat triggers fire to my legs History of  bladder dyscontrol,chronic Skin rashes?  No   ETOH use: No Restrictive diet?  No Family history of neuropathy/myopathy/NM disease?  No  Any biopsy done?  No Current medications being tried for the patient's symptoms include no Prior medications that have been tried: No     MEDICATIONS:  Outpatient Encounter Medications as of 10/08/2022  Medication Sig Note   ADVAIR HFA 45-21 MCG/ACT inhaler USE 2 INHALATIONS  TWICE A DAY    albuterol (VENTOLIN HFA) 108 (90 Base) MCG/ACT inhaler Inhale 1-2 puffs into the lungs every 6 (six) hours as needed for wheezing or shortness of breath. USE 2 INHALATIONS EVERY 4 HOURS AS NEEDED FOR WHEEZING OR SHORTNESS OF BREATH Strength: 108 (90 Base) MCG/ACT    Ascorbic Acid (VITAMIN C) 1000 MG tablet Take 1 tablet by mouth daily.    aspirin EC 81 MG EC tablet Take 1 tablet (81 mg total) by mouth daily.    atorvastatin  (LIPITOR) 40 MG tablet TAKE 1 TABLET (40 MG TOTAL) BY MOUTH DAILY.    cetirizine (ZYRTEC) 10 MG tablet Take 10 mg by mouth daily. 08/01/2015: Received from: [facility]   cholecalciferol (VITAMIN D3) 25 MCG (1000 UNIT) tablet Take 1,000 Units by mouth daily.    Magnesium Citrate (CITROMA PO) Take 1,000 mg by mouth daily. liquid    montelukast (SINGULAIR) 10 MG tablet TAKE 1 TABLET AT BEDTIME    NONFORMULARY OR COMPOUNDED ITEM Vaginal vitamin E suppository 200 IU.  Place one suppository per vagina at bedtime twice weekly.  Dispense:  8    Omega-3 Fatty Acids (EQL OMEGA 3 FISH OIL) 1000 MG CPDR Take 1,000 mg by mouth daily.    PREVIDENT 5000 BOOSTER PLUS 1.1 % PSTE Place onto teeth.    vitamin B-12 (CYANOCOBALAMIN) 1000 MCG tablet Take 1,000 mcg by mouth daily.    No facility-administered encounter medications on file as of 10/08/2022.    PAST MEDICAL HISTORY: Past Medical History:  Diagnosis Date   Allergy    seasonal allergies   Asthma    adult onset (1998)   Celiac disease 08/10/2021   GERD (gastroesophageal reflux disease)    for PRN use   History of MRSA infection    Miscarriage 2013   multiple    Paresthesia of lower extremity    Peripheral neuropathy 2019   left foot   Plantar fasciitis of left foot    Thoracic outlet syndrome    TIA (transient ischemic attack) 09/11/2021   Urinary incontinence     PAST SURGICAL HISTORY: Past Surgical History:  Procedure Laterality Date   DILATION AND CURETTAGE OF UTERUS  2011   LYMPHADENECTOMY Left 1979   WISDOM TOOTH EXTRACTION      ALLERGIES: Allergies  Allergen Reactions   Pollen Extract Hives    All tree pollens and dust mites   Latex Hives   Gluten Meal    Other     Seasonal:Grass, most trees, ragweed    FAMILY HISTORY: Family History  Problem Relation Age of Onset   Heart disease Mother 63       epstein anolmay    Dementia Mother    Colon polyps Mother    Diverticulitis Mother    Colon cancer Father 21    Skin cancer Father        ? thinks melanoma?    Leukemia Father        AAL   Hyperlipidemia Father    Hypertension Father    Colon polyps Father 65   Hyperlipidemia Sister    Hypertension Sister    Stroke Sister 55   Brain cancer Sister 75       mets from lung   Lung cancer Sister 69       mets to brain-then stroke   Hypertension Sister    Osteoporosis Sister    Hypertension Sister    Heart disease Brother    Alcohol abuse Brother  Diabetes Brother    Hypertension Brother    Prostate cancer Brother    Coronary artery disease Brother    Colon polyps Brother    Colon polyps Brother 28   Osteoporosis Maternal Grandmother    Heart attack Maternal Grandfather    Osteoporosis Paternal Grandmother    Alzheimer's disease Maternal Aunt    Breast cancer Neg Hx    Esophageal cancer Neg Hx    Stomach cancer Neg Hx    Rectal cancer Neg Hx     SOCIAL HISTORY: Social History   Tobacco Use   Smoking status: Never   Smokeless tobacco: Never  Vaping Use   Vaping Use: Never used  Substance Use Topics   Alcohol use: No    Alcohol/week: 0.0 standard drinks of alcohol    Comment: quit 2010   Drug use: No   Social History   Social History Narrative   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 No distress. Cooperative with exam.  Skin: No obvious rash or jaundice. HEENT: Atraumatic. Anicteric. Lungs: Non-labored breathing on room air  Heart: Regular Abdomen: Soft, non tender. Extremities: No edema. No obvious deformity.  Musculoskeletal: No obvious joint swelling. Psych: Affect appropriate.  Neurological: Mental Status: Alert. Speech fluent. No pseudobulbar affect Cranial Nerves: CNII: No RAPD. Visual fields grossly intact. CNIII, IV, VI: PERRL. No nystagmus. EOMI. CN V: Facial sensation intact  bilaterally to fine touch. [place] clench strong.   CN VII: Facial muscles symmetric and strong. No ptosis at rest or after sustained upgaze . CN VIII: Hearing grossly intact bilaterally. CN IX: No hypophonia. CN X: Palate elevates symmetrically. CN XI: Full strength shoulder shrug bilaterally. CN XII: Tongue protrusion full and midline. No atrophy or fasciculations. No significant dysarthria  Motor: Tone is normal         . No fasciculations in both extremities. No atrophy. No grip or percussive myotonia.  Individual muscle group testing (MRC grade out of 5): Normal flexion and extension DTR's 2/4 bilaterally in upper and lower extremities   Sensation: Pinprick: Normal bilaterally Vibration: Normal bilaterally Temperature: Normal bilaterally Proprioception: Normal bilaterally Coordination: Intact finger-to- nose-finger bilaterally. Romberg negative.  Gait: Able to rise from chair with arms crossed unassisted. Normal, narrow-based gait. Able to tandem walk. Able to walk on toes and heels.     ASSESSMENT AND PLAN : Jehnna Yakel is a 57 y.o. female who presents for evaluation of bilateral paresthesia.  Neuropathy labs will be ordered.  Blood workB1, B6, ESR, CRP, copper, SPEP, IFE, folate, ANA  Follow-up pending on the results. Gabapentin 100 mg nightly, side effects discussed     The impression above as well as the plan as outlined below were extensively discussed with the patient  who voiced understanding. All questions were answered to their satisfaction.  The patient was counseled on pertinent fall precautions per the printed material provided today, and as noted under the "Patient Instructions" section below.   When available, results of the above investigations and possible further recommendations will be communicated to the patient via telephone/MyChart. Patient to call office if not contacted after expected testing turnaround time.   Total time spent reviewing  records, interview, history/exam, documentation, and coordination of care on day of encounter:  5  min   Thank you for allowing me to participate in patient's care.  If I can answer any additional questions, I would be pleased to do so.   Sharene Butters, PA-C    CC: Velva Harman, PA [place] [id number]

## 2022-10-09 ENCOUNTER — Ambulatory Visit (INDEPENDENT_AMBULATORY_CARE_PROVIDER_SITE_OTHER): Payer: Managed Care, Other (non HMO) | Admitting: Neurology

## 2022-10-09 DIAGNOSIS — R202 Paresthesia of skin: Secondary | ICD-10-CM

## 2022-10-09 LAB — ANA: Anti Nuclear Antibody (ANA): NEGATIVE

## 2022-10-09 NOTE — Procedures (Signed)
[facility]  [place], [address]  [place], [place] [address] Tel: [phone] Fax: [phone] Test Date:  10/09/2022  Patient: Denise Gamble DOB: Nov 20, 1965 Physician: Narda Amber, DO  Sex: Female Height: '5\' 3"'$  Ref Phys: Sharene Butters, PA-C  ID#: [id number]   Technician:    History: This is a 57 year old female referred for evaluation of bilateral feet paresthesias.  NCV & EMG Findings: Electrodiagnostic testing of the right lower extremity and additional studies of the left shows: Bilateral sural and superficial peroneal sensory responses are within normal limits. Bilateral peroneal and tibial motor responses are within normal limits. Bilateral tibial H reflex studies are within normal limits. There is no evidence of active or chronic motor axonal changes affecting any of the tested muscles.  Motor unit configuration and recruitment pattern is within normal limits.  Impression: This is a normal study of the lower extremities.  In particular, there is no evidence of a large fiber sensorimotor polyneuropathy or lumbosacral radiculopathy.   ___________________________ Narda Amber, DO    Nerve Conduction Studies   Stim Site NR Peak (ms) Norm Peak (ms) O-P Amp (V) Norm O-P Amp  Left Sup Peroneal Anti Sensory (Ant Lat Mall)  32 C  12 cm    2.3 <4.6 10.0 >4  Right Sup Peroneal Anti Sensory (Ant Lat Mall)  32 C  12 cm    1.6 <4.6 12.0 >4  Left Sural Anti Sensory (Lat Mall)  32 C  Calf    2.3 <4.6 11.4 >4  Right Sural Anti Sensory (Lat Mall)  32 C  Calf    2.1 <4.6 12.5 >4     Stim Site NR Onset (ms) Norm Onset (ms) O-P Amp (mV) Norm O-P Amp Site1 Site2 Delta-0 (ms) Dist (cm) Vel (m/s) Norm Vel (m/s)  Left Peroneal Motor (Ext Dig Brev)  32 C  Ankle    3.0 <6.0 2.8 >2.5 B Fib Ankle 6.6 36.0 55 >40  B Fib    9.6  2.8  Poplt B Fib 1.5 8.0 53 >40  Poplt    11.1  2.8         Right Peroneal Motor (Ext Dig Brev)  32 C  Ankle    2.5 <6.0 3.4 >2.5 B Fib Ankle  6.6 36.0 55 >40  B Fib    9.1  3.2  Poplt B Fib 1.6 9.0 56 >40  Poplt    10.7  3.2         Left Tibial Motor (Abd Hall Brev)  32 C  Ankle    4.1 <6.0 14.8 >4 Knee Ankle 6.9 40.0 58 >40  Knee    11.0  12.0         Right Tibial Motor (Abd Hall Brev)  32 C  Ankle    3.5 <6.0 17.3 >4 Knee Ankle 7.0 38.0 54 >40  Knee    10.5  9.9          Electromyography   Side Muscle Ins.Act Fibs Fasc Recrt Amp Dur Poly Activation Comment  Right AntTibialis Nml Nml Nml Nml Nml Nml Nml Nml N/A  Right Gastroc Nml Nml Nml Nml Nml Nml Nml Nml N/A  Right Flex Dig Long Nml Nml Nml Nml Nml Nml Nml Nml N/A  Right RectFemoris Nml Nml Nml Nml Nml Nml Nml Nml N/A  Right GluteusMed Nml Nml Nml Nml Nml Nml Nml Nml N/A  Left AntTibialis Nml Nml Nml Nml Nml Nml Nml Nml N/A  Left Gastroc Nml  Nml Nml Nml Nml Nml Nml Nml N/A  Left Flex Dig Long Nml Nml Nml Nml Nml Nml Nml Nml N/A  Left RectFemoris Nml Nml Nml Nml Nml Nml Nml Nml N/A  Left GluteusMed Nml Nml Nml Nml Nml Nml Nml Nml N/A      Waveforms:

## 2022-10-10 ENCOUNTER — Other Ambulatory Visit: Payer: Self-pay | Admitting: Physician Assistant

## 2022-10-10 MED ORDER — GABAPENTIN 100 MG PO CAPS: 100.0000 mg | ORAL_CAPSULE | Freq: Every day | ORAL | 0 refills | Status: DC

## 2022-10-10 NOTE — Progress Notes (Signed)
Nerve conduction study is normal.  no evidence of a large fiber  polyneuropathy or lumbosacral radiculopathy.( Nerve root problems). No neurological follow up is indicated, recommend physical therapy, follow up with your primary. If you are on gabapentin, not quite sure that this will be necessary since neuropathy is not noted on the test. Remember about biofeedback discussed during the visit.. Thanks

## 2022-10-13 ENCOUNTER — Other Ambulatory Visit: Payer: Self-pay | Admitting: Nurse Practitioner

## 2022-10-13 DIAGNOSIS — E785 Hyperlipidemia, unspecified: Secondary | ICD-10-CM

## 2022-10-16 ENCOUNTER — Telehealth: Payer: Self-pay | Admitting: Physician Assistant

## 2022-10-16 NOTE — Telephone Encounter (Signed)
Pt called in returning Christy's call about results

## 2022-10-21 LAB — IMMUNOFIXATION ELECTROPHORESIS
IgG (Immunoglobin G), Serum: 1237 mg/dL (ref 600–1640)
IgM, Serum: 76 mg/dL (ref 50–300)
Immunoglobulin A: 336 mg/dL — ABNORMAL HIGH (ref 47–310)

## 2022-10-21 LAB — VITAMIN B1: Vitamin B1 (Thiamine): 7 nmol/L — ABNORMAL LOW (ref 8–30)

## 2022-10-21 LAB — PROTEIN ELECTROPHORESIS, SERUM
Albumin ELP: 4.1 g/dL (ref 3.8–4.8)
Alpha 1: 0.2 g/dL (ref 0.2–0.3)
Alpha 2: 0.7 g/dL (ref 0.5–0.9)
Beta 2: 0.5 g/dL (ref 0.2–0.5)
Beta Globulin: 0.5 g/dL (ref 0.4–0.6)
Gamma Globulin: 1.1 g/dL (ref 0.8–1.7)
Total Protein: 7.1 g/dL (ref 6.1–8.1)

## 2022-10-21 LAB — VITAMIN B6: Vitamin B6: 5.1 ng/mL (ref 2.1–21.7)

## 2022-10-21 LAB — COPPER, SERUM: Copper: 135 ug/dL (ref 70–175)

## 2022-11-18 ENCOUNTER — Other Ambulatory Visit: Payer: Self-pay | Admitting: Nurse Practitioner

## 2022-11-18 DIAGNOSIS — E785 Hyperlipidemia, unspecified: Secondary | ICD-10-CM

## 2022-11-19 ENCOUNTER — Other Ambulatory Visit: Payer: Self-pay | Admitting: Family Medicine

## 2022-11-19 DIAGNOSIS — Z1231 Encounter for screening mammogram for malignant neoplasm of breast: Secondary | ICD-10-CM

## 2022-12-24 ENCOUNTER — Ambulatory Visit
Admission: RE | Admit: 2022-12-24 | Discharge: 2022-12-24 | Disposition: A | Discharge status: Home / Self Care | Payer: Managed Care, Other (non HMO) | Source: Ambulatory Visit | Attending: Family Medicine | Admitting: Family Medicine

## 2022-12-24 DIAGNOSIS — Z1231 Encounter for screening mammogram for malignant neoplasm of breast: Secondary | ICD-10-CM | POA: Diagnosis not present

## 2023-02-15 ENCOUNTER — Other Ambulatory Visit: Payer: Self-pay | Admitting: Family Medicine

## 2023-02-15 DIAGNOSIS — E785 Hyperlipidemia, unspecified: Secondary | ICD-10-CM

## 2023-03-03 ENCOUNTER — Encounter: Payer: Self-pay | Admitting: Family Medicine

## 2023-03-16 ENCOUNTER — Other Ambulatory Visit: Payer: Self-pay | Admitting: Family Medicine

## 2023-03-16 DIAGNOSIS — E038 Other specified hypothyroidism: Secondary | ICD-10-CM

## 2023-03-16 DIAGNOSIS — E785 Hyperlipidemia, unspecified: Secondary | ICD-10-CM

## 2023-03-16 DIAGNOSIS — Z0000 Encounter for general adult medical examination without abnormal findings: Secondary | ICD-10-CM

## 2023-03-24 ENCOUNTER — Other Ambulatory Visit: Payer: Managed Care, Other (non HMO)

## 2023-03-24 DIAGNOSIS — E038 Other specified hypothyroidism: Secondary | ICD-10-CM

## 2023-03-24 DIAGNOSIS — E785 Hyperlipidemia, unspecified: Secondary | ICD-10-CM

## 2023-03-24 DIAGNOSIS — Z0000 Encounter for general adult medical examination without abnormal findings: Secondary | ICD-10-CM

## 2023-03-25 LAB — CBC
Hematocrit: 39.3 % (ref 34.0–46.6)
Hemoglobin: 13.2 g/dL (ref 11.1–15.9)
MCH: 32.6 pg (ref 26.6–33.0)
MCHC: 33.6 g/dL (ref 31.5–35.7)
MCV: 97 fL (ref 79–97)
Platelets: 239 10*3/uL (ref 150–450)
RBC: 4.05 x10E6/uL (ref 3.77–5.28)
RDW: 12.2 % (ref 11.7–15.4)
WBC: 6.2 10*3/uL (ref 3.4–10.8)

## 2023-03-25 LAB — COMPREHENSIVE METABOLIC PANEL
ALT: 23 IU/L (ref 0–32)
AST: 20 IU/L (ref 0–40)
Albumin: 3.9 g/dL (ref 3.8–4.9)
Alkaline Phosphatase: 130 IU/L — ABNORMAL HIGH (ref 44–121)
BUN/Creatinine Ratio: 25 — ABNORMAL HIGH (ref 9–23)
BUN: 19 mg/dL (ref 6–24)
Bilirubin Total: 0.6 mg/dL (ref 0.0–1.2)
CO2: 26 mmol/L (ref 20–29)
Calcium: 9.5 mg/dL (ref 8.7–10.2)
Chloride: 105 mmol/L (ref 96–106)
Creatinine, Ser: 0.76 mg/dL (ref 0.57–1.00)
Globulin, Total: 2.6 g/dL (ref 1.5–4.5)
Glucose: 85 mg/dL (ref 70–99)
Potassium: 4.4 mmol/L (ref 3.5–5.2)
Sodium: 142 mmol/L (ref 134–144)
Total Protein: 6.5 g/dL (ref 6.0–8.5)
eGFR: 93 mL/min/{1.73_m2} (ref >59)

## 2023-03-25 LAB — LIPID PANEL
Chol/HDL Ratio: 2.5 ratio (ref 0.0–4.4)
Cholesterol, Total: 152 mg/dL (ref 100–199)
HDL: 60 mg/dL (ref >39)
LDL Chol Calc (NIH): 79 mg/dL (ref 0–99)
Triglycerides: 63 mg/dL (ref 0–149)
VLDL Cholesterol Cal: 13 mg/dL (ref 5–40)

## 2023-03-25 LAB — HEMOGLOBIN A1C
Est. average glucose Bld gHb Est-mCnc: 117 mg/dL
Hgb A1c MFr Bld: 5.7 % — ABNORMAL HIGH (ref 4.8–5.6)

## 2023-03-25 LAB — TSH: TSH: 5.15 u[IU]/mL — ABNORMAL HIGH (ref 0.450–4.500)

## 2023-03-31 ENCOUNTER — Ambulatory Visit (INDEPENDENT_AMBULATORY_CARE_PROVIDER_SITE_OTHER): Payer: Managed Care, Other (non HMO) | Admitting: Family Medicine

## 2023-03-31 ENCOUNTER — Encounter: Payer: Self-pay | Admitting: Family Medicine

## 2023-03-31 VITALS — BP 114/81 | HR 66 | Resp 18 | Ht 63.0 in | Wt 217.0 lb

## 2023-03-31 DIAGNOSIS — E785 Hyperlipidemia, unspecified: Secondary | ICD-10-CM | POA: Diagnosis not present

## 2023-03-31 DIAGNOSIS — L299 Pruritus, unspecified: Secondary | ICD-10-CM | POA: Insufficient documentation

## 2023-03-31 DIAGNOSIS — H832X9 Labyrinthine dysfunction, unspecified ear: Secondary | ICD-10-CM | POA: Diagnosis not present

## 2023-03-31 DIAGNOSIS — E038 Other specified hypothyroidism: Secondary | ICD-10-CM

## 2023-03-31 DIAGNOSIS — R7303 Prediabetes: Secondary | ICD-10-CM | POA: Diagnosis not present

## 2023-03-31 DIAGNOSIS — Z0000 Encounter for general adult medical examination without abnormal findings: Secondary | ICD-10-CM

## 2023-03-31 DIAGNOSIS — K900 Celiac disease: Secondary | ICD-10-CM | POA: Insufficient documentation

## 2023-03-31 DIAGNOSIS — R7989 Other specified abnormal findings of blood chemistry: Secondary | ICD-10-CM

## 2023-03-31 MED ORDER — ATORVASTATIN CALCIUM 40 MG PO TABS
40.0000 mg | ORAL_TABLET | Freq: Every day | ORAL | 3 refills | Status: DC
Start: 2023-03-31 — End: 2023-06-22

## 2023-03-31 NOTE — Assessment & Plan Note (Signed)
Most recent TSH 5.150, testing T4 today.  Depending on results, may initiate low-dose of levothyroxine.  Will continue to monitor.

## 2023-03-31 NOTE — Assessment & Plan Note (Signed)
Last lipid panel: LDL 79, HDL 60, triglycerides 63.  Continue atorvastatin 40 mg daily.  CMP essentially within normal limits/stable from last check.  Will continue to monitor.

## 2023-03-31 NOTE — Assessment & Plan Note (Signed)
A1C 5.7. Will continue to monitor.

## 2023-03-31 NOTE — Assessment & Plan Note (Signed)
After TIA, developed vestibular disequilibrium. As a result, she cannot move her head quickly. She also cannot drive in the rain since the rain plus windshield wiper movement causes vertigo and disorientation.

## 2023-03-31 NOTE — Progress Notes (Signed)
Complete physical exam  Patient: Denise Gamble   DOB: Oct 26, 1965   57 y.o. Female  MRN: [id number]  Subjective:    Chief Complaint  Patient presents with   Annual Exam    Calle Rentschler is a 57 y.o. female who presents today for a complete physical exam. She reports consuming a  gluten-free  diet.  She generally feels fairly well.  She has been seeing the [facility] and working with them, she is currently on a 1000 -calorie/day diet as she has found that her metabolism is very slow.  She has also noticed that her nails have been very brittle for approaching the 1 year.  She started taking an iron supplement about 6 months ago but has not noticed any improvement.  Additionally, she notes that her scalp is often itchy no matter what shampoo she uses.   Most recent fall risk assessment:    10/08/2022    8:00 AM  Fall Risk   Falls in the past year? 1  Number falls in past yr: 1  Injury with Fall? 0  Follow up Falls evaluation completed     Most recent depression and anxiety screenings:    03/31/2023   10:22 AM 09/30/2022    1:25 PM  PHQ 2/9 Scores  PHQ - 2 Score 0 0  PHQ- 9 Score 0       02/23/2022    9:16 AM 11/20/2021    8:22 AM 02/14/2021   10:57 AM 01/07/2021   10:23 AM  GAD 7 : Generalized Anxiety Score  Nervous, Anxious, on Edge 0 1 0 1  Control/stop worrying 0 1 0 0  Worry too much - different things 0 1 1 0  Trouble relaxing 0 1 1 0  Restless 0 1 0 0  Easily annoyed or irritable 0 1 0 1  Afraid - awful might happen 0 1 0 0  Total GAD 7 Score 0 7 2 2   Anxiety Difficulty Not difficult at all       Patient Active Problem List   Diagnosis Date Noted   Celiac disease 03/31/2023   Scalp pruritus 03/31/2023   Vestibular disequilibrium 03/31/2023   Prediabetes 03/31/2023   Subclinical hypothyroidism 09/30/2022   TIA (transient ischemic attack) 10/13/2021   Urinary incontinence 10/24/2019   Thoracic outlet syndrome 10/24/2019   Environmental and  seasonal allergies 10/24/2019   Hyperlipidemia 10/24/2019   Peripheral neuropathy 12/14/2016   Morbid obesity (HCC) 09/15/2016   Asthma 09/14/2016    Past Surgical History:  Procedure Laterality Date   DILATION AND CURETTAGE OF UTERUS  2011   LYMPHADENECTOMY Left 1979   WISDOM TOOTH EXTRACTION     Social History   Tobacco Use   Smoking status: Never    Passive exposure: Never   Smokeless tobacco: Never  Vaping Use   Vaping status: Never Used  Substance Use Topics   Alcohol use: No    Alcohol/week: 0.0 standard drinks of alcohol    Comment: quit 2010   Drug use: No   Family History  Problem Relation Age of Onset   Heart disease Mother 55       epstein anolmay    Dementia Mother    Colon polyps Mother    Diverticulitis Mother    Colon cancer Father 93   Skin cancer Father        ? thinks melanoma?    Leukemia Father        AAL   Hyperlipidemia  Father    Hypertension Father    Colon polyps Father 63   Hyperlipidemia Sister    Hypertension Sister    Stroke Sister 20   Brain cancer Sister 60       mets from lung   Lung cancer Sister 60       mets to brain-then stroke   Hypertension Sister    Osteoporosis Sister    Hypertension Sister    Heart disease Brother    Alcohol abuse Brother    Diabetes Brother    Hypertension Brother    Prostate cancer Brother    Coronary artery disease Brother    Colon polyps Brother    Colon polyps Brother 95   Osteoporosis Maternal Grandmother    Heart attack Maternal Grandfather    Osteoporosis Paternal Grandmother    Alzheimer's disease Maternal Aunt    Breast cancer Neg Hx    Esophageal cancer Neg Hx    Stomach cancer Neg Hx    Rectal cancer Neg Hx    Allergies  Allergen Reactions   Pollen Extract Hives    All tree pollens and dust mites   Latex Hives   Gluten Meal    Other     Seasonal:Grass, most trees, ragweed   Wheat      Patient Care Team: Melida Quitter, PA as PCP - General (Family Medicine) Patton Salles, MD as Consulting Physician (Obstetrics and Gynecology)   Outpatient Medications Prior to Visit  Medication Sig   ADVAIR HFA 45-21 MCG/ACT inhaler USE 2 INHALATIONS TWICE A DAY   albuterol (VENTOLIN HFA) 108 (90 Base) MCG/ACT inhaler Inhale 1-2 puffs into the lungs every 6 (six) hours as needed for wheezing or shortness of breath. USE 2 INHALATIONS EVERY 4 HOURS AS NEEDED FOR WHEEZING OR SHORTNESS OF BREATH Strength: 108 (90 Base) MCG/ACT   Ascorbic Acid (VITAMIN C) 1000 MG tablet Take 1 tablet by mouth daily.   aspirin EC 81 MG EC tablet Take 1 tablet (81 mg total) by mouth daily.   cetirizine (ZYRTEC) 10 MG tablet Take 10 mg by mouth daily.   cholecalciferol (VITAMIN D3) 25 MCG (1000 UNIT) tablet Take 1,000 Units by mouth daily.   Magnesium Citrate (CITROMA PO) Take 1,000 mg by mouth daily. liquid   montelukast (SINGULAIR) 10 MG tablet TAKE 1 TABLET AT BEDTIME   Omega-3 Fatty Acids (EQL OMEGA 3 FISH OIL) 1000 MG CPDR Take 1,000 mg by mouth daily.   PREVIDENT 5000 BOOSTER PLUS 1.1 % PSTE Place onto teeth.   vitamin B-12 (CYANOCOBALAMIN) 1000 MCG tablet Take 1,000 mcg by mouth daily.   [DISCONTINUED] atorvastatin (LIPITOR) 40 MG tablet TAKE 1 TABLET (40 MG TOTAL) BY MOUTH DAILY.   [DISCONTINUED] gabapentin (NEURONTIN) 100 MG capsule Take 1 capsule (100 mg total) by mouth at bedtime.   [DISCONTINUED] NONFORMULARY OR COMPOUNDED ITEM Vaginal vitamin E suppository 200 IU.  Place one suppository per vagina at bedtime twice weekly.  Dispense:  8   No facility-administered medications prior to visit.    Review of Systems  Constitutional:  Negative for chills, fever and malaise/fatigue.  HENT:  Negative for congestion and hearing loss.   Eyes:  Negative for blurred vision and double vision.  Respiratory:  Negative for cough and shortness of breath.   Cardiovascular:  Negative for chest pain, palpitations and leg swelling.  Gastrointestinal:  Negative for abdominal pain,  constipation, diarrhea and heartburn.  Genitourinary:  Negative for frequency and urgency.  Musculoskeletal:  Negative for myalgias and neck pain.  Neurological:  Negative for headaches.  Endo/Heme/Allergies:  Negative for polydipsia.  Psychiatric/Behavioral:  Negative for depression. The patient is not nervous/anxious.       Objective:    BP 114/81 (BP Location: Left Arm, Patient Position: Sitting, Cuff Size: Normal)   Pulse 66   Resp 18   Ht 5\' 3"  (1.6 m)   Wt 217 lb (98.4 kg)   LMP 02/10/2021 (Exact Date)   SpO2 98%   BMI 38.44 kg/m    Physical Exam Constitutional:      General: She is not in acute distress.    Appearance: Normal appearance.  HENT:     Head: Normocephalic and atraumatic.     Right Ear: Tympanic membrane, ear canal and external ear normal.     Left Ear: Tympanic membrane, ear canal and external ear normal.     Nose: Nose normal.     Mouth/Throat:     Mouth: Mucous membranes are moist.     Pharynx: No oropharyngeal exudate or posterior oropharyngeal erythema.  Eyes:     Extraocular Movements: Extraocular movements intact.     Conjunctiva/sclera: Conjunctivae normal.     Pupils: Pupils are equal, round, and reactive to light.     Comments: Wears contacts, prescription up to date  Neck:     Thyroid: No thyroid mass, thyromegaly or thyroid tenderness.  Cardiovascular:     Rate and Rhythm: Normal rate and regular rhythm.     Heart sounds: Normal heart sounds. No murmur heard.    No friction rub. No gallop.  Pulmonary:     Effort: Pulmonary effort is normal. No respiratory distress.     Breath sounds: Normal breath sounds. No wheezing, rhonchi or rales.  Abdominal:     General: Abdomen is flat. Bowel sounds are normal. There is no distension.     Palpations: There is no mass.     Tenderness: There is no abdominal tenderness. There is no guarding.  Musculoskeletal:        General: Normal range of motion.     Cervical back: Normal range of motion and  neck supple.  Lymphadenopathy:     Cervical: No cervical adenopathy.  Skin:    General: Skin is warm and dry.  Neurological:     Mental Status: She is alert and oriented to person, place, and time.     Cranial Nerves: No cranial nerve deficit.     Motor: No weakness.     Deep Tendon Reflexes: Reflexes normal.  Psychiatric:        Mood and Affect: Mood normal.       Assessment & Plan:    Routine Health Maintenance and Physical Exam  Immunization History  Administered Date(s) Administered   Influenza,inj,Quad PF,6+ Mos 06/09/2021   Influenza-Unspecified 04/08/2017, 05/26/2018   Janssen (J&J) SARS-COV-2 Vaccination 11/18/2019   MODERNA COVID-19 SARS-COV-2 PEDS BIVALENT BOOSTER 38yr-37yr 06/09/2021   Pneumococcal Polysaccharide-23 12/14/2016   Tdap 01/01/2020   Zoster Recombinant(Shingrix) 12/13/2019, 03/10/2022    Health Maintenance  Topic Date Due   COVID-19 Vaccine (3 - 2023-24 season) 04/10/2022   INFLUENZA VACCINE  11/08/2023 (Originally 03/11/2023)   MAMMOGRAM  12/23/2024   PAP SMEAR-Modifier  03/04/2025   Colonoscopy  07/22/2025   DTaP/Tdap/Td (2 - Td or Tdap) 12/31/2029   Hepatitis C Screening  Completed   HIV Screening  Completed   Zoster Vaccines- Shingrix  Completed   HPV VACCINES  Aged Out    Reviewed most  recent labs including CBC, CMP, lipid panel, A1C, TSH, and vitamin D. All within normal limits/stable from last check other than elevated TSH at 5.150, prediabetic A1c at 5.7, slightly elevated BUN/creatinine ratio, and mildly elevated alkaline phosphatase, though it has decreased from last year to 130 from 144.  We are rechecking T4 today and we will continue to monitor A1c.  Discussed health benefits of physical activity, and encouraged her to engage in regular exercise appropriate for her age and condition.  Wellness examination  Vestibular disequilibrium, unspecified laterality Assessment & Plan: After TIA, developed vestibular disequilibrium. As a  result, she cannot move her head quickly. She also cannot drive in the rain since the rain plus windshield wiper movement causes vertigo and disorientation.   Prediabetes Assessment & Plan: A1C 5.7. Will continue to monitor.   Hyperlipidemia, unspecified hyperlipidemia type Assessment & Plan: Last lipid panel: LDL 79, HDL 60, triglycerides 63.  Continue atorvastatin 40 mg daily.  CMP essentially within normal limits/stable from last check.  Will continue to monitor.  Orders: -     Atorvastatin Calcium; Take 1 tablet (40 mg total) by mouth daily.  Dispense: 90 tablet; Refill: 3  Subclinical hypothyroidism Assessment & Plan: Most recent TSH 5.150, testing T4 today.  Depending on results, may initiate low-dose of levothyroxine.  Will continue to monitor.   Abnormal TSH -     T4, free; Future    Return in about 6 months (around 10/01/2023) for follow-up for HLD, thyroid, prediabetes.   Melida Quitter, PA

## 2023-04-01 LAB — T4, FREE: Free T4: 1.05 ng/dL (ref 0.82–1.77)

## 2023-04-02 ENCOUNTER — Encounter: Payer: Self-pay | Admitting: Family Medicine

## 2023-06-21 ENCOUNTER — Encounter: Payer: Self-pay | Admitting: Family Medicine

## 2023-06-21 DIAGNOSIS — J302 Other seasonal allergic rhinitis: Secondary | ICD-10-CM

## 2023-06-21 DIAGNOSIS — J4520 Mild intermittent asthma, uncomplicated: Secondary | ICD-10-CM

## 2023-06-21 DIAGNOSIS — E785 Hyperlipidemia, unspecified: Secondary | ICD-10-CM

## 2023-06-21 DIAGNOSIS — U071 COVID-19: Secondary | ICD-10-CM

## 2023-06-22 MED ORDER — ATORVASTATIN CALCIUM 40 MG PO TABS: 40.0000 mg | ORAL_TABLET | Freq: Every day | ORAL | 0 refills | Status: DC

## 2023-06-22 MED ORDER — MONTELUKAST SODIUM 10 MG PO TABS: 10.0000 mg | ORAL_TABLET | Freq: Every day | ORAL | 0 refills | Status: DC

## 2023-06-22 MED ORDER — ALBUTEROL SULFATE HFA 108 (90 BASE) MCG/ACT IN AERS
1.0000 | INHALATION_SPRAY | Freq: Four times a day (QID) | RESPIRATORY_TRACT | 1 refills | Status: AC | PRN
Start: 2023-06-22 — End: ?

## 2023-06-22 MED ORDER — MONTELUKAST SODIUM 10 MG PO TABS
10.0000 mg | ORAL_TABLET | Freq: Every day | ORAL | 3 refills | Status: DC
Start: 2023-06-22 — End: 2023-06-22

## 2023-06-22 MED ORDER — ATORVASTATIN CALCIUM 40 MG PO TABS
40.0000 mg | ORAL_TABLET | Freq: Every day | ORAL | 3 refills | Status: DC
Start: 2023-06-22 — End: 2023-06-22

## 2023-06-22 MED ORDER — ADVAIR HFA 45-21 MCG/ACT IN AERO: 2.0000 | INHALATION_SPRAY | Freq: Two times a day (BID) | RESPIRATORY_TRACT | 11 refills | Status: DC

## 2023-07-29 IMAGING — MG MM DIGITAL SCREENING BILAT W/ TOMO AND CAD
8 series · 8 of 24 positions shown · non-contrast
Comparison: Previous exam(s).

CLINICAL DATA: Screening.

EXAM:
DIGITAL SCREENING BILATERAL MAMMOGRAM WITH TOMOSYNTHESIS AND CAD
TECHNIQUE: Bilateral screening digital craniocaudal and mediolateral oblique
mammograms were obtained. Bilateral screening digital breast
tomosynthesis was performed. The images were evaluated with
computer-aided detection.

[L CC synth-2D]
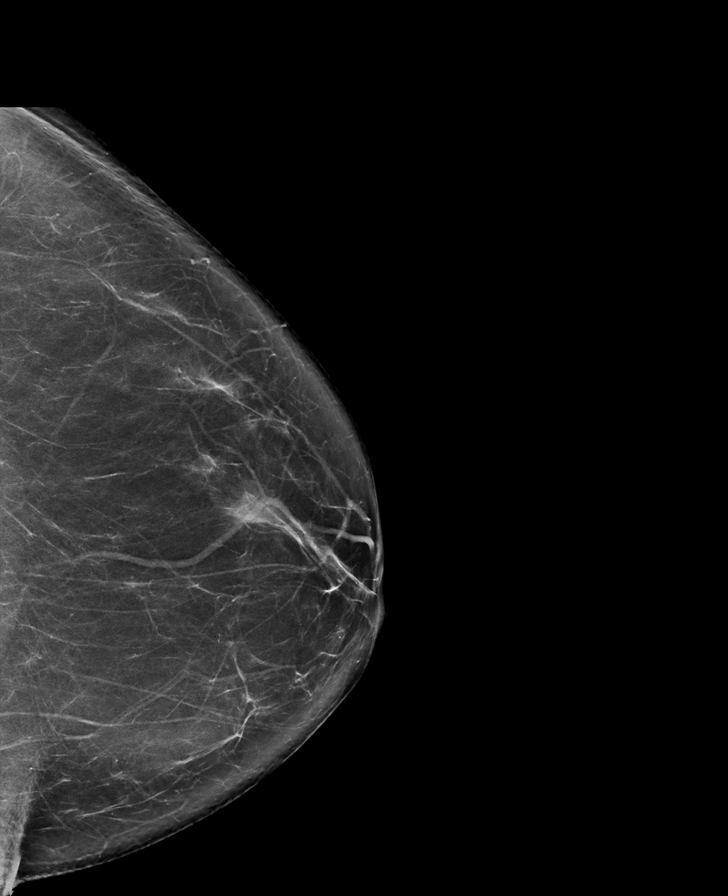

[L MLO synth-2D]
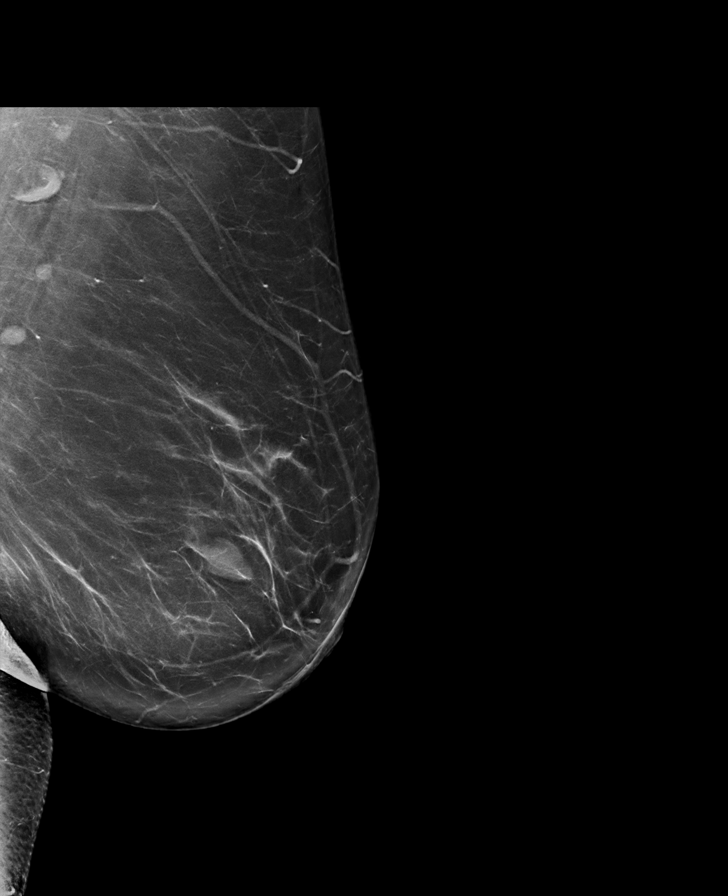

[R CC synth-2D]
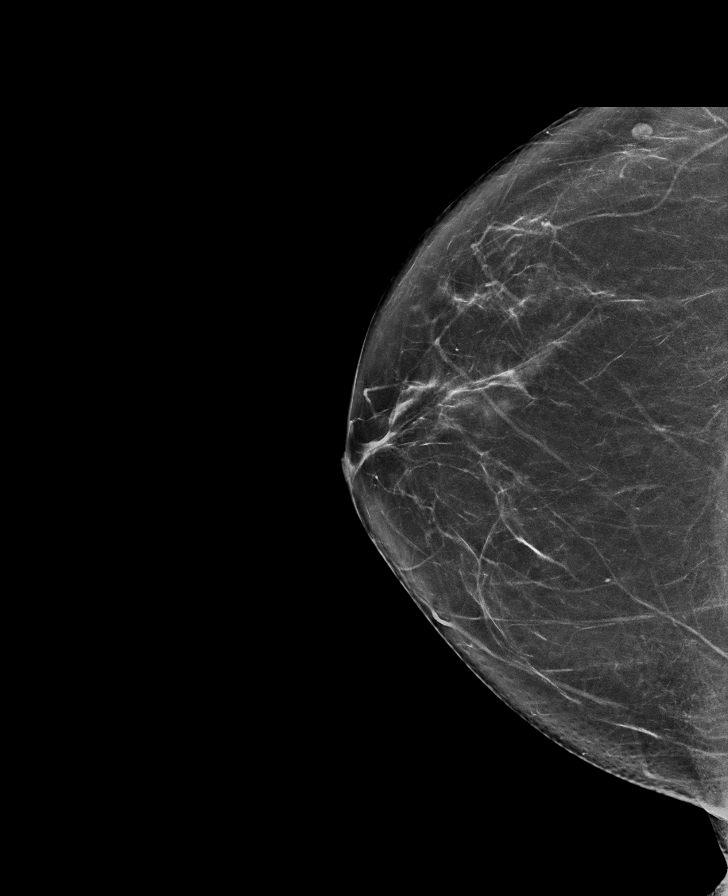

[R MLO synth-2D]
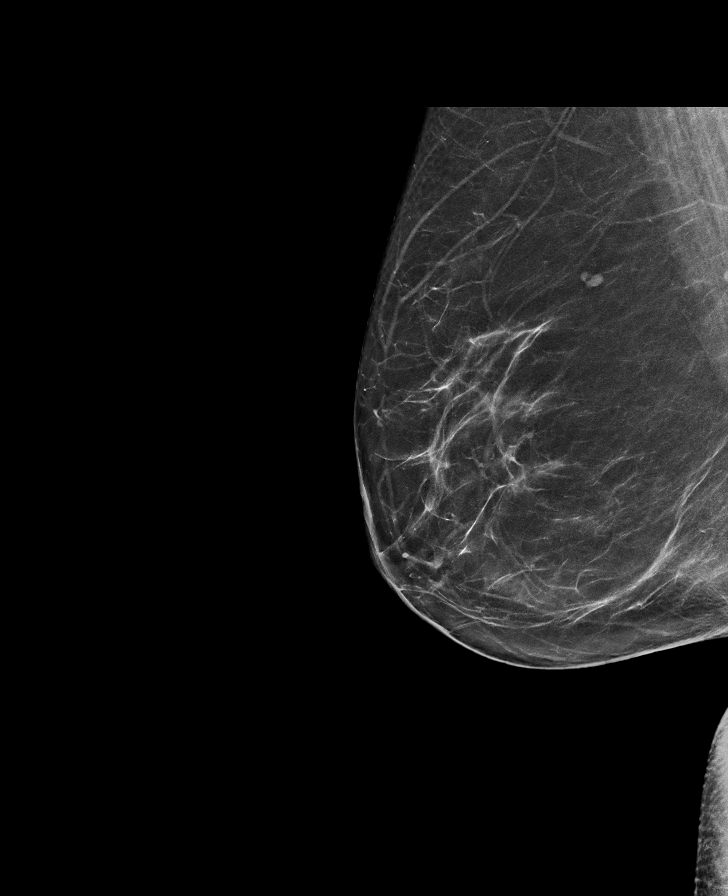

[L CC tomo · tomo slice 40/79.0]
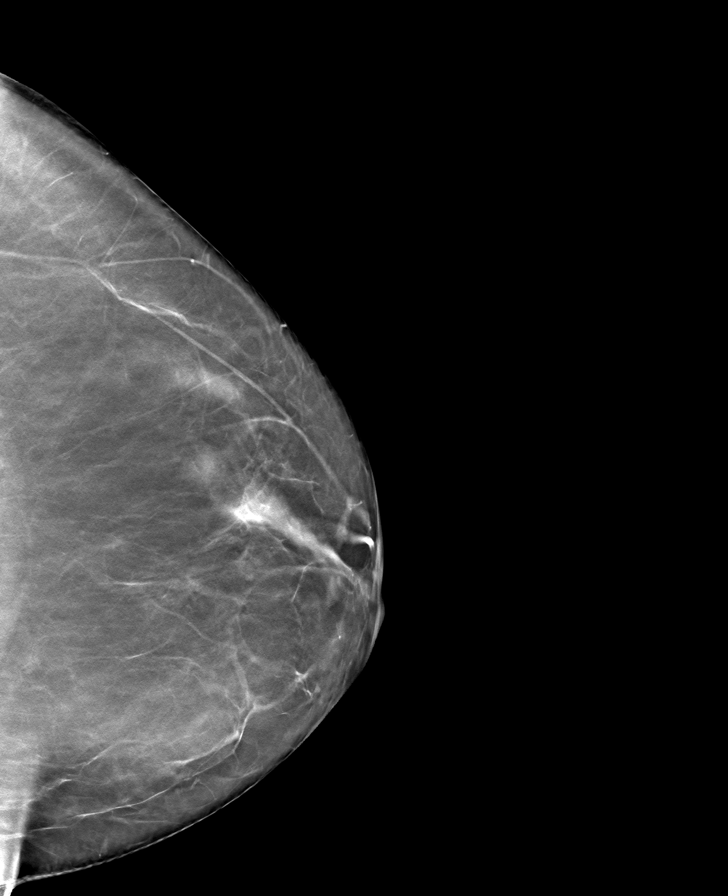

[R CC tomo · tomo slice 37/74.0]
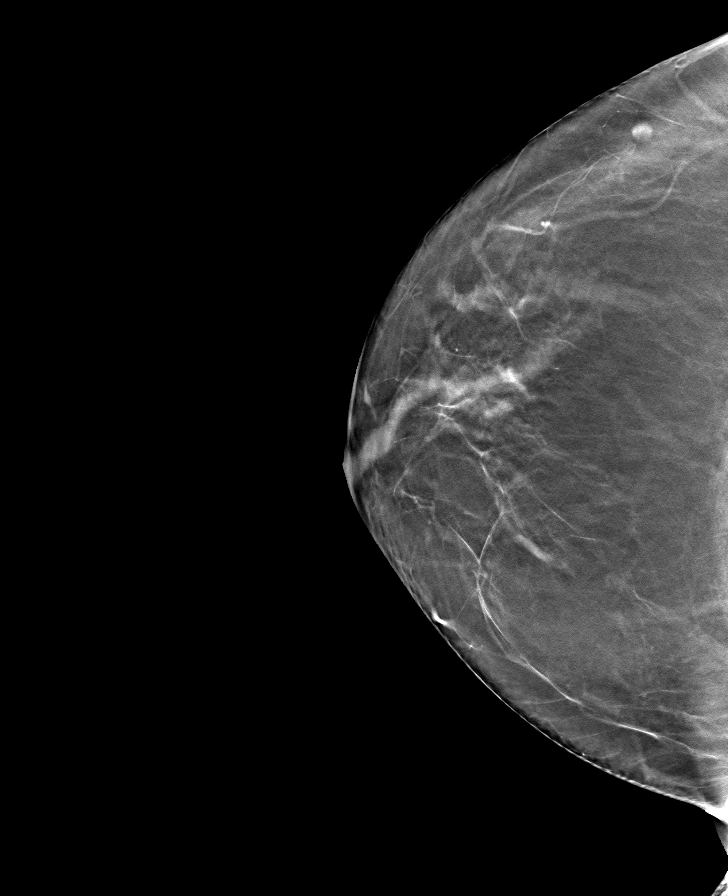

[R MLO tomo · tomo slice 41/80.0]
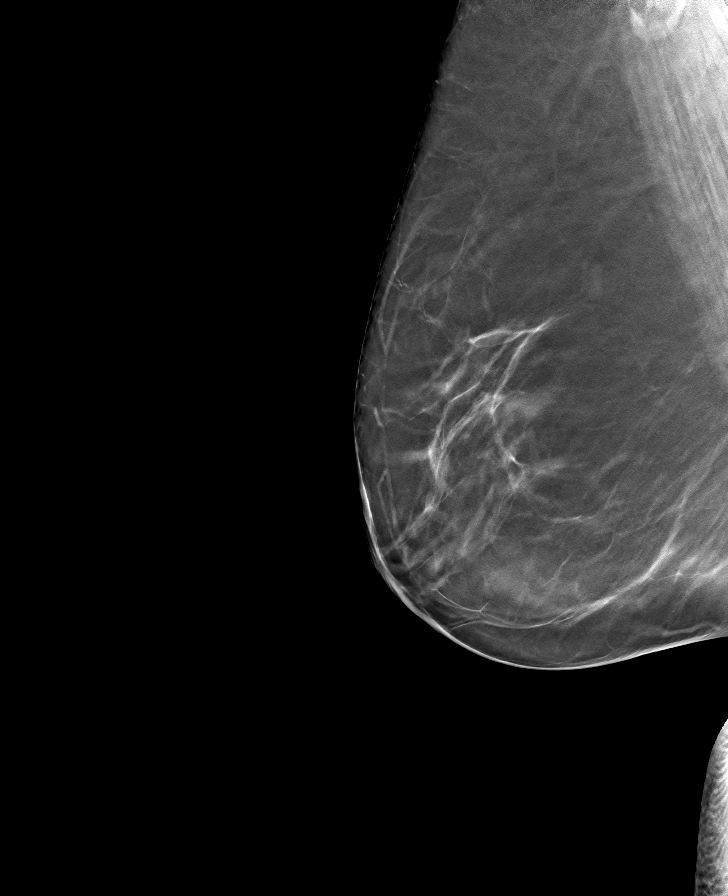

[L MLO tomo · tomo slice 46/91.0]
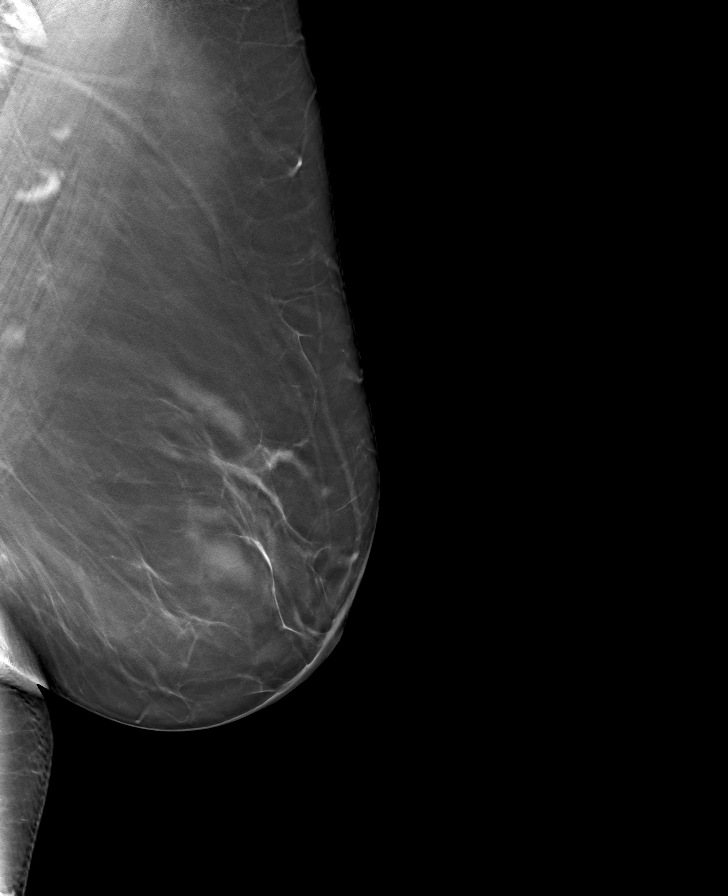

[8 of 24 positions shown; findings below may reference images not displayed]

ACR Breast Density Category b: There are scattered areas of
fibroglandular density.
FINDINGS: There are no findings suspicious for malignancy.
IMPRESSION: No mammographic evidence of malignancy. A result letter of this
screening mammogram will be mailed directly to the patient.

RECOMMENDATION:
Screening mammogram in one year. (Code:51-O-LD2)

BI-RADS CATEGORY  1: Negative.

## 2023-09-16 ENCOUNTER — Encounter: Payer: Self-pay | Admitting: Family Medicine

## 2023-09-28 ENCOUNTER — Encounter: Payer: Self-pay | Admitting: Family Medicine

## 2023-09-28 DIAGNOSIS — E038 Other specified hypothyroidism: Secondary | ICD-10-CM

## 2023-09-28 DIAGNOSIS — E785 Hyperlipidemia, unspecified: Secondary | ICD-10-CM

## 2023-09-28 DIAGNOSIS — R7303 Prediabetes: Secondary | ICD-10-CM

## 2023-10-01 ENCOUNTER — Other Ambulatory Visit: Payer: Managed Care, Other (non HMO)

## 2023-10-01 DIAGNOSIS — R7303 Prediabetes: Secondary | ICD-10-CM

## 2023-10-01 DIAGNOSIS — E785 Hyperlipidemia, unspecified: Secondary | ICD-10-CM

## 2023-10-01 DIAGNOSIS — E038 Other specified hypothyroidism: Secondary | ICD-10-CM

## 2023-10-02 LAB — CBC WITH DIFFERENTIAL/PLATELET
Basophils Absolute: 0.1 10*3/uL (ref 0.0–0.2)
Basos: 1 %
EOS (ABSOLUTE): 0.2 10*3/uL (ref 0.0–0.4)
Eos: 3 %
Hematocrit: 43.1 % (ref 34.0–46.6)
Hemoglobin: 14.5 g/dL (ref 11.1–15.9)
Immature Grans (Abs): 0 10*3/uL (ref 0.0–0.1)
Immature Granulocytes: 0 %
Lymphocytes Absolute: 1.8 10*3/uL (ref 0.7–3.1)
Lymphs: 34 %
MCH: 32.7 pg (ref 26.6–33.0)
MCHC: 33.6 g/dL (ref 31.5–35.7)
MCV: 97 fL (ref 79–97)
Monocytes Absolute: 0.3 10*3/uL (ref 0.1–0.9)
Monocytes: 6 %
Neutrophils Absolute: 3 10*3/uL (ref 1.4–7.0)
Neutrophils: 56 %
Platelets: 252 10*3/uL (ref 150–450)
RBC: 4.43 x10E6/uL (ref 3.77–5.28)
RDW: 12.2 % (ref 11.7–15.4)
WBC: 5.4 10*3/uL (ref 3.4–10.8)

## 2023-10-02 LAB — COMPREHENSIVE METABOLIC PANEL
ALT: 13 IU/L (ref 0–32)
AST: 17 IU/L (ref 0–40)
Albumin: 4.4 g/dL (ref 3.8–4.9)
Alkaline Phosphatase: 110 IU/L (ref 44–121)
BUN/Creatinine Ratio: 18 (ref 9–23)
BUN: 15 mg/dL (ref 6–24)
Bilirubin Total: 0.7 mg/dL (ref 0.0–1.2)
CO2: 26 mmol/L (ref 20–29)
Calcium: 9.5 mg/dL (ref 8.7–10.2)
Chloride: 104 mmol/L (ref 96–106)
Creatinine, Ser: 0.84 mg/dL (ref 0.57–1.00)
Globulin, Total: 2.6 g/dL (ref 1.5–4.5)
Glucose: 86 mg/dL (ref 70–99)
Potassium: 4.6 mmol/L (ref 3.5–5.2)
Sodium: 143 mmol/L (ref 134–144)
Total Protein: 7 g/dL (ref 6.0–8.5)
eGFR: 82 mL/min/{1.73_m2} (ref >59)

## 2023-10-02 LAB — LIPID PANEL
Chol/HDL Ratio: 2.1 ratio (ref 0.0–4.4)
Cholesterol, Total: 131 mg/dL (ref 100–199)
HDL: 61 mg/dL (ref >39)
LDL Chol Calc (NIH): 55 mg/dL (ref 0–99)
Triglycerides: 72 mg/dL (ref 0–149)
VLDL Cholesterol Cal: 15 mg/dL (ref 5–40)

## 2023-10-02 LAB — HEMOGLOBIN A1C
Est. average glucose Bld gHb Est-mCnc: 111 mg/dL
Hgb A1c MFr Bld: 5.5 % (ref 4.8–5.6)

## 2023-10-02 LAB — TSH RFX ON ABNORMAL TO FREE T4: TSH: 2.55 u[IU]/mL (ref 0.450–4.500)

## 2023-10-04 ENCOUNTER — Ambulatory Visit (INDEPENDENT_AMBULATORY_CARE_PROVIDER_SITE_OTHER): Payer: Managed Care, Other (non HMO) | Admitting: Family Medicine

## 2023-10-04 ENCOUNTER — Encounter: Payer: Self-pay | Admitting: Family Medicine

## 2023-10-04 VITALS — BP 108/73 | HR 62 | Temp 97.7°F | Ht 63.0 in | Wt 226.0 lb

## 2023-10-04 DIAGNOSIS — E785 Hyperlipidemia, unspecified: Secondary | ICD-10-CM

## 2023-10-04 DIAGNOSIS — T753XXA Motion sickness, initial encounter: Secondary | ICD-10-CM

## 2023-10-04 DIAGNOSIS — H832X9 Labyrinthine dysfunction, unspecified ear: Secondary | ICD-10-CM

## 2023-10-04 DIAGNOSIS — R112 Nausea with vomiting, unspecified: Secondary | ICD-10-CM

## 2023-10-04 DIAGNOSIS — E038 Other specified hypothyroidism: Secondary | ICD-10-CM | POA: Diagnosis not present

## 2023-10-04 DIAGNOSIS — R7303 Prediabetes: Secondary | ICD-10-CM | POA: Diagnosis not present

## 2023-10-04 MED ORDER — SCOPOLAMINE 1 MG/3DAYS TD PT72: 1.0000 | MEDICATED_PATCH | TRANSDERMAL | 0 refills | Status: DC

## 2023-10-04 MED ORDER — MECLIZINE HCL 25 MG PO TABS
25.0000 mg | ORAL_TABLET | Freq: Three times a day (TID) | ORAL | 0 refills | Status: DC | PRN
Start: 2023-10-04 — End: 2023-10-06

## 2023-10-04 NOTE — Progress Notes (Signed)
 Established Patient Office Visit  Subjective   Patient ID: Denise Gamble, female    DOB: Oct 02, 1965  Age: 58 y.o. MRN: [id number]  Chief Complaint  Patient presents with   Follow-up    HPI Denise Gamble is a 58 y.o. female presenting today for follow up of hyperlipidemia, prediabetes, hypothyroidism.  Managing well with nutrition, physical activity, and atorvastatin 40 mg daily, denies side effects. Denies hypoglycemic events, wounds or sores that are not healing well, increased thirst or urination. Denies fatigue, weight changes, heat/cold intolerance, skin/hair changes, bowel changes, CVS symptoms.  She does note that she will be going on a cruise but is apprehensive about potential motion sickness.  Outpatient Medications Prior to Visit  Medication Sig   ADVAIR HFA 45-21 MCG/ACT inhaler Inhale 2 puffs into the lungs 2 (two) times daily.   albuterol (VENTOLIN HFA) 108 (90 Base) MCG/ACT inhaler Inhale 1-2 puffs into the lungs every 6 (six) hours as needed for wheezing or shortness of breath. USE 2 INHALATIONS EVERY 4 HOURS AS NEEDED FOR WHEEZING OR SHORTNESS OF BREATH Strength: 108 (90 Base) MCG/ACT   Ascorbic Acid (VITAMIN C) 1000 MG tablet Take 1 tablet by mouth daily.   aspirin EC 81 MG EC tablet Take 1 tablet (81 mg total) by mouth daily.   atorvastatin (LIPITOR) 40 MG tablet Take 1 tablet (40 mg total) by mouth daily.   b complex vitamins capsule Take 1 capsule by mouth daily.   Biotin 300 MCG TABS Take by mouth.   Calcium-Vitamin D-Vitamin K (CALCIUM + D PO) Take 25 mg by mouth.   cetirizine (ZYRTEC) 10 MG tablet Take 10 mg by mouth daily.   cholecalciferol (VITAMIN D3) 25 MCG (1000 UNIT) tablet Take 1,000 Units by mouth daily.   Cyanocobalamin (B-12 PO) Take 12 mcg by mouth.   Inositol Niacinate 100 MG TABS Take by mouth.   MAGNESIUM COMPLEX HIGH POTENCY PO Take 500 mg by mouth.   montelukast (SINGULAIR) 10 MG tablet Take 1 tablet (10 mg total) by mouth at bedtime.    NIACIN PO Take 80 mg by mouth.   Omega-3 Fatty Acids (EQL OMEGA 3 FISH OIL) 1000 MG CPDR Take 1,000 mg by mouth daily.   PREVIDENT 5000 BOOSTER PLUS 1.1 % PSTE Place onto teeth.   Pyridoxine HCl (VITAMIN B-6 PO) Take 8.5 mg by mouth.   RIBOFLAVIN PO Take 6.5 mg by mouth.   Semaglutide-Weight Management (WEGOVY) 0.25 MG/0.5ML SOAJ Inject 0.25 mg into the skin once a week.   Thiamine Mononitrate (B1 PO) Take 6 mg by mouth.   [DISCONTINUED] Magnesium Citrate (CITROMA PO) Take 1,000 mg by mouth daily. liquid (Patient not taking: Reported on 10/04/2023)   [DISCONTINUED] vitamin B-12 (CYANOCOBALAMIN) 1000 MCG tablet Take 1,000 mcg by mouth daily. (Patient not taking: Reported on 10/04/2023)   No facility-administered medications prior to visit.    ROS Negative unless otherwise noted in HPI   Objective:     BP 108/73   Pulse 62   Temp 97.7 F (36.5 C) (Temporal)   Ht 5\' 3"  (1.6 m)   Wt 226 lb (102.5 kg)   LMP 02/10/2021 (Exact Date)   SpO2 99%   BMI 40.03 kg/m   Physical Exam Constitutional:      General: She is not in acute distress.    Appearance: Normal appearance.  HENT:     Head: Normocephalic and atraumatic.  Cardiovascular:     Rate and Rhythm: Normal rate and regular rhythm.  Heart sounds: No murmur heard.    No friction rub. No gallop.  Pulmonary:     Effort: Pulmonary effort is normal. No respiratory distress.     Breath sounds: No wheezing, rhonchi or rales.  Skin:    General: Skin is warm and dry.  Neurological:     Mental Status: She is alert and oriented to person, place, and time.      Assessment & Plan:  Hyperlipidemia, unspecified hyperlipidemia type Assessment & Plan: Last lipid panel: LDL 55, HDL 61, triglycerides 72.  Continue atorvastatin 40 mg daily.  CMP within normal limits/stable from last check.  Will continue to monitor.   Prediabetes Assessment & Plan: A1C 5.7, stable. Will continue to monitor.   Subclinical hypothyroidism Assessment  & Plan: Most recent TSH within normal limits. Will continue to monitor.   Vestibular disequilibrium, unspecified laterality -     Meclizine HCl; Take 1 tablet (25 mg total) by mouth 3 (three) times daily as needed for dizziness. Take 1 tablet (25 mg) every 6 to 8 hours as needed. Maximum 100 mg daily.  Dispense: 30 tablet; Refill: 0 -     Ambulatory referral to Neurology  Motion sickness, initial encounter -     Meclizine HCl; Take 1 tablet (25 mg total) by mouth 3 (three) times daily as needed for dizziness. Take 1 tablet (25 mg) every 6 to 8 hours as needed. Maximum 100 mg daily.  Dispense: 30 tablet; Refill: 0 -     Scopolamine; Place 1 patch (1.5 mg total) onto the skin every 3 (three) days.  Dispense: 5 patch; Refill: 0  Nausea and vomiting, unspecified vomiting type -     Scopolamine; Place 1 patch (1.5 mg total) onto the skin every 3 (three) days.  Dispense: 5 patch; Refill: 0  Patient has an upcoming appointment with ENT through [facility] also recommended follow-up with neurology with Dr. Sherryll Burger.  Neurology referral placed today.  Also providing prescriptions for meclizine and scopolamine for motion sickness given that patient has an upcoming [place] cruise.  Lipids, TSH, A1c, CBC, CMP within normal limits.  Return in about 6 months (around 04/02/2024) for annual physical, fasting labs 1 week before.    Denise Quitter, PA

## 2023-10-04 NOTE — Assessment & Plan Note (Signed)
 A1C 5.7, stable. Will continue to monitor.

## 2023-10-04 NOTE — Assessment & Plan Note (Signed)
 Last lipid panel: LDL 55, HDL 61, triglycerides 72.  Continue atorvastatin 40 mg daily.  CMP within normal limits/stable from last check.  Will continue to monitor.

## 2023-10-04 NOTE — Assessment & Plan Note (Signed)
 Most recent TSH within normal limits. Will continue to monitor.

## 2023-10-06 ENCOUNTER — Other Ambulatory Visit: Payer: Self-pay | Admitting: Family Medicine

## 2023-10-06 DIAGNOSIS — T753XXA Motion sickness, initial encounter: Secondary | ICD-10-CM

## 2023-10-06 DIAGNOSIS — H832X9 Labyrinthine dysfunction, unspecified ear: Secondary | ICD-10-CM

## 2023-10-06 MED ORDER — MECLIZINE HCL 25 MG PO TABS: ORAL_TABLET | ORAL | 0 refills | Status: DC

## 2023-11-16 ENCOUNTER — Encounter: Payer: Self-pay | Admitting: Family Medicine

## 2023-11-17 ENCOUNTER — Other Ambulatory Visit: Payer: Self-pay | Admitting: *Deleted

## 2023-11-17 DIAGNOSIS — J4520 Mild intermittent asthma, uncomplicated: Secondary | ICD-10-CM

## 2023-11-17 MED ORDER — ADVAIR HFA 45-21 MCG/ACT IN AERO: 2.0000 | INHALATION_SPRAY | Freq: Two times a day (BID) | RESPIRATORY_TRACT | 11 refills | Status: AC

## 2023-11-18 ENCOUNTER — Other Ambulatory Visit: Payer: Self-pay | Admitting: Family Medicine

## 2023-11-18 DIAGNOSIS — R112 Nausea with vomiting, unspecified: Secondary | ICD-10-CM

## 2023-11-18 DIAGNOSIS — T753XXA Motion sickness, initial encounter: Secondary | ICD-10-CM

## 2023-11-18 DIAGNOSIS — H832X9 Labyrinthine dysfunction, unspecified ear: Secondary | ICD-10-CM

## 2023-11-18 MED ORDER — SCOPOLAMINE 1 MG/3DAYS TD PT72: 1.0000 | MEDICATED_PATCH | TRANSDERMAL | 1 refills | Status: AC

## 2023-11-18 MED ORDER — MECLIZINE HCL 25 MG PO TABS: ORAL_TABLET | ORAL | 0 refills | Status: AC

## 2023-12-03 ENCOUNTER — Ambulatory Visit: Payer: Managed Care, Other (non HMO) | Admitting: Internal Medicine

## 2023-12-29 ENCOUNTER — Telehealth: Payer: Self-pay | Admitting: *Deleted

## 2023-12-29 NOTE — Telephone Encounter (Signed)
 LVM for pt to call to schedule her physical with the new provider.  See schedule details below and schedule accordingly.    Return in about 6 months (around 04/02/2024) for annual physical, fasting labs 1 week before.

## 2024-02-04 ENCOUNTER — Telehealth: Payer: Self-pay | Admitting: Internal Medicine

## 2024-02-04 DIAGNOSIS — K900 Celiac disease: Secondary | ICD-10-CM

## 2024-02-04 NOTE — Telephone Encounter (Signed)
 As per my last office note, tissue transglutaminase antibody IgA. Thanks, JP

## 2024-02-04 NOTE — Telephone Encounter (Signed)
 See note below. What labs would you like pt to have prior to office visit?

## 2024-02-04 NOTE — Telephone Encounter (Signed)
 Order in epic, pt knows she can come to have labs drawn.

## 2024-02-04 NOTE — Telephone Encounter (Signed)
 Patient called and stated that she would like to have labs order before she come to see Dr. Abran for her appointment on July 7 th. Please advise.

## 2024-02-04 NOTE — Telephone Encounter (Signed)
 Ok great just wanted to make sure you didn't want anything additional.

## 2024-02-09 ENCOUNTER — Other Ambulatory Visit (INDEPENDENT_AMBULATORY_CARE_PROVIDER_SITE_OTHER)

## 2024-02-09 DIAGNOSIS — K900 Celiac disease: Secondary | ICD-10-CM | POA: Diagnosis not present

## 2024-02-10 LAB — TISSUE TRANSGLUTAMINASE, IGA: (tTG) Ab, IgA: 1.5 U/mL

## 2024-02-11 ENCOUNTER — Ambulatory Visit: Payer: Self-pay | Admitting: Internal Medicine

## 2024-02-16 ENCOUNTER — Ambulatory Visit: Admitting: Internal Medicine

## 2024-02-17 ENCOUNTER — Encounter: Payer: Self-pay | Admitting: Internal Medicine

## 2024-02-17 ENCOUNTER — Ambulatory Visit (INDEPENDENT_AMBULATORY_CARE_PROVIDER_SITE_OTHER): Admitting: Internal Medicine

## 2024-02-17 VITALS — BP 122/78 | HR 69 | Ht 63.0 in | Wt 217.0 lb

## 2024-02-17 DIAGNOSIS — K900 Celiac disease: Secondary | ICD-10-CM

## 2024-02-17 DIAGNOSIS — Z800 Family history of malignant neoplasm of digestive organs: Secondary | ICD-10-CM

## 2024-02-17 DIAGNOSIS — R748 Abnormal levels of other serum enzymes: Secondary | ICD-10-CM | POA: Diagnosis not present

## 2024-02-17 DIAGNOSIS — E669 Obesity, unspecified: Secondary | ICD-10-CM

## 2024-02-17 DIAGNOSIS — R7989 Other specified abnormal findings of blood chemistry: Secondary | ICD-10-CM

## 2024-02-17 DIAGNOSIS — K8681 Exocrine pancreatic insufficiency: Secondary | ICD-10-CM

## 2024-02-17 DIAGNOSIS — Z6839 Body mass index (BMI) 39.0-39.9, adult: Secondary | ICD-10-CM

## 2024-02-17 DIAGNOSIS — K760 Fatty (change of) liver, not elsewhere classified: Secondary | ICD-10-CM | POA: Diagnosis not present

## 2024-02-17 DIAGNOSIS — Z860101 Personal history of adenomatous and serrated colon polyps: Secondary | ICD-10-CM

## 2024-02-17 NOTE — Progress Notes (Signed)
 HISTORY OF PRESENT ILLNESS:  Denise Gamble is a 58 y.o. female with past medical history as listed below.  She presents today regarding follow-up of celiac disease diagnosed November 30, 2021, mildly fluctuating elevation of alkaline phosphatase, fatty liver, and a personal history of adenomatous colon polyps as well as a family history of colon cancer.  She was last seen in this office September 01, 2022.  The impression at that time as below:  ASSESSMENT:   1.  Celiac disease.  Baseline tissue transglutaminase antibody IgA 22.8.  Upper endoscopy April 2023 with  MARSH IIIA lesion.  Now on gluten-free diet.  Most recent TTG antibody 3 which supports gluten free compliance. 2.  Mild fluctuating elevation of alkaline phosphatase.  This is either due to fatty liver or possibly celiac.  Ongoing.  Last alkaline phosphatase July 2023 was 144.  Stable 3.  Obesity.  BMI 39.3 4.  General medical problems 5.  History of adenomatous colon polyp December 2021. 6.  Family history of colon cancer in parent-mother, advanced age     PLAN:   1.  Detailed discussion on celiac disease.  Multiple questions answered to patient's satisfaction. 2.  Continue strict gluten-free diet 3.  MiraLAX for constipation.  Titrate to need 4.  Graduated exercise and weight loss for fatty liver and overall health 5.  Repeat tissue transglutaminase antibody IgA in 1 year 6.  Office follow-up in 1 year 7.  Surveillance colonoscopy around December 2026 8.  Recommended her seeing a neurologist regarding issues with neuropathy 9.  Recommending [facility] for primary care physician transition 10.  Note provided regarding her medical condition (celiac disease) and the need for special dietary considerations    From a GI standpoint she has been doing well.  Still has issues with constipation.  Her most recent tissue transglutaminase antibody was normal at 1.5.  Her last liver panel revealed normal liver test  including alkaline phosphatase.    REVIEW OF SYSTEMS:  All non-GI ROS negative except for headaches, dizziness, urinary leakage  Past Medical History:  Diagnosis Date   Allergy    seasonal allergies   Asthma    adult onset (1998)   Celiac disease 08/10/2021   GERD (gastroesophageal reflux disease)    for PRN use   History of MRSA infection    Miscarriage 2013   multiple    Paresthesia of lower extremity    Peripheral neuropathy 2019   left foot   Plantar fasciitis of left foot    Thoracic outlet syndrome    TIA (transient ischemic attack) 09/11/2021   Urinary incontinence     Past Surgical History:  Procedure Laterality Date   DILATION AND CURETTAGE OF UTERUS  2011   LYMPHADENECTOMY Left 1979   WISDOM TOOTH EXTRACTION      Social History Denise Gamble  reports that she has never smoked. She has never been exposed to tobacco smoke. She has never used smokeless tobacco. She reports that she does not drink alcohol and does not use drugs.  family history includes Alcohol abuse in her brother; Alzheimer's disease in her maternal aunt; Brain cancer (age of onset: 60) in her sister; Colon cancer (age of onset: 37) in her father; Colon polyps in her brother and mother; Colon polyps (age of onset: 41) in her brother; Colon polyps (age of onset: 67) in her father; Coronary artery disease in her brother; Dementia in her mother; Diabetes in her brother; Diverticulitis in her mother; Heart attack  in her maternal grandfather; Heart disease in her brother; Heart disease (age of onset: 95) in her mother; Hyperlipidemia in her father and sister; Hypertension in her brother, father, sister, sister, and sister; Leukemia in her father; Lung cancer (age of onset: 40) in her sister; Osteoporosis in her maternal grandmother, paternal grandmother, and sister; Prostate cancer in her brother; Skin cancer in her father; Stroke (age of onset: 43) in her sister.  Allergies  Allergen Reactions    Pollen Extract Hives    All tree pollens and dust mites   Latex Hives   Gluten Meal    Other     Seasonal:Grass, most trees, ragweed   Wheat        PHYSICAL EXAMINATION: Vital signs: BP 122/78   Pulse 69   Ht 5' 3 (1.6 m)   Wt 217 lb (98.4 kg)   LMP 02/10/2021 (Exact Date)   BMI 38.44 kg/m   Constitutional: generally well-appearing, no acute distress Psychiatric: alert and oriented x3, cooperative Eyes: extraocular movements intact, anicteric, conjunctiva pink Mouth: oral pharynx moist, no lesions Neck: supple no lymphadenopathy Cardiovascular: heart regular rate and rhythm, no murmur Lungs: clear to auscultation bilaterally Abdomen: soft, nontender, nondistended, no obvious ascites, no peritoneal signs, normal bowel sounds, no organomegaly Rectal: Omitted Extremities: no clubbing, cyanosis, or lower extremity edema bilaterally Skin: no lesions on visible extremities Neuro: No focal deficits.  Cranial nerves intact  ASSESSMENT:   1.  Celiac disease.  Baseline tissue transglutaminase antibody IgA 22.8.  Upper endoscopy April 2023 with  MARSH IIIA lesion.  Now on gluten-free diet.  Most recent TTG antibody 1.5 which supports gluten free compliance. 2.  Mild fluctuating elevation of alkaline phosphatase.  This is either due to fatty liver or possibly celiac.  Ongoing.  Last alkaline phosphatase was normal 3.  Obesity.  BMI 38.44 4.  General medical problems 5.  History of adenomatous colon polyp December 2021. 6.  Family history of colon cancer in parent-mother, advanced age     PLAN:   1.  Discussion on celiac disease.  Multiple questions answered to patient's satisfaction. 2.  Continue strict gluten-free diet 3.  MiraLAX for constipation.  Titrate to need 4.  Graduated exercise and weight loss for fatty liver and overall health 5.  Repeat tissue transglutaminase antibody IgA in 1 year 6.  Office follow-up in 1 year 7.  Surveillance colonoscopy around December  2026 8.  Ongoing general medical care PCP Total time of 30 minutes was spent preparing to see the patient, obtaining history, performing medically appropriate physical exam, counseling and educating patient regarding the above listed issues, ordering blood work and clinical follow-up, and documenting clinical information in the health record

## 2024-02-17 NOTE — Patient Instructions (Signed)
 Follow up as needed.  _______________________________________________________  If your blood pressure at your visit was 140/90 or greater, please contact your primary care physician to follow up on this.  _______________________________________________________  If you are age 56 or older, your body mass index should be between 23-30. Your Body mass index is 38.44 kg/m. If this is out of the aforementioned range listed, please consider follow up with your Primary Care Provider.  If you are age 27 or younger, your body mass index should be between 19-25. Your Body mass index is 38.44 kg/m. If this is out of the aformentioned range listed, please consider follow up with your Primary Care Provider.   ________________________________________________________  The [facility] providers would like to encourage you to use MYCHART to communicate with providers for non-urgent requests or questions.  Due to long hold times on the telephone, sending your provider a message by [facility] may be a faster and more efficient way to get a response.  Please allow 48 business hours for a response.  Please remember that this is for non-urgent requests.  _______________________________________________________   It was a pleasure to see you today!  Thank you for trusting me with your gastrointestinal care!

## 2024-04-18 ENCOUNTER — Other Ambulatory Visit: Payer: Self-pay | Admitting: Family Medicine

## 2024-04-18 ENCOUNTER — Encounter: Payer: Self-pay | Admitting: Family Medicine

## 2024-04-18 DIAGNOSIS — Z1231 Encounter for screening mammogram for malignant neoplasm of breast: Secondary | ICD-10-CM

## 2024-05-09 ENCOUNTER — Ambulatory Visit
Admission: RE | Admit: 2024-05-09 | Discharge: 2024-05-09 | Disposition: A | Discharge status: Home / Self Care | Source: Ambulatory Visit | Attending: Family Medicine | Admitting: Family Medicine

## 2024-05-09 DIAGNOSIS — Z1231 Encounter for screening mammogram for malignant neoplasm of breast: Secondary | ICD-10-CM | POA: Insufficient documentation

## 2024-05-15 ENCOUNTER — Ambulatory Visit: Payer: Self-pay | Admitting: Family Medicine

## 2024-05-29 ENCOUNTER — Other Ambulatory Visit: Payer: Self-pay | Admitting: Family Medicine

## 2024-05-29 DIAGNOSIS — J302 Other seasonal allergic rhinitis: Secondary | ICD-10-CM

## 2024-05-29 NOTE — Telephone Encounter (Signed)
 Scheduled to come in for [facility] 07/05/24 and [facility] 07/12/24

## 2024-06-30 ENCOUNTER — Other Ambulatory Visit: Payer: Self-pay | Admitting: Family Medicine

## 2024-06-30 DIAGNOSIS — E785 Hyperlipidemia, unspecified: Secondary | ICD-10-CM

## 2024-07-03 ENCOUNTER — Other Ambulatory Visit: Payer: Self-pay | Admitting: Family Medicine

## 2024-07-03 DIAGNOSIS — R7303 Prediabetes: Secondary | ICD-10-CM

## 2024-07-03 DIAGNOSIS — E785 Hyperlipidemia, unspecified: Secondary | ICD-10-CM

## 2024-07-03 DIAGNOSIS — E038 Other specified hypothyroidism: Secondary | ICD-10-CM

## 2024-07-05 ENCOUNTER — Other Ambulatory Visit: Payer: Self-pay | Admitting: Family Medicine

## 2024-07-05 ENCOUNTER — Other Ambulatory Visit

## 2024-07-05 DIAGNOSIS — E785 Hyperlipidemia, unspecified: Secondary | ICD-10-CM

## 2024-07-05 DIAGNOSIS — K900 Celiac disease: Secondary | ICD-10-CM

## 2024-07-05 DIAGNOSIS — R7989 Other specified abnormal findings of blood chemistry: Secondary | ICD-10-CM

## 2024-07-05 DIAGNOSIS — R7303 Prediabetes: Secondary | ICD-10-CM

## 2024-07-05 DIAGNOSIS — Z789 Other specified health status: Secondary | ICD-10-CM

## 2024-07-05 DIAGNOSIS — E038 Other specified hypothyroidism: Secondary | ICD-10-CM

## 2024-07-06 LAB — IRON,TIBC AND FERRITIN PANEL
Ferritin: 122 ng/mL (ref 15–150)
Iron Saturation: 28 % (ref 15–55)
Iron: 66 ug/dL (ref 27–159)
Total Iron Binding Capacity: 235 ug/dL — ABNORMAL LOW (ref 250–450)
UIBC: 169 ug/dL (ref 131–425)

## 2024-07-06 LAB — COMPREHENSIVE METABOLIC PANEL WITH GFR
ALT: 14 IU/L (ref 0–32)
AST: 20 IU/L (ref 0–40)
Albumin: 4.1 g/dL (ref 3.8–4.9)
Alkaline Phosphatase: 120 IU/L (ref 49–135)
BUN/Creatinine Ratio: 16 (ref 9–23)
BUN: 13 mg/dL (ref 6–24)
Bilirubin Total: 0.6 mg/dL (ref 0.0–1.2)
CO2: 25 mmol/L (ref 20–29)
Calcium: 9.5 mg/dL (ref 8.7–10.2)
Chloride: 104 mmol/L (ref 96–106)
Creatinine, Ser: 0.8 mg/dL (ref 0.57–1.00)
Globulin, Total: 2.6 g/dL (ref 1.5–4.5)
Glucose: 84 mg/dL (ref 70–99)
Potassium: 4.5 mmol/L (ref 3.5–5.2)
Sodium: 141 mmol/L (ref 134–144)
Total Protein: 6.7 g/dL (ref 6.0–8.5)
eGFR: 86 mL/min/1.73 (ref >59)

## 2024-07-06 LAB — HEMOGLOBIN A1C
Est. average glucose Bld gHb Est-mCnc: 105 mg/dL
Hgb A1c MFr Bld: 5.3 % (ref 4.8–5.6)

## 2024-07-06 LAB — LIPID PANEL
Chol/HDL Ratio: 2.3 ratio (ref 0.0–4.4)
Cholesterol, Total: 136 mg/dL (ref 100–199)
HDL: 58 mg/dL (ref >39)
LDL Chol Calc (NIH): 65 mg/dL (ref 0–99)
Triglycerides: 64 mg/dL (ref 0–149)
VLDL Cholesterol Cal: 13 mg/dL (ref 5–40)

## 2024-07-06 LAB — TSH: TSH: 2.18 u[IU]/mL (ref 0.450–4.500)

## 2024-07-10 ENCOUNTER — Ambulatory Visit: Payer: Self-pay | Admitting: Family Medicine

## 2024-07-12 ENCOUNTER — Encounter: Admitting: Family Medicine

## 2024-07-19 ENCOUNTER — Ambulatory Visit: Admitting: Family Medicine

## 2024-07-19 VITALS — BP 120/82 | HR 83 | Ht 63.0 in | Wt 224.8 lb

## 2024-07-19 DIAGNOSIS — Z0000 Encounter for general adult medical examination without abnormal findings: Secondary | ICD-10-CM | POA: Diagnosis not present

## 2024-07-19 DIAGNOSIS — L299 Pruritus, unspecified: Secondary | ICD-10-CM | POA: Insufficient documentation

## 2024-07-19 DIAGNOSIS — H832X9 Labyrinthine dysfunction, unspecified ear: Secondary | ICD-10-CM

## 2024-07-19 DIAGNOSIS — K900 Celiac disease: Secondary | ICD-10-CM

## 2024-07-19 DIAGNOSIS — Z23 Encounter for immunization: Secondary | ICD-10-CM | POA: Diagnosis not present

## 2024-07-19 DIAGNOSIS — E785 Hyperlipidemia, unspecified: Secondary | ICD-10-CM

## 2024-07-19 NOTE — Assessment & Plan Note (Signed)
-   Presents with right-sided ear itching and some flaking. No objective findings of otitis externa, eczema, or excessive cerumen on exam. The differential includes dry skin, early eczema, or eustachian tube dysfunction. - Plan to try conservative measures first.

## 2024-07-19 NOTE — Patient Instructions (Signed)
 It was nice to see you today,  We addressed the following topics today: - For the ear itch, you can try applying a very small amount of Vaseline or over-the-counter hydrocortisone  cream to the opening of the ear canal with a Q-tip. Do not insert it deep into the canal. - You can also try using Flonase nasal spray daily, which can sometimes help with ear symptoms related to pressure or allergies. - I would like you to cut back your iron supplement to once a day. - I have ordered a bone density scan for you. The imaging center will contact you to schedule. - Continue your Lipitor  at the current dose of 40mg  daily. We will not make changes at this time. - I will see you for your next annual physical next year unless any new concerns arise before then.  Have a great day,  Rolan Slain, MD

## 2024-07-19 NOTE — Assessment & Plan Note (Signed)
-   Diagnosed in 2023. Adherent to a strict gluten-free, pescatarian diet. History of nutritional deficiencies including iron and erratic B12 levels. - Labs from recent visit were normal, except for low total iron binding capacity, consistent with saturated iron stores. Ferritin was normal. - Reduce iron supplementation to once daily. - Will check B12 level at next lab draw. - Order bone density scan (DEXA) due to increased risk associated with celiac disease. Will submit for prior authorization if needed. - Follow up annually for physical if no new issues arise. - Colonoscopy is due in 2026. - Pap smear not due until 2028.

## 2024-07-19 NOTE — Assessment & Plan Note (Signed)
-   On atorvastatin  40mg  daily secondary to history of possible TIA and cerebral small vessel disease. LDL is 65. Discussed risks/benefits of statins and potential for dose reduction. She expressed concern about long-term side effects but understands the cardiovascular benefits. - Continue atorvastatin  40mg  daily. No changes at this time.

## 2024-07-19 NOTE — Assessment & Plan Note (Signed)
-   History of significant vertigo, now well-controlled on betahistine. 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 Also had resolution of tinnitus. - Continue betahistine as taken.

## 2024-07-19 NOTE — Progress Notes (Unsigned)
 Annual physical  Subjective    Patient ID: Denise Gamble, female    DOB: March 13, 1966  Age: 58 y.o. MRN: [id number]  Chief Complaint  Patient presents with   Annual Exam   HPI Denise Gamble is a 58 y.o. old female here  for annual exam.   Subjective - Right ear itching. Occurs this time of year. Reports itching inside the ear canal with some yellow flakes, which is unusual. No sensation of fullness. Admits to manipulating the ear frequently.  - Follow-up on multiple chronic issues. History of possible TIA vs. chronic painless migraines. Also has burning legs, suspected to be related to celiac disease. Discussed medication management for cholesterol and vertigo.  Medications Medications discussed include: Lipitor  (atorvastatin ) 40mg  daily for hyperlipidemia, reports good tolerance but has some leg weakness/exhaustion, unsure if related. Betahistine taken at least once daily for vestibular symptoms/vertigo, reports significant improvement in dizziness, particularly with motion. Singulair  (montelukast ) taken over the counter. Inhalers for asthma, used infrequently, only with illness. Takes iron supplements twice daily for low ferritin, will reduce to once daily. Takes various vitamins for nutritional deficiencies related to celiac disease, including B12.  PMH, PSH, FH, Social Hx PMHx: Celiac disease diagnosed in 2023, follows a strict gluten-free and pescatarian diet. History of a possible TIA in 2023, now thought to potentially be an onset of chronic painless migraines. Vestibular dysfunction. Chronic burning legs since 05/2019. Asthma, not requiring frequent inhaler use. Hyperlipidemia. 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 History of slow metabolism. PSH: None mentioned. FH: None mentioned. Social Hx: Sees multiple specialists including ENT, neurology, and gastroenterology at [facility]. Sees a nutritionist for dietary management of celiac disease. Reports  trying [facility] for weight loss with minimal success and side effect of fatigue. Drinks regular Dr. Nunzio, discussed switching to diet. Walks a lot for exercise.  ROS HEENT: Reports R ear itching and tinnitus (resolved with betahistine). Denies ear fullness. Neurology: Reports history of vertigo/dizziness (improved with betahistine) and burning legs. Reports leg weakness/exhaustion. Constitutional: Denies fever. Reports fatigue with Wegovy (no longer taking). Reports feeling much better since starting a gluten-free diet. MSK: Denies muscle aches from statin.  The ASCVD Risk score (Arnett DK, et al., 2019) failed to calculate for the following reasons:   Risk score cannot be calculated because patient has a medical history suggesting prior/existing ASCVD  Health Maintenance Due  Topic Date Due   Hepatitis B Vaccines 19-59 Average Risk (1 of 3 - 19+ 3-dose series) Never done   Pneumococcal Vaccine: 50+ Years (2 of 2 - PCV) 12/14/2017   Influenza Vaccine  03/10/2024   COVID-19 Vaccine (3 - 2025-26 season) 04/10/2024      Objective:     BP 120/82   Pulse 83   Ht 5' 3 (1.6 m)   Wt 224 lb 12.8 oz (102 kg)   LMP 02/10/2021 (Exact Date)   SpO2 99%   BMI 39.82 kg/m  {Vitals History (Optional):23777}  Physical Exam Gen: alert, oriented HEENT: External ear and ear canal appear normal. No evidence of dryness, eczema, or discharge. CV: rrr, no murmur Pulm: lctab. No wheeze or crackles.  GI: soft, nbs.  Nontender to palpation MSK: strength equal b/l. Normal gait Ext: no pedal edema Skin: warm and dry, no rashes Psych: pleasant affect.  Spontaneous speech   No results found for any visits on 07/19/24.      Assessment & Plan:   Physical exam, annual  Flu vaccine need -  Flu vaccine trivalent PF, 6mos and older(Flulaval,Afluria,Fluarix,Fluzone)  Hyperlipidemia, unspecified hyperlipidemia type Assessment & Plan: - On atorvastatin  40mg  daily secondary to history of possible  TIA and cerebral small vessel disease. LDL is 65. Discussed risks/benefits of statins and potential for dose reduction. She expressed concern about long-term side effects but understands the cardiovascular benefits. - Continue atorvastatin  40mg  daily. No changes at this time.   Celiac disease Assessment & Plan: - Diagnosed in 2023. Adherent to a strict gluten-free, pescatarian diet. History of nutritional deficiencies including iron and erratic B12 levels. - Labs from recent visit were normal, except for low total iron binding capacity, consistent with saturated iron stores. Ferritin was normal. - Reduce iron supplementation to once daily. - Will check B12 level at next lab draw. - Order bone density scan (DEXA) due to increased risk associated with celiac disease. Will submit for prior authorization if needed. - Follow up annually for physical if no new issues arise. - Colonoscopy is due in 2026. - Pap smear not due until 2028.   Vestibular disequilibrium, unspecified laterality Assessment & Plan: - History of significant vertigo, now well-controlled on betahistine. 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 Also had resolution of tinnitus. - Continue betahistine as taken.   Ear itch Assessment & Plan: - Presents with right-sided ear itching and some flaking. No objective findings of otitis externa, eczema, or excessive cerumen on exam. The differential includes dry skin, early eczema, or eustachian tube dysfunction. - Plan to try conservative measures first.      Return in about 1 year (around 07/19/2025) for physical.    Toribio MARLA Slain, MD

## 2024-07-24 DIAGNOSIS — Z23 Encounter for immunization: Secondary | ICD-10-CM | POA: Diagnosis not present

## 2024-07-31 ENCOUNTER — Other Ambulatory Visit

## 2024-07-31 ENCOUNTER — Ambulatory Visit: Payer: Self-pay

## 2024-07-31 NOTE — Telephone Encounter (Signed)
 FYI Only or Action Required?: FYI only for provider: appointment scheduled on fyi only.  Patient was last seen in primary care on 07/19/2024 by Chandra Toribio POUR, MD.  Called Nurse Triage reporting Sore Throat.  Symptoms began Saturday.  Interventions attempted: Nothing.  Symptoms are: gradually worsening.  Triage Disposition: See PCP When Office is Open (Within 3 Days)  Patient/caregiver understands and will follow disposition?: Yes    Copied from CRM #[id number]. Topic: Clinical - Red Word Triage >> Jul 31, 2024  4:41 PM Victoria B wrote: [facility] that prompted transfer to Nurse Triage: patient has extreme fatigue,  cough, and congestion, sore throat Reason for Disposition  [1] Sore throat is the only symptom AND [2] present > 48 hours  Answer Assessment - Initial Assessment Questions 1. ONSET: When did the throat start hurting? (Hours or days ago)      Saturday 2. SEVERITY: How bad is the sore throat? (Scale 1-10; mild, moderate or severe)     moderate 3. STREP EXPOSURE: Has there been any exposure to strep within the past week? If Yes, ask: What type of contact occurred?      na 4.  VIRAL SYMPTOMS: Are there any symptoms of a cold, such as a runny nose, cough, hoarse voice or red eyes?      cough 5. FEVER: Do you have a fever? If Yes, ask: What is your temperature, how was it measured, and when did it start?     no 6. PUS ON THE TONSILS: Is there pus on the tonsils in the back of your throat?     no 7. OTHER SYMPTOMS: Do you have any other symptoms? (e.g., difficulty breathing, headache, rash)     Headache, fatigue, bilateral ear irritation & fluid, pain in left ear,  8. PREGNANCY: Is there any chance you are pregnant? When was your last menstrual period?     Na  Pt stated since available appt are so far away she is going to try to get an appt closer with another place  Protocols used: Sore Throat-A-AH

## 2024-09-05 ENCOUNTER — Encounter: Payer: Self-pay | Admitting: Family Medicine

## 2024-09-05 ENCOUNTER — Ambulatory Visit: Payer: Self-pay

## 2024-09-05 ENCOUNTER — Ambulatory Visit: Admitting: Family Medicine

## 2024-09-05 VITALS — BP 112/75 | HR 74 | Ht 63.0 in | Wt 234.4 lb

## 2024-09-05 DIAGNOSIS — H8111 Benign paroxysmal vertigo, right ear: Secondary | ICD-10-CM | POA: Insufficient documentation

## 2024-09-05 NOTE — Assessment & Plan Note (Signed)
 Symptoms consistent with BPPV due to displaced otoliths. Condition self-limiting but can persist. Epley maneuver effective for symptom relief. Medications like betahistine may provide additional relief. - Refer to [facility] for vestibular rehabilitation and Epley maneuver instruction. - Continue betahistine three times daily. - Advise emergency care if dizziness becomes constant or is accompanied by significant ear pain. - Encourage her to call [facility] to expedite scheduling.

## 2024-09-05 NOTE — Telephone Encounter (Signed)
 FYI Only or Action Required?: FYI only for provider: appointment scheduled on 09/05/24.  Patient was last seen in primary care on 07/19/2024 by Chandra Toribio POUR, MD.  Called Nurse Triage reporting Dizziness.  Symptoms began yesterday.  Interventions attempted: Prescription medications: dramamine; betahistine HCl.  Symptoms are: unchanged.  Triage Disposition: See Physician Within 24 Hours  Patient/caregiver understands and will follow disposition?: Yes     Message from Victor F sent at 09/05/2024 12:26 PM EST  Reason for Triage: Patient having dizziness     Reason for Disposition  [1] MODERATE dizziness (e.g., interferes with normal activities) AND [2] has NOT been evaluated by doctor (or NP/PA) for this  (Exception: Dizziness caused by heat exposure, sudden standing, or poor fluid intake.)  Answer Assessment - Initial Assessment Questions Pt called to report worsening dizziness since yesterday morning that has not improved with dramamine or betahistine HCl. Pt reports hx of migraines and barometric change induced h/a but states no pain with dizziness. Pt states dizziness is worse with changing direction of her head and when lying in bed. Pt has been experiencing spinning since around 4am yesterday morning. Pt reports when she closes her eyes symptoms ease but do not go away. Discussed importance of pt having transportation to ride d/t symptoms and pt agreeable. Pt's husband to bring her to appt. Appointment scheduled for evaluation. Patient agrees with plan of care, and will call back if anything changes, or if symptoms worsen.       1. DESCRIPTION: Describe your dizziness.     Worse laying down and turning head left/right   2. LIGHTHEADED: Do you feel lightheaded? (e.g., somewhat faint, woozy, weak upon standing)     Faint, woozy, spinning   3. VERTIGO: Do you feel like either you or the room is spinning or tilting? (i.e., vertigo)     Spinning when lying down   4.  SEVERITY: How bad is it?  Do you feel like you are going to faint? Can you stand and walk?     Pt is able to walk but states she has been keeping her eyes closed d/t dizziness worsening when she turns her head   5. ONSET:  When did the dizziness begin?     Yesterday morning around 4-6am   6. AGGRAVATING FACTORS: Does anything make it worse? (e.g., standing, change in head position)     Changing head position   8. CAUSE: What do you think is causing the dizziness? (e.g., decreased fluids or food, diarrhea, emotional distress, heat exposure, new medicine, sudden standing, vomiting; unknown)     Pt reports hx of barometric pressure h/a   9. RECURRENT SYMPTOM: Have you had dizziness before? If Yes, ask: When was the last time? What happened that time?     Yes; hx of barometric pressure h/a and migraines   10. OTHER SYMPTOMS: Do you have any other symptoms? (e.g., fever, chest pain, vomiting, diarrhea, bleeding)       None  Protocols used: Dizziness - Lightheadedness-A-AH

## 2024-09-05 NOTE — Patient Instructions (Signed)
" °  VISIT SUMMARY: Today we discussed your worsening dizziness, which is likely due to Benign Paroxysmal Positional Vertigo (BPPV).  YOUR PLAN: BENIGN PAROXYSMAL POSITIONAL VERTIGO (BPPV): Your symptoms are consistent with BPPV, which is caused by displaced particles in your inner ear. This condition is usually self-limiting but can persist. -Continue taking betahistine three times daily. -Refer to [facility] for vestibular rehabilitation and instruction on the Epley maneuver, which can help relieve your symptoms. -Seek emergency care if your dizziness becomes constant or is accompanied by significant ear pain. -Call [facility] to expedite scheduling your appointment.    "

## 2024-09-05 NOTE — Progress Notes (Unsigned)
" ° ° °  Subjective   Patient ID: Denise Gamble, female    DOB: [birth year]  Age: 59 y.o. MRN: [id number]  Chief Complaint  Patient presents with   Dizziness    Discussed the use of AI scribe software for clinical note transcription with the patient, who gave verbal consent to proceed.  History of Present Illness   Denise Gamble is a 59 year old female with vestibular issues who presents with worsening dizziness.  She developed severe carnival ride dizziness yesterday around 3:30 AM, worsening by 8:30 AM to the point she struggled to get out of bed. Dizziness is triggered by head movement, especially turning to the right, and by visual stimuli such as using her phone. Symptoms improve when she keeps her head straight and sits up rather than lies down.  She takes betahistine three times daily for vestibular symptoms, which had been controlled. For this episode she also took a prescription motion-sickness medication similar to Dramamine yesterday morning and again this morning before getting out of bed.  She denies tinnitus, significant ear pain, or clear hearing loss, though she briefly noticed she could not hear water in the sink as usual. She had nausea last night and pain in the back of her head. She has migraines but no slurred speech, weakness, or numbness.  Her medical history includes vestibular disease and a TIA, after which she was treated for presumed vestibular problems. She has not been instructed in maneuvers such as the Epley maneuver.          The ASCVD Risk score (Arnett DK, et al., 2019) failed to calculate for the following reasons:   Risk score cannot be calculated because patient has a medical history suggesting prior/existing ASCVD   * - Cholesterol units were assumed  Health Maintenance Due  Topic Date Due   Hepatitis B Vaccines 19-59 Average Risk (1 of 3 - 19+ 3-dose series) Never done   Pneumococcal Vaccine: 50+ Years (2 of 2 - PCV) 12/14/2017   COVID-19  Vaccine (3 - 2025-26 season) 04/10/2024      Objective:     BP 112/75   Pulse 74   Ht 5' 3 (1.6 m)   Wt 234 lb 6.4 oz (106.3 kg)   LMP 02/10/2021   SpO2 97%   BMI 41.52 kg/m  {Vitals History (Optional):23777}  Physical Exam   NEUROLOGICAL: Cranial nerves grossly intact. Normal cerebellar function. Neurological exam normal.        No results found for any visits on 09/05/24.      Assessment & Plan:   Benign paroxysmal positional vertigo of right ear -     Ambulatory referral to Physical Therapy       Return if symptoms worsen or fail to improve.    Toribio MARLA Slain, MD  "

## 2025-07-18 ENCOUNTER — Other Ambulatory Visit

## 2025-07-25 ENCOUNTER — Encounter: Admitting: Family Medicine
